# Patient Record
Sex: Male | Born: 1970 | Race: White | Hispanic: No | Marital: Married | Smoking: Former smoker
Health system: Southern US, Community
[De-identification: ages and names within clinical notes are randomized; demographics above are authoritative.]

## PROBLEM LIST (undated history)

## (undated) ENCOUNTER — Emergency Department (HOSPITAL_BASED_OUTPATIENT_CLINIC_OR_DEPARTMENT_OTHER): Admission: EM | Discharge status: Absent without leave | Payer: BC Managed Care – PPO

## (undated) DIAGNOSIS — G43909 Migraine, unspecified, not intractable, without status migrainosus: Secondary | ICD-10-CM

## (undated) DIAGNOSIS — M545 Low back pain, unspecified: Secondary | ICD-10-CM

## (undated) DIAGNOSIS — J45909 Unspecified asthma, uncomplicated: Secondary | ICD-10-CM

## (undated) DIAGNOSIS — C801 Malignant (primary) neoplasm, unspecified: Secondary | ICD-10-CM

## (undated) DIAGNOSIS — Z8619 Personal history of other infectious and parasitic diseases: Secondary | ICD-10-CM

## (undated) DIAGNOSIS — F418 Other specified anxiety disorders: Secondary | ICD-10-CM

## (undated) DIAGNOSIS — F1910 Other psychoactive substance abuse, uncomplicated: Secondary | ICD-10-CM

## (undated) DIAGNOSIS — T7840XA Allergy, unspecified, initial encounter: Secondary | ICD-10-CM

## (undated) DIAGNOSIS — I10 Essential (primary) hypertension: Secondary | ICD-10-CM

## (undated) DIAGNOSIS — J302 Other seasonal allergic rhinitis: Secondary | ICD-10-CM

## (undated) DIAGNOSIS — F32A Depression, unspecified: Secondary | ICD-10-CM

## (undated) DIAGNOSIS — Z9109 Other allergy status, other than to drugs and biological substances: Secondary | ICD-10-CM

## (undated) DIAGNOSIS — Z114 Encounter for screening for human immunodeficiency virus [HIV]: Secondary | ICD-10-CM

## (undated) DIAGNOSIS — F1010 Alcohol abuse, uncomplicated: Secondary | ICD-10-CM

## (undated) DIAGNOSIS — F419 Anxiety disorder, unspecified: Secondary | ICD-10-CM

## (undated) DIAGNOSIS — F329 Major depressive disorder, single episode, unspecified: Secondary | ICD-10-CM

## (undated) DIAGNOSIS — E786 Lipoprotein deficiency: Secondary | ICD-10-CM

## (undated) DIAGNOSIS — E785 Hyperlipidemia, unspecified: Secondary | ICD-10-CM

## (undated) DIAGNOSIS — M150 Primary generalized (osteo)arthritis: Secondary | ICD-10-CM

## (undated) HISTORY — DX: Other seasonal allergic rhinitis: J30.2

## (undated) HISTORY — DX: Essential (primary) hypertension: I10

## (undated) HISTORY — DX: Alcohol abuse, uncomplicated: F10.10

## (undated) HISTORY — DX: Major depressive disorder, single episode, unspecified: F32.9

## (undated) HISTORY — DX: Migraine, unspecified, not intractable, without status migrainosus: G43.909

## (undated) HISTORY — DX: Other psychoactive substance abuse, uncomplicated: F19.10

## (undated) HISTORY — DX: Anxiety disorder, unspecified: F41.9

## (undated) HISTORY — DX: Malignant (primary) neoplasm, unspecified: C80.1

## (undated) HISTORY — DX: Other specified anxiety disorders: F41.8

## (undated) HISTORY — PX: WISDOM TOOTH EXTRACTION: SHX21

## (undated) HISTORY — DX: Encounter for screening for human immunodeficiency virus (HIV): Z11.4

## (undated) HISTORY — DX: Depression, unspecified: F32.A

## (undated) HISTORY — PX: LUMBAR EPIDURAL INJECTION: SHX1980

## (undated) HISTORY — DX: Primary generalized (osteo)arthritis: M15.0

## (undated) HISTORY — DX: Allergy, unspecified, initial encounter: T78.40XA

## (undated) HISTORY — DX: Lipoprotein deficiency: E78.6

## (undated) HISTORY — DX: Other allergy status, other than to drugs and biological substances: Z91.09

## (undated) HISTORY — DX: Unspecified asthma, uncomplicated: J45.909

## (undated) HISTORY — DX: Hyperlipidemia, unspecified: E78.5

## (undated) HISTORY — DX: Personal history of other infectious and parasitic diseases: Z86.19

---

## 2016-11-07 ENCOUNTER — Ambulatory Visit (INDEPENDENT_AMBULATORY_CARE_PROVIDER_SITE_OTHER): Payer: BLUE CROSS/BLUE SHIELD | Admitting: Medical

## 2016-11-07 ENCOUNTER — Encounter: Payer: Self-pay | Admitting: Medical

## 2016-11-07 VITALS — BP 128/80 | HR 83 | Temp 98.0°F | Resp 16 | Ht 69.0 in | Wt 207.0 lb

## 2016-11-07 DIAGNOSIS — I10 Essential (primary) hypertension: Secondary | ICD-10-CM

## 2016-11-07 DIAGNOSIS — H547 Unspecified visual loss: Secondary | ICD-10-CM

## 2016-11-07 DIAGNOSIS — E785 Hyperlipidemia, unspecified: Secondary | ICD-10-CM

## 2016-11-07 DIAGNOSIS — Z111 Encounter for screening for respiratory tuberculosis: Secondary | ICD-10-CM | POA: Diagnosis not present

## 2016-11-07 DIAGNOSIS — F329 Major depressive disorder, single episode, unspecified: Secondary | ICD-10-CM | POA: Diagnosis not present

## 2016-11-07 DIAGNOSIS — F419 Anxiety disorder, unspecified: Secondary | ICD-10-CM | POA: Diagnosis not present

## 2016-11-07 DIAGNOSIS — F32A Depression, unspecified: Secondary | ICD-10-CM

## 2016-11-07 LAB — COMPREHENSIVE METABOLIC PANEL
ALBUMIN: 4.5 g/dL (ref 3.5–5.2)
ALT: 23 U/L (ref 0–53)
AST: 17 U/L (ref 0–37)
Alkaline Phosphatase: 68 U/L (ref 39–117)
BUN: 19 mg/dL (ref 6–23)
CHLORIDE: 102 meq/L (ref 96–112)
CO2: 31 meq/L (ref 19–32)
Calcium: 9.6 mg/dL (ref 8.4–10.5)
Creatinine, Ser: 1.02 mg/dL (ref 0.40–1.50)
GFR: 83.44 mL/min (ref >60.00)
GLUCOSE: 96 mg/dL (ref 70–99)
Potassium: 4.1 mEq/L (ref 3.5–5.1)
SODIUM: 139 meq/L (ref 135–145)
Total Bilirubin: 0.6 mg/dL (ref 0.2–1.2)
Total Protein: 7.3 g/dL (ref 6.0–8.3)

## 2016-11-07 LAB — LIPID PANEL
CHOL/HDL RATIO: 4
Cholesterol: 156 mg/dL (ref 0–200)
HDL: 35 mg/dL — ABNORMAL LOW (ref >39.00)
LDL CALC: 95 mg/dL (ref 0–99)
NonHDL: 120.5
Triglycerides: 128 mg/dL (ref 0.0–149.0)
VLDL: 25.6 mg/dL (ref 0.0–40.0)

## 2016-11-07 NOTE — Progress Notes (Signed)
Subjective:    Patient ID: George Ward, male    DOB: 04-03-1971, 46 y.o.   MRN: [id number]  HPI   Pt in for first time. He moved from [place] in December.   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 Triplets)  Htn- has been on medication for 5 years or so. Pt on diuretic. Has been 2 years since labs done.   Pt has high cholesterol- he is fasting. Has been on atorvastatin for 2 years.  Pt has history of asthma- He states rare use of ventolin. In past states more exercise induced.  Pt history of depression and anxiety. Pt states with medication and therapy has been in controlled. Pt saw psychiatrist in [place].   Pt states no diagnosis of bipolar. He has about one moth left on his prozac, seroquel and ativan.  Hx of back pain but no pain for 3 years used to get epidurals.   Pt has health exam form to be filled out for school he states. tyring to get employed as teacher,b  Review of Systems  Constitutional: Negative for chills, fatigue and fever.  Respiratory: Negative for cough and chest tightness.   Cardiovascular: Negative for chest pain.  Gastrointestinal: Negative for abdominal pain.  Musculoskeletal: Negative for back pain.  Skin: Negative for rash.  Neurological: Negative for dizziness and headaches.  Hematological: Negative for adenopathy. Does not bruise/bleed easily.  Psychiatric/Behavioral: Negative for behavioral problems, confusion, dysphoric mood, sleep disturbance and suicidal ideas. The patient is not nervous/anxious.        Mood stable and controlled.   Past Medical History:  Diagnosis Date  . Alcohol abuse    Remission >20 years ago  . Allergy   . Anxiety   . Asthma   . Depression   . Depression with anxiety   . Drug abuse    Remission >20 years ago  . Encounter for HIV (human immunodeficiency virus) test 2014-2015  . Environmental allergies   .  History of chicken pox   . Hyperlipidemia   . Hypertension   . Migraines   . Primary generalized (osteo)arthritis   . Seasonal allergies      Social History   Social History  . Marital status: Married    Spouse name: N/A  . Number of children: N/A  . Years of education: N/A   Occupational History  . Not on file.   Social History Main Topics  . Smoking status: Former Smoker    Types: Cigarettes    Quit date: 03/07/1988  . Smokeless tobacco: Former NeurosurgeonUser  . Alcohol use No  . Drug use: No  . Sexual activity: Yes   Other Topics Concern  . Not on file   Social History Narrative  . No narrative on file    Past Surgical History:  Procedure Laterality Date  . LUMBAR EPIDURAL INJECTION    . WISDOM TOOTH EXTRACTION  1989-1990    Family History  Problem Relation Age of Onset  . Hypertension Mother   . Hyperlipidemia Father   . Healthy Sister   . Colon cancer Maternal Grandfather   . Prostate cancer Maternal Grandfather   . Congestive Heart Failure Maternal Grandfather   . Dementia Paternal Grandmother   . Heart disease Paternal Grandfather   . Heart attack Paternal Grandfather   . Hypertension Maternal Uncle   . Hyperlipidemia Maternal Uncle   . Hepatitis Paternal Aunt   .  Hepatitis C Paternal Aunt   . Hypertension Paternal Uncle   . Hyperlipidemia Paternal Uncle   . Healthy Son     x1  . Healthy Daughter     x4    Allergies  Allergen Reactions  . Lisinopril Cough    With Wheezing     No current outpatient prescriptions on file prior to visit.   No current facility-administered medications on file prior to visit.     BP 128/80   Pulse 83   Temp 98 F (36.7 C) (Oral)   Resp 16   Ht 5\' 9"  (1.753 m)   Wt 207 lb (93.9 kg)   SpO2 100%   BMI 30.57 kg/m       Objective:   Physical Exam  General Mental Status- Alert. General Appearance- Not in acute distress.   Eyes- with glasses lt eye 20/50. Rt eye  20/20 both eyes 20/20.  Skin General:  Color- Normal Color. Moisture- Normal Moisture.  Neck Carotid Arteries- Normal color. Moisture- Normal Moisture. No carotid bruits. No JVD.  Chest and Lung Exam Auscultation: Breath Sounds:-Normal.  Cardiovascular Auscultation:Rythm- Regular. Murmurs & Other Heart Sounds:Auscultation of the heart reveals- No Murmurs.  Abdomen Inspection:-Inspeection Normal. Palpation/Percussion:Note:No mass. Palpation and Percussion of the abdomen reveal- Non Tender, Non Distended + BS, no rebound or guarding.    Neurologic Cranial Nerve exam:- CN III-XII intact(No nystagmus), symmetric smile. Drift Test:- No drift. Romberg Exam:- Negative.  Heal to Toe Gait exam:-Normal. Finger to Nose:- Normal/Intact Strength:- 5/5 equal and symmetric strength both upper and lower extremities.      Assessment & Plan:  For your htn(well controlled and hyperlipidemia) continue current meds. Will get cmp and lipid panel today.  For your depression and anxiety continue current meds. Referral to psychiatrist placed. List of providers given. Please contact them asap and then let Victorino Dike know which one you contacted.  Referred to optometrist. TB skin test given today.   Follow up date to be determined. Will fill out form on Friday when you get tb skin test read.  Total of 45 minutes spend with pt. In addition to time spent on chronic problems need to fill out majority of form for school PE.  Zahid Carneiro, Ramon Dredge, PA-C

## 2016-11-07 NOTE — Progress Notes (Signed)
Pre visit review using our clinic review tool, if applicable. No additional management support is needed unless otherwise documented below in the visit note/SLS  

## 2016-11-07 NOTE — Patient Instructions (Addendum)
For your htn(well controlled and hyperlipidemia) continue current meds. Will get cmp and lipid panel today.  For your depression and anxiety continue current meds. Referral to psychiatrist placed. List of providers given. Please contact them asap and then let George Ward know which one you contacted.  Referred to optometrist. TB skin test given today.   Follow up date to be determined. Will fill out form on Friday when you get tb skin test read.

## 2016-11-09 ENCOUNTER — Encounter: Payer: Self-pay | Admitting: *Deleted

## 2016-11-09 LAB — TB SKIN TEST: TB Skin Test: NEGATIVE

## 2016-12-05 ENCOUNTER — Ambulatory Visit (INDEPENDENT_AMBULATORY_CARE_PROVIDER_SITE_OTHER): Payer: BLUE CROSS/BLUE SHIELD | Admitting: Psychiatry

## 2016-12-05 VITALS — BP 122/74 | HR 89 | Ht 70.0 in | Wt 210.0 lb

## 2016-12-05 DIAGNOSIS — Z87891 Personal history of nicotine dependence: Secondary | ICD-10-CM | POA: Diagnosis not present

## 2016-12-05 DIAGNOSIS — Z810 Family history of intellectual disabilities: Secondary | ICD-10-CM | POA: Diagnosis not present

## 2016-12-05 DIAGNOSIS — Z79899 Other long term (current) drug therapy: Secondary | ICD-10-CM

## 2016-12-05 DIAGNOSIS — F3342 Major depressive disorder, recurrent, in full remission: Secondary | ICD-10-CM | POA: Diagnosis not present

## 2016-12-05 DIAGNOSIS — Z888 Allergy status to other drugs, medicaments and biological substances status: Secondary | ICD-10-CM

## 2016-12-05 DIAGNOSIS — F639 Impulse disorder, unspecified: Secondary | ICD-10-CM

## 2016-12-05 MED ORDER — FLUOXETINE HCL 40 MG PO CAPS: 80.0000 mg | ORAL_CAPSULE | Freq: Every day | ORAL | 3 refills | Status: DC

## 2016-12-05 MED ORDER — LORAZEPAM 0.5 MG PO TABS: 0.5000 mg | ORAL_TABLET | Freq: Two times a day (BID) | ORAL | 1 refills | Status: DC

## 2016-12-05 MED ORDER — QUETIAPINE FUMARATE 25 MG PO TABS: 25.0000 mg | ORAL_TABLET | Freq: Two times a day (BID) | ORAL | 3 refills | Status: DC

## 2016-12-05 NOTE — Progress Notes (Signed)
Psychiatric Initial Adult Assessment   Patient Identification: George MooreMark Ward MRN:  [id number] Date of Evaluation:  12/05/2016 Referral Source: self Chief Complaint:  depression, med management Visit Diagnosis:    ICD-9-CM ICD-10-CM   1. Recurrent major depressive disorder, in full remission (HCC) 296.36 F33.42   2. 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 He describes childhood history consistent with ADHD. Today for psychiatric intake assessment.  He reports that he and his wife, and their 5 children recently moved here from [place]. They moved in December 2017, and this was due to extremely stressful circumstances related to the patient, his wife, and his job in [place]. The patient was having an extramarital affair for 2 years leading up to summer 2017. 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 When this came to light, there were significant marital consequences, and fractured trust, in addition to significant consequences from the school. He was suspended from his job in October 2017. Eventually the school reached a settlement to allow him to resign in February 2018, but his teacher's license is currently still up for review, and may potentially be revoked in the future.  The patient reports that this is the second time he has had an extramarital affair, and the last time was in 2008. He reports that the marital affairs were in the setting of curiosity, boredom, and a sense that he might be able to get away with having this type of "adventure". He reports that his marriage with his wife has always been fairly good, and they have a good sexual and emotional relationship. They have never had any open agreements to allow each other to explore  other individuals sexually, and he knew what he was doing was going to be heartbreaking. He reports that this most recent affair, he thought that he was in love with that woman. Ultimately, he realizes that he made a series of very poor decisions over the past couple years. He expresses embarrassment and regret for his behavior, but specifically is more regretful of the fact that there were consequences that came about from his behavior. He speaks very little about the heartbreak that this caused his wife, to his children, and how this affected his relationship with his family.    He reports that he has struggled with depressive symptoms and behavioral acting out symptoms since he was a teenager. He reports that he always got very good grades in school, but he was also the class clown, liked to skip class, did drugs with his friends outside of school, got in frequent arguments and fights with his parents. He reports that his parents are very mild mannered, and generally kind people. He reports that they have a close relationship, but his parents remain in [place]. Moving here to [place] has been very difficult with him being away from his parents. Here in [place], the patient is developing more of a relationship with his wife's family, as she is from [place].  He denies any current significant depressive symptoms. He feels that his dose of Prozac 80 mg is effective for his mood symptoms. He uses Ativan 0.5 mg and Seroquel 25 mg at night for sleep. He has a morning dose to use as needed for both of those medications.  He denies any suicidal thoughts, but admits that back in October and November he was in a dark place, and wanted to just disappear from the earth. He never came up with any specific plan of how he would kill himself. He denies any such thoughts currently. He is more hopeful that his marriage may be okay, and he can repair the damage that's been done to his family. He and his wife  and 5 children are currently living with his sister-in-law and brother-in-law. The relationship with them has been strained, but it has been better than what he anticipated, given the circumstances. He and his wife and family are closing on a house in April, so will be moving out, but remaining in the [place] area.  Regarding work, the patient currently works as a Lawyer for [employer]. He is hopeful that he can find a full-time position, but eventually the outcome of his teacher license from [place], if negative, would preclude this. 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 He reports that his energy and ability to engage in his job recently has remained intact with the medicines that he is on. He reports that he sleeps well at night. His appetite is up and down, but part of this is related to moving to a new city and being near new food.  I spent time with the patient learning about his history of impulsivity, risk-taking behaviors. He admits that he has wondered if he has ADHD in the past. His son has ADHD, and is on medication for this. He has never been on any stimulant, or atypical medicine for ADHD such as Wellbutrin. He is open to considering these options in the future. He would like to establish therapy in this clinic. He is open to considering group therapy. He has previously engaged in intensive outpatient therapy in [place] with his previous psychiatrist office. He has never had any psychiatric hospitalizations. He does not engage in any substance use since college.  Associated Signs/Symptoms: Depression Symptoms:  in remission (Hypo) Manic Symptoms:  Impulsivity, Anxiety Symptoms:  none Psychotic Symptoms:  none PTSD Symptoms: Negative  Past Psychiatric History: Psychiatric history of outpatient treatment, intensive  outpatient treatment, and medication management. He had both individual therapy, and family therapy since childhood, and engaged in therapy in 2008, and most recently in 2016 to now  Previous Psychotropic Medications: Yes   Substance Abuse History in the last 12 months:  No.  Consequences of Substance Abuse: Negative  Past Medical History:  Past Medical History:  Diagnosis Date  . Alcohol abuse    Remission >20 years ago  . Allergy   . Anxiety   . Asthma   . Depression    MAJOR DEPRESSIVE DISORDER  . Depression with anxiety   . Drug abuse    Remission >20 years ago  . Encounter for HIV (human immunodeficiency virus) test 2014-2015  . Environmental allergies   . History of chicken pox   . Hyperlipidemia   . Hypertension   . Hypoalphalipoproteinemia, familial   . Migraines   . Primary generalized (osteo)arthritis   . Seasonal allergies     Past Surgical History:  Procedure Laterality Date  . LUMBAR EPIDURAL INJECTION    . WISDOM TOOTH EXTRACTION  1989-1990    Family Psychiatric History: none  Family History:  Family History  Problem Relation Age  of Onset  . Hypertension Mother   . Hyperlipidemia Father   . Healthy Sister   . Colon cancer Maternal Grandfather   . Prostate cancer Maternal Grandfather   . Congestive Heart Failure Maternal Grandfather   . Dementia Paternal Grandmother   . Heart disease Paternal Grandfather   . Heart attack Paternal Grandfather   . Hypertension Maternal Uncle   . Hyperlipidemia Maternal Uncle   . Hepatitis Paternal Aunt   . Hepatitis C Paternal Aunt   . Hypertension Paternal Uncle   . Hyperlipidemia Paternal Uncle   . Healthy Son     x1  . Healthy Daughter     x4    Social History:   Social History   Social History  . Marital status: Married    Spouse name: N/A  . Number of children: N/A  . Years of education: N/A   Social History Main Topics  . Smoking status: Former Smoker    Types: Cigarettes    Quit date:  03/07/1988  . Smokeless tobacco: Former Neurosurgeon  . Alcohol use No  . Drug use: No  . Sexual activity: Yes   Other Topics Concern  . Not on file   Social History Narrative  . No narrative on file    Additional Social History: Currently working as a Lawyer for [employer].  He has twin 36 year olds, and 47 year old triplets  Allergies:   Allergies  Allergen Reactions  . Co-Trimoxazole [Sulfamethoxazole-Trimethoprim] Nausea And Vomiting  . Lisinopril Cough    With Wheezing   . Lisinopril-Hydrochlorothiazide Other (See Comments) and Cough    MEDIUM SEVERITY    Metabolic Disorder Labs: No results found for: HGBA1C, MPG No results found for: PROLACTIN Lab Results  Component Value Date   CHOL 156 11/07/2016   TRIG 128.0 11/07/2016   HDL 35.00 (L) 11/07/2016   CHOLHDL 4 11/07/2016   VLDL 25.6 11/07/2016   LDLCALC 95 11/07/2016     Current Medications: Current Outpatient Prescriptions  Medication Sig Dispense Refill  . albuterol (PROVENTIL HFA;VENTOLIN HFA) 108 (90 Base) MCG/ACT inhaler Inhale 1-2 puffs into the lungs every 6 (six) hours as needed for wheezing or shortness of breath. Ventolin    . atorvastatin (LIPITOR) 20 MG tablet Take 20 mg by mouth daily.    Marland Kitchen FLUoxetine (PROZAC) 40 MG capsule Take 2 capsules (80 mg total) by mouth daily. 60 capsule 3  . LORazepam (ATIVAN) 0.5 MG tablet Take 1 tablet (0.5 mg total) by mouth 2 (two) times daily. 30 tablet 1  . QUEtiapine (SEROQUEL) 25 MG tablet Take 1 tablet (25 mg total) by mouth 2 (two) times daily. 60 tablet 3  . SUMAtriptan (IMITREX) 50 MG tablet Take 50 mg by mouth every 2 (two) hours as needed for migraine. May repeat in 2 hours if headache persists or recurs.    . triamterene-hydrochlorothiazide (MAXZIDE-25) 37.5-25 MG tablet Take 1 tablet by mouth daily.     No current facility-administered medications for this visit.     Neurologic: Headache: Negative Seizure:  Negative Paresthesias:Negative  Musculoskeletal: Strength & Muscle Tone: within normal limits Gait & Station: normal Patient leans: N/A  Psychiatric Specialty Exam: Review of Systems  Constitutional: Negative.   HENT: Negative.   Respiratory: Negative.   Cardiovascular: Negative.   Gastrointestinal: Negative.   Musculoskeletal: Negative.   Neurological: Negative.   Psychiatric/Behavioral: Negative.     Blood pressure 122/74, pulse 89, height 5\' 10"  (1.778 m), weight 210 lb (95.3 kg).Body mass index is 30.13 kg/m.  General Appearance: Casual and Fairly Groomed  Eye Contact:  Good  Speech:  Clear and Coherent  Volume:  Normal  Mood:  Euthymic  Affect:  Appropriate  Thought Process:  Coherent  Orientation:  Full (Time, Place, and Person)  Thought Content:  Logical  Suicidal Thoughts:  No  Homicidal Thoughts:  No  Memory:  Recent;   Good  Judgement:  Fair  Insight:  Shallow  Psychomotor Activity:  Normal  Concentration:  Concentration: Good  Recall:  NA  Fund of Knowledge:Good  Language: Good  Akathisia:  Negative  Handed:  Right  AIMS (if indicated):  na  Assets:  Communication Skills Desire for Improvement Financial Resources/Insurance Housing Physical Health Social Support Transportation Vocational/Educational  ADL's:  Intact  Cognition: WNL  Sleep:  8-9 hours   Treatment Plan Summary: Taksh Hjort is a 46 year old male with a long-standing psychiatric history of impulsivity since childhood, and a psychiatric history of major depressive disorder in adulthood. He presents today for psychiatric intake to establish care. 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 His teaching license currently remains under review and may potentially be revoked in the future. He  currently works as a Lawyer for the [employer] system.  He presents a history of impulsivity, and purposeful violation of the trust of others, but does seem to express remorse for his actions.  During our interview, he is more focused on the negative consequences, rather than the harm and hurt that he caused his family.  I wonder about characterologic or personality disordered features contributing to his presentation.  His mood symptoms have responded well to the current regimen. I wonder if he would benefit in the future from a medication such as an alpha modulator to reduce his impulsivity.  We will continue his current regimen for now, as we further understand his needs, and assist him and setting up therapy care.  1. Recurrent major depressive disorder, in full remission (HCC)   2. 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

## 2016-12-11 ENCOUNTER — Encounter: Payer: Self-pay | Admitting: Medical

## 2016-12-11 MED ORDER — ATORVASTATIN CALCIUM 20 MG PO TABS: 20.0000 mg | ORAL_TABLET | Freq: Every day | ORAL | 2 refills | Status: DC

## 2016-12-11 MED ORDER — TRIAMTERENE-HCTZ 37.5-25 MG PO TABS: 1.0000 | ORAL_TABLET | Freq: Every day | ORAL | 2 refills | Status: DC

## 2016-12-17 ENCOUNTER — Encounter: Payer: Self-pay | Admitting: Medical

## 2016-12-20 ENCOUNTER — Ambulatory Visit (INDEPENDENT_AMBULATORY_CARE_PROVIDER_SITE_OTHER): Payer: BLUE CROSS/BLUE SHIELD | Admitting: Medical

## 2016-12-20 ENCOUNTER — Encounter: Payer: Self-pay | Admitting: Medical

## 2016-12-20 VITALS — BP 127/91 | HR 97 | Temp 98.1°F | Resp 16 | Ht 69.0 in | Wt 204.6 lb

## 2016-12-20 DIAGNOSIS — L089 Local infection of the skin and subcutaneous tissue, unspecified: Secondary | ICD-10-CM

## 2016-12-20 DIAGNOSIS — L989 Disorder of the skin and subcutaneous tissue, unspecified: Secondary | ICD-10-CM

## 2016-12-20 MED ORDER — DOXYCYCLINE HYCLATE 100 MG PO TABS: 100.0000 mg | ORAL_TABLET | Freq: Two times a day (BID) | ORAL | 0 refills | Status: DC

## 2016-12-20 NOTE — Progress Notes (Signed)
Subjective:    Patient ID: George Ward, male    DOB: December 22, 1970, 46 y.o.   MRN: [id number]  HPI  Pt has history of skin lesion in 2014 which was cryofreezed. The area got better. But just recently over past few months lesion started to reappear.. Larger than originally and is tender. 4-5 moths since lesion reappeared and it is tender for 2-3 months.    Review of Systems  Constitutional: Negative for chills, fatigue and fever.  Respiratory: Negative for cough, chest tightness, shortness of breath and wheezing.   Cardiovascular: Negative for chest pain and palpitations.  Musculoskeletal: Negative for back pain.  Skin: Negative for rash.       See hpi.  Hematological: Negative for adenopathy. Does not bruise/bleed easily.  Psychiatric/Behavioral: Negative for behavioral problems and confusion.   Past Medical History:  Diagnosis Date  . Alcohol abuse    Remission >20 years ago  . Allergy   . Anxiety   . Asthma   . Depression    MAJOR DEPRESSIVE DISORDER  . Depression with anxiety   . Drug abuse    Remission >20 years ago  . Encounter for HIV (human immunodeficiency virus) test 2014-2015  . Environmental allergies   . History of chicken pox   . Hyperlipidemia   . Hypertension   . Hypoalphalipoproteinemia, familial   . Migraines   . Primary generalized (osteo)arthritis   . Seasonal allergies      Social History   Social History  . Marital status: Married    Spouse name: N/A  . Number of children: N/A  . Years of education: N/A   Occupational History  . Not on file.   Social History Main Topics  . Smoking status: Former Smoker    Types: Cigarettes    Quit date: 03/07/1988  . Smokeless tobacco: Former Neurosurgeon  . Alcohol use No  . Drug use: No  . Sexual activity: Yes   Other Topics Concern  . Not on file   Social History Narrative  . No narrative on file    Past Surgical History:  Procedure Laterality Date  . LUMBAR EPIDURAL INJECTION    . WISDOM TOOTH  EXTRACTION  1989-1990    Family History  Problem Relation Age of Onset  . Hypertension Mother   . Hyperlipidemia Father   . Healthy Sister   . Colon cancer Maternal Grandfather   . Prostate cancer Maternal Grandfather   . Congestive Heart Failure Maternal Grandfather   . Dementia Paternal Grandmother   . Heart disease Paternal Grandfather   . Heart attack Paternal Grandfather   . Hypertension Maternal Uncle   . Hyperlipidemia Maternal Uncle   . Hepatitis Paternal Aunt   . Hepatitis C Paternal Aunt   . Hypertension Paternal Uncle   . Hyperlipidemia Paternal Uncle   . Healthy Son     x1  . Healthy Daughter     x4    Allergies  Allergen Reactions  . Co-Trimoxazole [Sulfamethoxazole-Trimethoprim] Nausea And Vomiting  . Lisinopril Cough    With Wheezing   . Lisinopril-Hydrochlorothiazide Other (See Comments) and Cough    MEDIUM SEVERITY    Current Outpatient Prescriptions on File Prior to Visit  Medication Sig Dispense Refill  . albuterol (PROVENTIL HFA;VENTOLIN HFA) 108 (90 Base) MCG/ACT inhaler Inhale 1-2 puffs into the lungs every 6 (six) hours as needed for wheezing or shortness of breath. Ventolin    . atorvastatin (LIPITOR) 20 MG tablet Take 1 tablet (20 mg total) by  mouth daily. 30 tablet 2  . FLUoxetine (PROZAC) 40 MG capsule Take 2 capsules (80 mg total) by mouth daily. 60 capsule 3  . LORazepam (ATIVAN) 0.5 MG tablet Take 1 tablet (0.5 mg total) by mouth 2 (two) times daily. 30 tablet 1  . QUEtiapine (SEROQUEL) 25 MG tablet Take 1 tablet (25 mg total) by mouth 2 (two) times daily. 60 tablet 3  . SUMAtriptan (IMITREX) 50 MG tablet Take 50 mg by mouth every 2 (two) hours as needed for migraine. May repeat in 2 hours if headache persists or recurs.    . triamterene-hydrochlorothiazide (MAXZIDE-25) 37.5-25 MG tablet Take 1 tablet by mouth daily. 30 tablet 2   No current facility-administered medications on file prior to visit.     BP (!) 127/91 (BP Location: Left  Arm, Patient Position: Sitting, Cuff Size: Large)   Pulse 97   Temp 98.1 F (36.7 C) (Oral)   Resp 16   Ht  (1.753 m)   Wt 204 lb 9.6 oz (92.8 kg)   SpO2 99%   BMI 30.21 kg/m       Objective:   Physical Exam   General- No acute distress. Pleasant patient. Lungs- Clear, even and unlabored. Heart- regular rate and rhythm. Neurologic- CNII- XII grossly intact.  Skin- Rt side of face and lateral to nose. 12mm wide. Dark pigmented and slight tender. Slight irregular appearance to borderl      Assessment & Plan:  For skin infection will rx doxycycline antibiotic. Rx advisement given.  For hx of skin lesion and reoccurrence will go ahead and refer you to derm. I want you to call Victorino Dike in 7 days if no one has called you with appointment.  Follow up with me in 2-3 weeks if by chance dermatologist referral falls through.(sooner if needed as well)  Cas Tracz, Ramon Dredge, [employer]

## 2016-12-20 NOTE — Progress Notes (Signed)
Pre visit review using our clinic review tool, if applicable. No additional management support is needed unless otherwise documented below in the visit note. 

## 2016-12-20 NOTE — Patient Instructions (Signed)
For skin infection will rx doxycycline antibiotic. Rx advisement given.  For hx of skin lesion and reoccurrence will go ahead and refer you to derm. I want you to call Victorino Dike in 7 days if no one has called you with appointment.  Follow up with me in 2-3 weeks if by chance dermatologist referral falls through.(sooner if needed as well)

## 2016-12-26 ENCOUNTER — Ambulatory Visit (HOSPITAL_COMMUNITY): Payer: Self-pay | Admitting: Clinical

## 2017-01-10 ENCOUNTER — Ambulatory Visit (HOSPITAL_COMMUNITY): Payer: Self-pay | Admitting: Psychiatry

## 2017-01-17 ENCOUNTER — Encounter (HOSPITAL_COMMUNITY): Payer: Self-pay | Admitting: Psychiatry

## 2017-01-17 ENCOUNTER — Other Ambulatory Visit (HOSPITAL_COMMUNITY): Payer: Self-pay

## 2017-01-17 MED ORDER — LORAZEPAM 0.5 MG PO TABS: 0.5000 mg | ORAL_TABLET | Freq: Two times a day (BID) | ORAL | 1 refills | Status: DC

## 2017-01-22 ENCOUNTER — Ambulatory Visit (HOSPITAL_COMMUNITY): Payer: Self-pay | Admitting: Clinical

## 2017-02-14 ENCOUNTER — Ambulatory Visit (HOSPITAL_COMMUNITY): Payer: Self-pay | Admitting: Clinical

## 2017-02-27 ENCOUNTER — Ambulatory Visit (INDEPENDENT_AMBULATORY_CARE_PROVIDER_SITE_OTHER): Payer: BLUE CROSS/BLUE SHIELD | Admitting: Clinical

## 2017-02-27 ENCOUNTER — Encounter (HOSPITAL_COMMUNITY): Payer: Self-pay | Admitting: Clinical

## 2017-02-27 DIAGNOSIS — F3130 Bipolar disorder, current episode depressed, mild or moderate severity, unspecified: Secondary | ICD-10-CM
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
## 2017-03-06 ENCOUNTER — Encounter (HOSPITAL_COMMUNITY): Payer: Self-pay | Admitting: Psychiatry

## 2017-03-06 ENCOUNTER — Ambulatory Visit (INDEPENDENT_AMBULATORY_CARE_PROVIDER_SITE_OTHER): Payer: BLUE CROSS/BLUE SHIELD | Admitting: Psychiatry

## 2017-03-06 VITALS — BP 122/78 | HR 83 | Ht 69.0 in | Wt 209.4 lb

## 2017-03-06 DIAGNOSIS — F329 Major depressive disorder, single episode, unspecified: Secondary | ICD-10-CM | POA: Insufficient documentation

## 2017-03-06 DIAGNOSIS — G4700 Insomnia, unspecified: Secondary | ICD-10-CM

## 2017-03-06 DIAGNOSIS — F410 Panic disorder [episodic paroxysmal anxiety] without agoraphobia: Secondary | ICD-10-CM

## 2017-03-06 DIAGNOSIS — Z87891 Personal history of nicotine dependence: Secondary | ICD-10-CM | POA: Diagnosis not present

## 2017-03-06 DIAGNOSIS — F639 Impulse disorder, unspecified: Secondary | ICD-10-CM | POA: Insufficient documentation

## 2017-03-06 DIAGNOSIS — F3341 Major depressive disorder, recurrent, in partial remission: Secondary | ICD-10-CM | POA: Diagnosis not present

## 2017-03-06 DIAGNOSIS — F3342 Major depressive disorder, recurrent, in full remission: Secondary | ICD-10-CM | POA: Insufficient documentation

## 2017-03-06 DIAGNOSIS — F419 Anxiety disorder, unspecified: Secondary | ICD-10-CM

## 2017-03-06 DIAGNOSIS — Z79899 Other long term (current) drug therapy: Secondary | ICD-10-CM

## 2017-03-06 MED ORDER — QUETIAPINE FUMARATE 50 MG PO TABS
100.0000 mg | ORAL_TABLET | Freq: Every day | ORAL | 2 refills | Status: DC
Start: 2017-03-06 — End: 2017-08-13

## 2017-03-06 MED ORDER — FLUOXETINE HCL 40 MG PO CAPS: 80.0000 mg | ORAL_CAPSULE | Freq: Every day | ORAL | 3 refills | Status: DC

## 2017-03-06 MED ORDER — LORAZEPAM 0.5 MG PO TABS: 0.5000 mg | ORAL_TABLET | Freq: Two times a day (BID) | ORAL | 1 refills | Status: DC
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
## 2017-03-21 ENCOUNTER — Other Ambulatory Visit: Payer: Self-pay | Admitting: Medical

## 2017-03-25 ENCOUNTER — Encounter: Payer: Self-pay | Admitting: Medical

## 2017-03-25 MED ORDER — ATORVASTATIN CALCIUM 20 MG PO TABS: 20.0000 mg | ORAL_TABLET | Freq: Every day | ORAL | 0 refills | Status: DC

## 2017-04-06 ENCOUNTER — Other Ambulatory Visit: Payer: Self-pay | Admitting: Medical

## 2017-04-18 ENCOUNTER — Other Ambulatory Visit: Payer: Self-pay | Admitting: Medical

## 2017-04-19 NOTE — Telephone Encounter (Signed)
Patient scheduled with PCP for 04/24/2017

## 2017-04-19 NOTE — Telephone Encounter (Signed)
Pt due for follow up please call and schedule appointment.  

## 2017-04-24 ENCOUNTER — Ambulatory Visit: Payer: Self-pay | Admitting: Medical

## 2017-04-25 ENCOUNTER — Ambulatory Visit (INDEPENDENT_AMBULATORY_CARE_PROVIDER_SITE_OTHER): Payer: BLUE CROSS/BLUE SHIELD | Admitting: Medical

## 2017-04-25 VITALS — BP 127/91 | HR 86 | Temp 98.3°F | Resp 16 | Ht 69.0 in | Wt 206.0 lb

## 2017-04-25 DIAGNOSIS — E785 Hyperlipidemia, unspecified: Secondary | ICD-10-CM | POA: Diagnosis not present

## 2017-04-25 NOTE — Progress Notes (Signed)
Subjective:    Patient ID: George Ward, male    DOB: Apr 16, 1971, 46 y.o.   MRN: [id number]  HPI  Pt in for follow up.  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 Pt states first day of school May 06, 2017. 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  Pt just jointed gym. His cholesterol has been controlled. His hdl was low in past. On lipitor 20 mg a day. Lipid last checked 5 month ago.  Pt is seeing Dr Rene Kocher and pt states he like psychiatrist. He is trying to see therapist. Pt states mood good/stable.   Review of Systems  HENT: Negative for congestion, ear discharge and ear pain.   Cardiovascular: Negative for chest pain.  Gastrointestinal: Negative for abdominal distention, anal bleeding and blood in stool.  Genitourinary: Negative for decreased urine volume, discharge, dysuria, flank pain, frequency, hematuria, penile pain, penile swelling, testicular pain and urgency.  Musculoskeletal: Negative for back pain, joint swelling, myalgias and neck pain.  Skin: Negative for pallor and rash.  Hematological: Negative for adenopathy.  Psychiatric/Behavioral: Negative for behavioral problems, confusion, dysphoric mood, self-injury and suicidal ideas. The patient is not nervous/anxious.     Past Medical History:  Diagnosis Date  . Alcohol abuse    Remission >20 years ago  . Allergy   . Anxiety   . Asthma   . Depression    MAJOR DEPRESSIVE DISORDER  . Depression with anxiety   . Drug abuse    Remission >20 years ago  . Encounter for HIV (human immunodeficiency virus) test 2014-2015  . Environmental allergies   . History of chicken pox   . Hyperlipidemia   . Hypertension   . Hypoalphalipoproteinemia, familial   . Migraines   . Primary generalized (osteo)arthritis   . Seasonal allergies      Social History   Social History  . Marital status: Married    Spouse name: N/A  . Number of children: N/A  . Years of education: N/A    Occupational History  . Not on file.   Social History Main Topics  . Smoking status: Former Smoker    Types: Cigarettes    Quit date: 03/07/1988  . Smokeless tobacco: Former Neurosurgeon  . Alcohol use No  . Drug use: No  . Sexual activity: Yes   Other Topics Concern  . Not on file   Social History Narrative  . No narrative on file    Past Surgical History:  Procedure Laterality Date  . LUMBAR EPIDURAL INJECTION    . WISDOM TOOTH EXTRACTION  1989-1990    Family History  Problem Relation Age of Onset  . Hypertension Mother   . Hyperlipidemia Father   . Healthy Sister   . Colon cancer Maternal Grandfather   . Prostate cancer Maternal Grandfather   . Congestive Heart Failure Maternal Grandfather   . Dementia Paternal Grandmother   . Heart disease Paternal Grandfather   . Heart attack Paternal Grandfather   . Hypertension Maternal Uncle   . Hyperlipidemia Maternal Uncle   . Hepatitis Paternal Aunt   . Hepatitis C Paternal Aunt   . Hypertension Paternal Uncle   . Hyperlipidemia Paternal Uncle   . Healthy Son        x1  . Healthy Daughter        x4    Allergies  Allergen Reactions  . Co-Trimoxazole [Sulfamethoxazole-Trimethoprim] Nausea And Vomiting  . Lisinopril Cough    With  Wheezing   . Lisinopril-Hydrochlorothiazide Other (See Comments) and Cough    MEDIUM SEVERITY    Current Outpatient Prescriptions on File Prior to Visit  Medication Sig Dispense Refill  . albuterol (PROVENTIL HFA;VENTOLIN HFA) 108 (90 Base) MCG/ACT inhaler Inhale 1-2 puffs into the lungs every 6 (six) hours as needed for wheezing or shortness of breath. Ventolin    . atorvastatin (LIPITOR) 20 MG tablet TAKE 1 TABLET(20 MG) BY MOUTH DAILY 30 tablet 0  . FLUoxetine (PROZAC) 40 MG capsule Take 2 capsules (80 mg total) by mouth daily. 60 capsule 3  . LORazepam (ATIVAN) 0.5 MG tablet Take 1 tablet (0.5 mg total) by mouth 2 (two) times daily. 60 tablet 1  . QUEtiapine (SEROQUEL) 50 MG tablet Take  2 tablets (100 mg total) by mouth at bedtime. 60 tablet 2  . SUMAtriptan (IMITREX) 50 MG tablet Take 50 mg by mouth every 2 (two) hours as needed for migraine. May repeat in 2 hours if headache persists or recurs.    . triamterene-hydrochlorothiazide (MAXZIDE-25) 37.5-25 MG tablet TAKE 1 TABLET BY MOUTH DAILY 30 tablet 0   No current facility-administered medications on file prior to visit.     BP (!) 127/91   Pulse 86   Temp 98.3 F (36.8 C) (Oral)   Resp 16   Ht 5\' 9"  (1.753 m)   Wt 206 lb (93.4 kg)   SpO2 98%   BMI 30.42 kg/m       Objective:   Physical Exam  General Mental Status- Alert. General Appearance- Not in acute distress.   Skin General: Color- Normal Color. Moisture- Normal Moisture.  Neck Carotid Arteries- Normal color. Moisture- Normal Moisture. No carotid bruits. No JVD.  Chest and Lung Exam Auscultation: Breath Sounds:-Normal.  Cardiovascular Auscultation:Rythm- Regular. Murmurs & Other Heart Sounds:Auscultation of the heart reveals- No Murmurs.   Neurologic Cranial Nerve exam:- CN III-XII intact(No nystagmus), symmetric smile. Strength:- 5/5 equal and symmetric strength both upper and lower extremities.      Assessment & Plan:  For high cholesterol hx will put in cmp and lipid panel order today. Can get tomorrow in am fasting.  For depression and mood continue to see psychiatrist.  Follow up in 3-6 months or as needed.  Can schedule nurse flu vaccine visit in October.   Paton Crum, Ramon DredgeEdward, PA-C

## 2017-04-25 NOTE — Patient Instructions (Signed)
For high cholesterol hx will put in cmp and lipid panel order today. Can get tomorrow in am fasting.  For depression and mood continue to see psychiatrist.  Follow up in 3-6 months or as needed.  Can schedule nurse flu vaccine visit in October.

## 2017-04-26 ENCOUNTER — Other Ambulatory Visit (INDEPENDENT_AMBULATORY_CARE_PROVIDER_SITE_OTHER): Payer: BLUE CROSS/BLUE SHIELD

## 2017-04-26 DIAGNOSIS — E785 Hyperlipidemia, unspecified: Secondary | ICD-10-CM

## 2017-04-26 LAB — COMPREHENSIVE METABOLIC PANEL
ALT: 26 U/L (ref 0–53)
AST: 23 U/L (ref 0–37)
Albumin: 4.2 g/dL (ref 3.5–5.2)
Alkaline Phosphatase: 66 U/L (ref 39–117)
BUN: 20 mg/dL (ref 6–23)
CHLORIDE: 102 meq/L (ref 96–112)
CO2: 33 mEq/L — ABNORMAL HIGH (ref 19–32)
CREATININE: 1.15 mg/dL (ref 0.40–1.50)
Calcium: 9.6 mg/dL (ref 8.4–10.5)
GFR: 72.51 mL/min (ref >60.00)
Glucose, Bld: 99 mg/dL (ref 70–99)
Potassium: 3.9 mEq/L (ref 3.5–5.1)
SODIUM: 141 meq/L (ref 135–145)
Total Bilirubin: 0.9 mg/dL (ref 0.2–1.2)
Total Protein: 6.6 g/dL (ref 6.0–8.3)

## 2017-04-26 LAB — LIPID PANEL
CHOL/HDL RATIO: 5
CHOLESTEROL: 170 mg/dL (ref 0–200)
HDL: 35.2 mg/dL — ABNORMAL LOW (ref >39.00)
LDL CALC: 100 mg/dL — AB (ref 0–99)
NONHDL: 134.43
Triglycerides: 171 mg/dL — ABNORMAL HIGH (ref 0.0–149.0)
VLDL: 34.2 mg/dL (ref 0.0–40.0)

## 2017-04-27 ENCOUNTER — Telehealth: Payer: Self-pay | Admitting: Medical

## 2017-04-27 MED ORDER — ATORVASTATIN CALCIUM 20 MG PO TABS: ORAL_TABLET | ORAL | 1 refills | Status: DC

## 2017-04-27 NOTE — Telephone Encounter (Signed)
rx refill of lipitor sent in.

## 2017-05-02 ENCOUNTER — Other Ambulatory Visit: Payer: Self-pay | Admitting: Medical

## 2017-05-07 ENCOUNTER — Ambulatory Visit (HOSPITAL_COMMUNITY): Payer: Self-pay | Admitting: Clinical

## 2017-05-27 ENCOUNTER — Other Ambulatory Visit: Payer: Self-pay | Admitting: Medical

## 2017-06-06 ENCOUNTER — Ambulatory Visit (HOSPITAL_COMMUNITY): Payer: Self-pay | Admitting: Psychiatry

## 2017-06-24 ENCOUNTER — Other Ambulatory Visit: Payer: Self-pay | Admitting: Medical

## 2017-06-28 ENCOUNTER — Emergency Department (HOSPITAL_BASED_OUTPATIENT_CLINIC_OR_DEPARTMENT_OTHER)
Admission: EM | Admit: 2017-06-28 | Discharge: 2017-06-28 | Disposition: A | Discharge status: Home / Self Care | Payer: BLUE CROSS/BLUE SHIELD | Attending: Emergency Medicine | Admitting: Emergency Medicine

## 2017-06-28 ENCOUNTER — Encounter (HOSPITAL_BASED_OUTPATIENT_CLINIC_OR_DEPARTMENT_OTHER): Payer: Self-pay | Admitting: Emergency Medicine

## 2017-06-28 ENCOUNTER — Other Ambulatory Visit: Payer: Self-pay

## 2017-06-28 ENCOUNTER — Emergency Department (HOSPITAL_BASED_OUTPATIENT_CLINIC_OR_DEPARTMENT_OTHER): Payer: BLUE CROSS/BLUE SHIELD

## 2017-06-28 DIAGNOSIS — M79672 Pain in left foot: Secondary | ICD-10-CM

## 2017-06-28 DIAGNOSIS — Z79899 Other long term (current) drug therapy: Secondary | ICD-10-CM | POA: Diagnosis not present

## 2017-06-28 DIAGNOSIS — J45909 Unspecified asthma, uncomplicated: Secondary | ICD-10-CM | POA: Insufficient documentation

## 2017-06-28 DIAGNOSIS — Z87891 Personal history of nicotine dependence: Secondary | ICD-10-CM | POA: Insufficient documentation

## 2017-06-28 DIAGNOSIS — I10 Essential (primary) hypertension: Secondary | ICD-10-CM | POA: Diagnosis not present

## 2017-06-28 MED ORDER — NAPROXEN 500 MG PO TABS: 500.0000 mg | ORAL_TABLET | Freq: Two times a day (BID) | ORAL | 0 refills | Status: DC

## 2017-06-28 MED ORDER — NAPROXEN 250 MG PO TABS
ORAL_TABLET | ORAL | Status: AC
  Administered 2017-06-28: 500 mg
  Filled 2017-06-28: qty 2

## 2017-06-28 NOTE — ED Triage Notes (Signed)
Patient states that he had dress shoes on and had a bookcase fall onto his left foot. The patient reports that it is swollen today

## 2017-06-28 NOTE — Discharge Instructions (Signed)
Please read and follow all provided instructions.  You have been seen today for left foot pain  Tests performed today include: An x-ray of the affected area - does NOT show any broken bones or dislocations.  Vital signs. See below for your results today.   Home care instructions: -- *PRICE in the first 24-48 hours after injury: Protect (with brace, splint, sling), if given by your provider Rest Ice- Do not apply ice pack directly to your skin, place towel or similar between your skin and ice/ice pack. Apply ice for 20 min, then remove for 40 min while awake Compression- Wear brace, elastic bandage, splint as directed by your provider Elevate affected extremity above the level of your heart when not walking around for the first 24-48 hours   Use Naproxen with food as directed for pain.   Follow-up instructions: Please follow-up with your primary care provider or the provided orthopedic physician (bone specialist) if you continue to have significant pain in 1 week. In this case you may have a more severe injury that requires further care.   Return instructions:  Please return if your toes or feet are numb or tingling, appear gray or blue, or you have severe pain (also elevate the leg and loosen splint or wrap if you were given one) Please return to the Emergency Department if you experience worsening symptoms.  Please return if you have any other emergent concerns. Additional Information:  Your vital signs today were: BP (!) 142/90 (BP Location: Left Arm)    Pulse 84    Temp 98.7 F (37.1 C) (Oral)    Resp 18    Ht 5\' 10"  (1.778 m)    Wt 88.5 kg (195 lb)    SpO2 98%    BMI 27.98 kg/m  If your blood pressure (BP) was elevated above 135/85 this visit, please have this repeated by your doctor within one month. ---------------

## 2017-06-28 NOTE — ED Provider Notes (Signed)
[facility] EMERGENCY DEPARTMENT Provider Note   CSN: [id number] Arrival date & time: 06/28/17  1804     History   Chief Complaint Chief Complaint  Patient presents with  . Foot Pain    HPI George Ward is a 46 y.o. male presents to the emergency department today for left foot pain 1 day. Patient states that he was making a bookcase and it fell on his left foot. He is now having pain proximal to the first digit on his left foot. Pain is constant and worse with ambulation. Has taken ibuprofen for this with mild relief. Patient denies open wound, numbness, tingling or weakness.   HPI  Past Medical History:  Diagnosis Date  . Alcohol abuse    Remission >20 years ago  . Allergy   . Anxiety   . Asthma   . Depression    MAJOR DEPRESSIVE DISORDER  . Depression with anxiety   . Drug abuse (HCC)    Remission >20 years ago  . Encounter for HIV (human immunodeficiency virus) test 2014-2015  . Environmental allergies   . History of chicken pox   . Hyperlipidemia   . Hypertension   . Hypoalphalipoproteinemia, familial   . Migraines   . Primary generalized (osteo)arthritis   . Seasonal allergies     Patient Active Problem List   Diagnosis Date Noted  . 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 MDD (major depressive disorder) 03/06/2017    Past Surgical History:  Procedure Laterality Date  . LUMBAR EPIDURAL INJECTION    . WISDOM TOOTH EXTRACTION  1989-1990       Home Medications    Prior to Admission medications   Medication Sig Start Date End Date Taking? Authorizing Provider  albuterol (PROVENTIL HFA;VENTOLIN HFA) 108 (90 Base) MCG/ACT inhaler Inhale 1-2 puffs into the lungs every 6 (six) hours as needed for wheezing or shortness of breath. Ventolin    [provider]  atorvastatin (LIPITOR) 20 MG tablet TAKE 1 TABLET(20 MG) BY MOUTH DAILY 04/27/17   Saguier, Ramon Dredge, PA-C  FLUoxetine (PROZAC) 40 MG capsule Take 2 capsules (80 mg total) by  mouth daily. 03/06/17 03/06/18  Burnard Leigh, MD  LORazepam (ATIVAN) 0.5 MG tablet Take 1 tablet (0.5 mg total) by mouth 2 (two) times daily. 03/06/17   Burnard Leigh, MD  QUEtiapine (SEROQUEL) 50 MG tablet Take 2 tablets (100 mg total) by mouth at bedtime. 03/06/17   Eksir, Bo Mcclintock, MD  SUMAtriptan (IMITREX) 50 MG tablet Take 50 mg by mouth every 2 (two) hours as needed for migraine. May repeat in 2 hours if headache persists or recurs.    [provider]  triamterene-hydrochlorothiazide (MAXZIDE-25) 37.5-25 MG tablet TAKE 1 TABLET BY MOUTH DAILY 06/24/17   Saguier, Ramon Dredge, PA-C    Family History Family History  Problem Relation Age of Onset  . Hypertension Mother   . Hyperlipidemia Father   . Healthy Sister   . Colon cancer Maternal Grandfather   . Prostate cancer Maternal Grandfather   . Congestive Heart Failure Maternal Grandfather   . Dementia Paternal Grandmother   . Heart disease Paternal Grandfather   . Heart attack Paternal Grandfather   . Hypertension Maternal Uncle   . Hyperlipidemia Maternal Uncle   . Hepatitis Paternal Aunt   . Hepatitis C Paternal Aunt   . Hypertension Paternal Uncle   . Hyperlipidemia Paternal Uncle   . Healthy Son        x1  . Healthy Daughter  x4    Social History Social History  Substance Use Topics  . Smoking status: Former Smoker    Types: Cigarettes    Quit date: 03/07/1988  . Smokeless tobacco: Former Neurosurgeon  . Alcohol use No     Allergies   Co-trimoxazole [sulfamethoxazole-trimethoprim]; Lisinopril; and Lisinopril-hydrochlorothiazide   Review of Systems Review of Systems  Musculoskeletal: Positive for arthralgias (left foot).  Skin: Negative for wound.  Neurological: Negative for weakness and numbness.     Physical Exam Updated Vital Signs BP (!) 142/90 (BP Location: Left Arm)   Pulse 84   Temp 98.7 F (37.1 C) (Oral)   Resp 18   Ht 5\' 10"  (1.778 m)   Wt 88.5 kg (195 lb)   SpO2 98%    BMI 27.98 kg/m   Physical Exam  Constitutional: He appears well-developed and well-nourished.  HENT:  Head: Normocephalic and atraumatic.  Right Ear: External ear normal.  Left Ear: External ear normal.  Eyes: Conjunctivae are normal. Right eye exhibits no discharge. Left eye exhibits no discharge. No scleral icterus.  Cardiovascular:  Pulses:      Dorsalis pedis pulses are 2+ on the left side.       Posterior tibial pulses are 2+ on the left side.  Pulmonary/Chest: Effort normal. No respiratory distress.  Musculoskeletal: Normal range of motion.       Left ankle: He exhibits normal range of motion, no swelling, no ecchymosis, no deformity, no laceration and normal pulse. Achilles tendon exhibits no pain, no defect and normal Thompson's test results.       Left foot: There is tenderness. There is normal range of motion, no swelling and normal capillary refill.       Feet:  No bony tenderness at the posterior edge of the distal 6cm or the tip of the lateral malleolus or medial malleolus. No TTP of navicular bone or base of the 5th metatarsal.    Neurological: He is alert.  Skin: Skin is warm, dry and intact. Capillary refill takes less than 2 seconds. No abrasion, no bruising, no ecchymosis and no laceration noted. No pallor.  Psychiatric: He has a normal mood and affect.  Nursing note and vitals reviewed.    ED Treatments / Results  Labs (all labs ordered are listed, but only abnormal results are displayed) Labs Reviewed - No data to display  EKG  EKG Interpretation None       Radiology Dg Foot Complete Left  Result Date: 06/28/2017 CLINICAL DATA:  Dropped bookcase on foot EXAM: LEFT FOOT - COMPLETE 3+ VIEW COMPARISON:  None. FINDINGS: There is no evidence of fracture or dislocation. There is no evidence of arthropathy or other focal bone abnormality. Soft tissues are unremarkable. IMPRESSION: Negative. Electronically Signed   By: Deatra Robinson M.D.   On: 06/28/2017  19:07    Procedures Procedures (including critical care time)  Medications Ordered in ED Medications - No data to display   Initial Impression / Assessment and Plan / ED Course  I have reviewed the triage vital signs and the nursing notes.  Pertinent labs & imaging results that were available during my care of the patient were reviewed by me and considered in my medical decision making (see chart for details).     Patient X-Ray negative for obvious fracture or dislocation. Pain managed in ED. Pt advised to follow up with orthopedics if symptoms persist for possibility of missed fracture diagnosis. Patient given cam walker while in ED, conservative therapy recommended and  discussed. Patient will be dc home & is agreeable with above plan.  Final Clinical Impressions(s) / ED Diagnoses   Final diagnoses:  Left foot pain    New Prescriptions New Prescriptions   No medications on file     Princella PellegriniMaczis, Chane Cowden M, PA-C 06/28/17 2158    Lavera GuiseLiu, Dana Duo, MD 06/29/17 Jacinta [id number]

## 2017-07-09 ENCOUNTER — Ambulatory Visit (HOSPITAL_COMMUNITY): Payer: Self-pay | Admitting: Licensed Clinical Social Worker

## 2017-07-19 ENCOUNTER — Other Ambulatory Visit: Payer: Self-pay | Admitting: Medical

## 2017-07-29 ENCOUNTER — Ambulatory Visit (HOSPITAL_COMMUNITY): Payer: Self-pay | Admitting: Licensed Clinical Social Worker

## 2017-08-06 ENCOUNTER — Other Ambulatory Visit (HOSPITAL_COMMUNITY): Payer: Self-pay

## 2017-08-06 NOTE — Telephone Encounter (Signed)
Yes that's fine we can send 90 days

## 2017-08-06 NOTE — Telephone Encounter (Signed)
Medication refill request - Fax received from pt's [facility] for a refill of Fluoxetine, last ordered 03/06/17 + 3 refills and last filled at pharmacy 06/24/17.  Patient's next appt. 08/13/17.

## 2017-08-07 MED ORDER — FLUOXETINE HCL 40 MG PO CAPS: 80.0000 mg | ORAL_CAPSULE | Freq: Every day | ORAL | 0 refills | Status: DC

## 2017-08-13 ENCOUNTER — Ambulatory Visit (HOSPITAL_COMMUNITY): Payer: BLUE CROSS/BLUE SHIELD | Admitting: Psychiatry

## 2017-08-13 ENCOUNTER — Encounter (HOSPITAL_COMMUNITY): Payer: Self-pay | Admitting: Psychiatry

## 2017-08-13 ENCOUNTER — Telehealth (HOSPITAL_COMMUNITY): Payer: Self-pay | Admitting: Licensed Clinical Social Worker

## 2017-08-13 VITALS — BP 122/74 | HR 72 | Ht 70.0 in | Wt 200.2 lb

## 2017-08-13 DIAGNOSIS — F3341 Major depressive disorder, recurrent, in partial remission: Secondary | ICD-10-CM

## 2017-08-13 DIAGNOSIS — Z79899 Other long term (current) drug therapy: Secondary | ICD-10-CM | POA: Diagnosis not present

## 2017-08-13 DIAGNOSIS — Z87891 Personal history of nicotine dependence: Secondary | ICD-10-CM

## 2017-08-13 MED ORDER — LORAZEPAM 0.5 MG PO TABS: 0.5000 mg | ORAL_TABLET | Freq: Two times a day (BID) | ORAL | 1 refills | Status: DC

## 2017-08-13 MED ORDER — QUETIAPINE FUMARATE 50 MG PO TABS: 100.0000 mg | ORAL_TABLET | Freq: Every day | ORAL | 1 refills | Status: DC

## 2017-08-13 MED ORDER — FLUOXETINE HCL 40 MG PO CAPS: 80.0000 mg | ORAL_CAPSULE | Freq: Every day | ORAL | 1 refills | Status: DC

## 2017-08-13 NOTE — Progress Notes (Signed)
BH MD/PA/NP OP Progress Note  08/13/2017 9:22 AM George Ward  MRN:  [id number]  Chief Complaint: med management  HPI: George Ward reports that his mood has been fine overall.  He reports that he has been a bit exhausted with the new full-time job, and places much of his focus on trying to take care of his family and being there for his wife, improving himself at his new job.  His self-care has diminished a bit, with less emphasis on exercising and reading books to recharge his energy.  Discussed ways that we can support him in self-care, and discussed the utility of group therapy.  Given that many of his issues continue to be interpersonal.  No acute safety issues or substance use.  He continues on Prozac, Seroquel, and Ativan generally once a day as needed.   Notably, he and his wife continue to work on their relationship, and he continues to share significant grief and shame.  He reports that he has not stepped out of his marriage, and is committed to continue to work on the relationship.  Visit Diagnosis:    ICD-10-CM   1. Encounter for long-term (current) use of medications Z79.899 TSH    Vitamin D 1,25 dihydroxy    Testosterone,Free and Total    T4, free    T4, free    Testosterone,Free and Total    Vitamin D 1,25 dihydroxy    TSH  2. Recurrent major depressive disorder, in partial remission (HCC) F33.41 QUEtiapine (SEROQUEL) 50 MG tablet    FLUoxetine (PROZAC) 40 MG capsule    TSH    Vitamin D 1,25 dihydroxy    Testosterone,Free and Total    T4, free    T4, free    Testosterone,Free and Total    Vitamin D 1,25 dihydroxy    TSH    Past Psychiatric History: See intake H&P for full details. Reviewed, with no updates at this time.   Past Medical History:  Past Medical History:  Diagnosis Date  . Alcohol abuse    Remission >20 years ago  . Allergy   . Anxiety   . Asthma   . Depression    MAJOR DEPRESSIVE DISORDER  . Depression with anxiety   . Drug abuse (HCC)     Remission >20 years ago  . Encounter for HIV (human immunodeficiency virus) test 2014-2015  . Environmental allergies   . History of chicken pox   . Hyperlipidemia   . Hypertension   . Hypoalphalipoproteinemia, familial   . Migraines   . Primary generalized (osteo)arthritis   . Seasonal allergies     Past Surgical History:  Procedure Laterality Date  . LUMBAR EPIDURAL INJECTION    . WISDOM TOOTH EXTRACTION  1989-1990    Family Psychiatric History: See intake H&P for full details. Reviewed, with no updates at this time.   Family History:  Family History  Problem Relation Age of Onset  . Hypertension Mother   . Hyperlipidemia Father   . Healthy Sister   . Colon cancer Maternal Grandfather   . Prostate cancer Maternal Grandfather   . Congestive Heart Failure Maternal Grandfather   . Dementia Paternal Grandmother   . Heart disease Paternal Grandfather   . Heart attack Paternal Grandfather   . Hypertension Maternal Uncle   . Hyperlipidemia Maternal Uncle   . Hepatitis Paternal Aunt   . Hepatitis C Paternal Aunt   . Hypertension Paternal Uncle   . Hyperlipidemia Paternal Uncle   . Healthy Son  x1  . Healthy Daughter        x4    Social History:  Social History   Socioeconomic History  . Marital status: Married    Spouse name: None  . Number of children: None  . Years of education: None  . Highest education level: None  Social Needs  . Financial resource strain: None  . Food insecurity - worry: None  . Food insecurity - inability: None  . Transportation needs - medical: None  . Transportation needs - non-medical: None  Occupational History  . None  Tobacco Use  . Smoking status: Former Smoker    Types: Cigarettes    Last attempt to quit: 03/07/1988    Years since quitting: 29.4  . Smokeless tobacco: Former Engineer, water and Sexual Activity  . Alcohol use: No  . Drug use: No  . Sexual activity: Yes  Other Topics Concern  . None  Social History  Narrative  . None    Allergies:  Allergies  Allergen Reactions  . Co-Trimoxazole [Sulfamethoxazole-Trimethoprim] Nausea And Vomiting  . Lisinopril Cough    With Wheezing   . Lisinopril-Hydrochlorothiazide Other (See Comments) and Cough    MEDIUM SEVERITY    Metabolic Disorder Labs: No results found for: HGBA1C, MPG No results found for: PROLACTIN Lab Results  Component Value Date   CHOL 170 04/26/2017   TRIG 171.0 (H) 04/26/2017   HDL 35.20 (L) 04/26/2017   CHOLHDL 5 04/26/2017   VLDL 34.2 04/26/2017   LDLCALC 100 (H) 04/26/2017   LDLCALC 95 11/07/2016   No results found for: TSH  Therapeutic Level Labs: No results found for: LITHIUM No results found for: VALPROATE No components found for:  CBMZ  Current Medications: Current Outpatient Medications  Medication Sig Dispense Refill  . albuterol (PROVENTIL HFA;VENTOLIN HFA) 108 (90 Base) MCG/ACT inhaler Inhale 1-2 puffs into the lungs every 6 (six) hours as needed for wheezing or shortness of breath. Ventolin    . atorvastatin (LIPITOR) 20 MG tablet TAKE 1 TABLET(20 MG) BY MOUTH DAILY 90 tablet 1  . FLUoxetine (PROZAC) 40 MG capsule Take 2 capsules (80 mg total) by mouth daily. 180 capsule 1  . LORazepam (ATIVAN) 0.5 MG tablet Take 1 tablet (0.5 mg total) by mouth 2 (two) times daily. 60 tablet 1  . naproxen (NAPROSYN) 500 MG tablet Take 1 tablet (500 mg total) by mouth 2 (two) times daily. 30 tablet 0  . QUEtiapine (SEROQUEL) 50 MG tablet Take 2 tablets (100 mg total) by mouth at bedtime. 180 tablet 1  . SUMAtriptan (IMITREX) 50 MG tablet Take 50 mg by mouth every 2 (two) hours as needed for migraine. May repeat in 2 hours if headache persists or recurs.    . triamterene-hydrochlorothiazide (MAXZIDE-25) 37.5-25 MG tablet TAKE 1 TABLET BY MOUTH DAILY 30 tablet 2   No current facility-administered medications for this visit.      Musculoskeletal: Strength & Muscle Tone: within normal limits Gait & Station:  normal Patient leans: N/A  Psychiatric Specialty Exam: ROS  Blood pressure 122/74, pulse 72, height 5\' 10"  (1.778 m), weight 200 lb 3.2 oz (90.8 kg).Body mass index is 28.73 kg/m.  General Appearance: Casual and Fairly Groomed  Eye Contact:  Good  Speech:  Clear and Coherent  Volume:  Normal  Mood:  Euthymic  Affect:  Congruent  Thought Process:  Coherent, Goal Directed and Descriptions of Associations: Intact  Orientation:  Full (Time, Place, and Person)  Thought Content: Logical   Suicidal  Thoughts:  No  Homicidal Thoughts:  No  Memory:  Immediate;   Fair  Judgement:  Intact  Insight:  Fair  Psychomotor Activity:  Normal  Concentration:  Concentration: Good  Recall:  Good  Fund of Knowledge: Good  Language: Good  Akathisia:  Negative  Handed:  Right  AIMS (if indicated): not done  Assets:  Communication Skills Desire for Improvement Financial Resources/Insurance Housing  ADL's:  Intact  Cognition: WNL  Sleep:  Fair   Screenings:   Assessment and Plan:  George Ward presents with general mood stability, no acute safety issues.  He would benefit from participation in group therapy, and has been placing much of his focus lately on taking care of others, is diminishing focus on self-care and recharging.  He continues to struggle with grief about his actions.  We processed the ongoing difficulties between him and his wife.  Most of his stressors appear to be external, and the medications appear to be effective in preventing significant relapses in depression.  1. Encounter for long-term (current) use of medications   2. Recurrent major depressive disorder, in partial remission (HCC)     Status of current problems: stable  Labs Ordered: Orders Placed This Encounter  Procedures  . TSH    Standing Status:   Future    Number of Occurrences:   1    Standing Expiration Date:   08/13/2018  . Vitamin D 1,25 dihydroxy    Standing Status:   Future    Number of  Occurrences:   1    Standing Expiration Date:   08/13/2018  . Testosterone,Free and Total    Standing Status:   Future    Number of Occurrences:   1    Standing Expiration Date:   08/13/2018  . T4, free    Standing Status:   Future    Number of Occurrences:   1    Standing Expiration Date:   08/13/2018    Labs Reviewed: n/a  Collateral Obtained/Records Reviewed: n/a  Plan:  Continue fluoxetine 80 mg, Ativan 0.5 mg twice a day as needed, and Seroquel 100 mg nightly Laboratory studies as above to investigate for contributing factors to low energy and low sex drive  I spent 25 minutes with the patient in direct face-to-face clinical care.  Greater than 50% of this time was spent in counseling and coordination of care with the patient.    Burnard LeighAlexander Arya Reagen Goates, MD 08/13/2017, 9:22 AM

## 2017-08-13 NOTE — Telephone Encounter (Signed)
Left VM regarding his start of the Outpatient Skills group. Left return telephone number and asked pt to call this Clinical research associatewriter w/ any questions.

## 2017-08-14 ENCOUNTER — Ambulatory Visit (INDEPENDENT_AMBULATORY_CARE_PROVIDER_SITE_OTHER): Payer: BLUE CROSS/BLUE SHIELD | Admitting: Licensed Clinical Social Worker

## 2017-08-14 DIAGNOSIS — F3341 Major depressive disorder, recurrent, in partial remission: Secondary | ICD-10-CM | POA: Diagnosis not present

## 2017-08-15 ENCOUNTER — Telehealth: Payer: Self-pay | Admitting: Medical

## 2017-08-15 NOTE — Telephone Encounter (Signed)
Labs drawn by behavioral health.  Showed some low testosterone.  Patient should be following up with me within the next couple of months.  We will discuss labs with him and see if he wants to consider supplementation.  If so I would send the referral to endocrinologist to see if with his marginal low values would they treat?

## 2017-08-15 NOTE — Progress Notes (Signed)
Hey there, I wanted to put this on your radar.  I called the patient to discuss marginally low-T.  Would appreciate if he can follow-up with your clinic to consider supplementation if needed vs re-check.  We got the lab around 8:30 AM so it was approximately peak testosterone.   Best, Alex

## 2017-08-16 ENCOUNTER — Encounter (HOSPITAL_COMMUNITY): Payer: Self-pay | Admitting: Licensed Clinical Social Worker

## 2017-08-16 NOTE — Progress Notes (Signed)
  Weekly Group Progress Note  Program: OUTPATIENT SKILLS GROUP  Group Time: 5:30-6:30pm  Participation Level: Active  Behavioral Response: Appropriate and Sharing  Type of Therapy:  Psycho-education Group  Skills discussed: Conflict Resolution Skills   Summary of Progress: Pt was new to group and was mildly open and sharing. He states he wants to "get more strategies and coping skills" for continued management of "himself" but was vague as to his specific mental health issues. Pt discussed a conflict b/w him and a student who he cannot "get through to".   Summary of Group: Pts were active and engaged in session. One new member was present and group expectations were reviewed. Pts discussed their mental health symptoms and were taught steps to conflict resolution.   Margo CommonWesley E Swan, LPCA, LCASA

## 2017-08-19 ENCOUNTER — Encounter: Payer: Self-pay | Admitting: Medical

## 2017-08-19 LAB — VITAMIN D 1,25 DIHYDROXY
Vitamin D 1, 25 (OH)2 Total: 35 pg/mL
Vitamin D2 1, 25 (OH)2: <10 pg/mL
Vitamin D3 1, 25 (OH)2: 34 pg/mL

## 2017-08-19 LAB — TESTOSTERONE,FREE AND TOTAL
TESTOSTERONE FREE: 6.6 pg/mL — AB (ref 6.8–21.5)
TESTOSTERONE: 373 ng/dL (ref 264–916)

## 2017-08-19 LAB — T4, FREE: Free T4: 1.1 ng/dL (ref 0.82–1.77)

## 2017-08-19 LAB — TSH: TSH: 2.33 u[IU]/mL (ref 0.450–4.500)

## 2017-08-22 ENCOUNTER — Encounter: Payer: Self-pay | Admitting: Medical

## 2017-08-22 ENCOUNTER — Ambulatory Visit: Payer: BLUE CROSS/BLUE SHIELD | Admitting: Medical

## 2017-08-22 VITALS — BP 128/80 | HR 74 | Temp 97.5°F | Resp 16 | Wt 196.8 lb

## 2017-08-22 DIAGNOSIS — R0683 Snoring: Secondary | ICD-10-CM

## 2017-08-22 DIAGNOSIS — R5383 Other fatigue: Secondary | ICD-10-CM

## 2017-08-22 DIAGNOSIS — N529 Male erectile dysfunction, unspecified: Secondary | ICD-10-CM

## 2017-08-22 DIAGNOSIS — E785 Hyperlipidemia, unspecified: Secondary | ICD-10-CM

## 2017-08-22 LAB — CBC WITH DIFFERENTIAL/PLATELET
BASOS PCT: 0.6 % (ref 0.0–3.0)
Basophils Absolute: 0 10*3/uL (ref 0.0–0.1)
EOS ABS: 0.3 10*3/uL (ref 0.0–0.7)
Eosinophils Relative: 5.2 % — ABNORMAL HIGH (ref 0.0–5.0)
HEMATOCRIT: 44.5 % (ref 39.0–52.0)
Hemoglobin: 14.7 g/dL (ref 13.0–17.0)
LYMPHS PCT: 28 % (ref 12.0–46.0)
Lymphs Abs: 1.8 10*3/uL (ref 0.7–4.0)
MCHC: 33.1 g/dL (ref 30.0–36.0)
MCV: 87.7 fl (ref 78.0–100.0)
Monocytes Absolute: 0.4 10*3/uL (ref 0.1–1.0)
Monocytes Relative: 6 % (ref 3.0–12.0)
NEUTROS ABS: 3.9 10*3/uL (ref 1.4–7.7)
Neutrophils Relative %: 60.2 % (ref 43.0–77.0)
PLATELETS: 250 10*3/uL (ref 150.0–400.0)
RBC: 5.08 Mil/uL (ref 4.22–5.81)
RDW: 13.2 % (ref 11.5–15.5)
WBC: 6.5 10*3/uL (ref 4.0–10.5)

## 2017-08-22 LAB — LIPID PANEL
CHOL/HDL RATIO: 4
Cholesterol: 150 mg/dL (ref 0–200)
HDL: 39.9 mg/dL (ref >39.00)
LDL Cholesterol: 77 mg/dL (ref 0–99)
NONHDL: 109.67
TRIGLYCERIDES: 163 mg/dL — AB (ref 0.0–149.0)
VLDL: 32.6 mg/dL (ref 0.0–40.0)

## 2017-08-22 LAB — VITAMIN B12: Vitamin B-12: 1037 pg/mL — ABNORMAL HIGH (ref 211–911)

## 2017-08-22 MED ORDER — SILDENAFIL CITRATE 50 MG PO TABS: 50.0000 mg | ORAL_TABLET | Freq: Every day | ORAL | 0 refills | Status: DC | PRN

## 2017-08-22 NOTE — Progress Notes (Signed)
Subjective:    Patient ID: George Ward, male    DOB: May 23, 1971, 46 y.o.   MRN: [id number]  HPI  Pt in for evaluation(follow up from pschyhiatrist labs he drew. 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  Patient does report some low libido. Pt does report some erection difficulty. Rare erection. (pt has grandfather on mom side with prostate cancer diagnosis at age 47 year old).  Patient does not report any urinary obstructive type or UTI symptoms.  Pt has been feeling very fatigued. Poor sleep. Does snore occasionally. But pt does not regular snorer. Work up done partially by psych showed normal vitamin d and normal thyroid study.  Pt has some moderate work stress. 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 Appeals process. This is frustrating. He is seeing psychiatrist for his mood.  Pt has 5 biologic children. 2 adopted. No longer planning to have kids.  History of hyperlipidemia. Pt is fasting   Review of Systems  Constitutional: Positive for fatigue. Negative for chills and fever.  Respiratory: Negative for cough, chest tightness, shortness of breath and wheezing.        Snores at time.  Gastrointestinal: Negative for abdominal distention and anal bleeding.  Musculoskeletal: Negative for back pain.  Skin: Negative for rash.  Neurological: Negative for dizziness, syncope, numbness and headaches.  Hematological: Negative for adenopathy. Does not bruise/bleed easily.  Psychiatric/Behavioral: Positive for dysphoric mood. Negative for agitation, behavioral problems, confusion, sleep disturbance and suicidal ideas. The patient is not nervous/anxious.        Some related to work and pay stress.    Past Medical History:  Diagnosis Date  . Alcohol abuse    Remission >20 years ago  . Allergy   . Anxiety   . Asthma   . Depression    MAJOR DEPRESSIVE DISORDER  . Depression with anxiety   . Drug abuse (HCC)    Remission >20 years ago  . Encounter for HIV (human  immunodeficiency virus) test 2014-2015  . Environmental allergies   . History of chicken pox   . Hyperlipidemia   . Hypertension   . Hypoalphalipoproteinemia, familial   . Migraines   . Primary generalized (osteo)arthritis   . Seasonal allergies      Social History   Socioeconomic History  . Marital status: Married    Spouse name: Not on file  . Number of children: Not on file  . Years of education: Not on file  . Highest education level: Not on file  Social Needs  . Financial resource strain: Not on file  . Food insecurity - worry: Not on file  . Food insecurity - inability: Not on file  . Transportation needs - medical: Not on file  . Transportation needs - non-medical: Not on file  Occupational History  . Not on file  Tobacco Use  . Smoking status: Former Smoker    Types: Cigarettes    Last attempt to quit: 03/07/1988    Years since quitting: 29.4  . Smokeless tobacco: Former Engineer, waterUser  Substance and Sexual Activity  . Alcohol use: No  . Drug use: No  . Sexual activity: Yes  Other Topics Concern  . Not on file  Social History Narrative  . Not on file    Past Surgical History:  Procedure Laterality Date  . LUMBAR EPIDURAL INJECTION    . WISDOM TOOTH EXTRACTION  1989-1990    Family History  Problem Relation Age of Onset  . Hypertension Mother   .  Hyperlipidemia Father   . Healthy Sister   . Colon cancer Maternal Grandfather   . Prostate cancer Maternal Grandfather   . Congestive Heart Failure Maternal Grandfather   . Dementia Paternal Grandmother   . Heart disease Paternal Grandfather   . Heart attack Paternal Grandfather   . Hypertension Maternal Uncle   . Hyperlipidemia Maternal Uncle   . Hepatitis Paternal Aunt   . Hepatitis C Paternal Aunt   . Hypertension Paternal Uncle   . Hyperlipidemia Paternal Uncle   . Healthy Son        x1  . Healthy Daughter        x4    Allergies  Allergen Reactions  . Co-Trimoxazole [Sulfamethoxazole-Trimethoprim]  Nausea And Vomiting  . Lisinopril Cough    With Wheezing   . Lisinopril-Hydrochlorothiazide Other (See Comments) and Cough    MEDIUM SEVERITY    Current Outpatient Medications on File Prior to Visit  Medication Sig Dispense Refill  . albuterol (PROVENTIL HFA;VENTOLIN HFA) 108 (90 Base) MCG/ACT inhaler Inhale 1-2 puffs into the lungs every 6 (six) hours as needed for wheezing or shortness of breath. Ventolin    . atorvastatin (LIPITOR) 20 MG tablet TAKE 1 TABLET(20 MG) BY MOUTH DAILY 90 tablet 1  . FLUoxetine (PROZAC) 40 MG capsule Take 2 capsules (80 mg total) by mouth daily. 180 capsule 1  . LORazepam (ATIVAN) 0.5 MG tablet Take 1 tablet (0.5 mg total) by mouth 2 (two) times daily. 60 tablet 1  . naproxen (NAPROSYN) 500 MG tablet Take 1 tablet (500 mg total) by mouth 2 (two) times daily. 30 tablet 0  . QUEtiapine (SEROQUEL) 50 MG tablet Take 2 tablets (100 mg total) by mouth at bedtime. 180 tablet 1  . SUMAtriptan (IMITREX) 50 MG tablet Take 50 mg by mouth every 2 (two) hours as needed for migraine. May repeat in 2 hours if headache persists or recurs.    . triamterene-hydrochlorothiazide (MAXZIDE-25) 37.5-25 MG tablet TAKE 1 TABLET BY MOUTH DAILY 30 tablet 2   No current facility-administered medications on file prior to visit.     BP (!) 134/94   Pulse 74   Temp (!) 97.5 F (36.4 C) (Oral)   Resp 16   Wt 196 lb 12.8 oz (89.3 kg)   SpO2 99%   BMI 28.24 kg/m       Objective:   Physical Exam   General Mental Status- Alert. General Appearance- Not in acute distress.   Skin General: Color- Normal Color. Moisture- Normal Moisture.  Neck Carotid Arteries- Normal color. Moisture- Normal Moisture. No carotid bruits. No JVD.  Chest and Lung Exam Auscultation: Breath Sounds:-Normal.  Cardiovascular Auscultation:Rythm- Regular. Murmurs & Other Heart Sounds:Auscultation of the heart reveals- No Murmurs.  Abdomen Inspection:-Inspeection  Normal. Palpation/Percussion:Note:No mass. Palpation and Percussion of the abdomen reveal- Non Tender, Non Distended + BS, no rebound or guarding.    Neurologic Cranial Nerve exam:- CN III-XII intact(No nystagmus), symmetric smile. Strength:- 5/5 equal and symmetric strength both upper and lower extremities.     Assessment & Plan:  For your fatigue recently, I did order/expand workup with B12, B1 and CBC lab ordered today.  For high cholesterol we will repeat lipid panel.  For your low testosterone, low libido and erectile dysfunction, I am referring you to endocrinologist.  For the rectal dysfunction wrote prescription of Viagra.  For snoring I want you to ask your wife to monitor more closely to see if snoring is occurring more frequently.  Also noted if  you are waking up frequently.  If so then sleep study may be beneficial.  Follow-up date to be determined after lab review.  Please call here in a couple weeks to update of your referral if no one has called you by then.  Rolonda Pontarelli, Ramon DredgeEdward, PA-C

## 2017-08-22 NOTE — Patient Instructions (Addendum)
For your fatigue recently, I did order/expand workup with B12, B1 and CBC lab ordered today.  For high cholesterol we will repeat lipid panel.  For your low testosterone, low libido and erectile dysfunction, I am referring you to endocrinologist.  For the rectal dysfunction wrote prescription of Viagra.  For snoring I want you to ask your wife to monitor more closely to see if snoring is occurring more frequently.  Also noted if you are waking up frequently.  If so then sleep study may be beneficial.  Follow-up date to be determined after lab review.  Please call here in a couple weeks to update of your referral if no one has called you by then.

## 2017-08-23 ENCOUNTER — Telehealth: Payer: Self-pay | Admitting: Medical

## 2017-08-23 MED ORDER — DOXYCYCLINE HYCLATE 100 MG PO TABS: 100.0000 mg | ORAL_TABLET | Freq: Two times a day (BID) | ORAL | 0 refills | Status: DC

## 2017-08-23 NOTE — Telephone Encounter (Signed)
Called pt pharmacy and canceled prescription of doxycycline. Wrong pt.

## 2017-08-23 NOTE — Telephone Encounter (Signed)
Sent in doxycycline to patient's pharmacy.  No when I was in process of sending azithromycin I noticed per epic warning of possible QT prolongation with Seroquel.  So prescribe doxycycline instead.

## 2017-08-27 LAB — VITAMIN B1: Vitamin B1 (Thiamine): 24 nmol/L (ref 8–30)

## 2017-09-29 ENCOUNTER — Encounter: Payer: Self-pay | Admitting: Medical

## 2017-09-30 ENCOUNTER — Other Ambulatory Visit: Payer: Self-pay | Admitting: Medical

## 2017-09-30 MED ORDER — SUMATRIPTAN SUCCINATE 50 MG PO TABS: 50.0000 mg | ORAL_TABLET | ORAL | 1 refills | Status: DC | PRN

## 2017-10-08 ENCOUNTER — Other Ambulatory Visit: Payer: Self-pay | Admitting: Medical

## 2017-10-08 ENCOUNTER — Encounter: Payer: Self-pay | Admitting: Medical

## 2017-10-11 ENCOUNTER — Other Ambulatory Visit: Payer: Self-pay | Admitting: Medical

## 2017-10-14 ENCOUNTER — Encounter: Payer: Self-pay | Admitting: Medical

## 2017-10-14 MED ORDER — TRIAMTERENE-HCTZ 37.5-25 MG PO TABS: 1.0000 | ORAL_TABLET | Freq: Every day | ORAL | 1 refills | Status: DC

## 2017-10-15 ENCOUNTER — Encounter: Payer: Self-pay | Admitting: Medical

## 2017-11-04 ENCOUNTER — Ambulatory Visit (INDEPENDENT_AMBULATORY_CARE_PROVIDER_SITE_OTHER): Payer: BLUE CROSS/BLUE SHIELD | Admitting: Psychiatry

## 2017-11-04 ENCOUNTER — Encounter (HOSPITAL_COMMUNITY): Payer: Self-pay | Admitting: Psychiatry

## 2017-11-04 DIAGNOSIS — Z87891 Personal history of nicotine dependence: Secondary | ICD-10-CM

## 2017-11-04 DIAGNOSIS — F3341 Major depressive disorder, recurrent, in partial remission: Secondary | ICD-10-CM | POA: Diagnosis not present

## 2017-11-04 DIAGNOSIS — G4730 Sleep apnea, unspecified: Secondary | ICD-10-CM | POA: Diagnosis not present

## 2017-11-04 MED ORDER — QUETIAPINE FUMARATE 50 MG PO TABS: 100.0000 mg | ORAL_TABLET | Freq: Every day | ORAL | 1 refills | Status: DC

## 2017-11-04 MED ORDER — LORAZEPAM 0.5 MG PO TABS: 0.5000 mg | ORAL_TABLET | Freq: Two times a day (BID) | ORAL | 1 refills | Status: DC | PRN

## 2017-11-04 MED ORDER — FLUOXETINE HCL 40 MG PO CAPS: 80.0000 mg | ORAL_CAPSULE | Freq: Every day | ORAL | 1 refills | Status: DC

## 2017-11-04 NOTE — Progress Notes (Signed)
[facility] MD/PA/NP OP Progress Note  11/04/2017 8:30 AM George Ward  MRN:  [id number]  Chief Complaint: Med management HPI: George Ward is doing well overall.  Discussed some of his situational stressors at work.  No medication issues or intolerance.  Continues to struggle with periodic daytime headaches, snoring heavily in the evening and feeling sleepy during the day.  We discussed polysomnography.  We will follow-up in 3 months, continue current medications as below.  Visit Diagnosis:    ICD-10-CM   1. Sleep-disordered breathing G47.30 Home sleep test  2. Recurrent major depressive disorder, in partial remission (HCC) F33.41 QUEtiapine (SEROQUEL) 50 MG tablet    LORazepam (ATIVAN) 0.5 MG tablet    FLUoxetine (PROZAC) 40 MG capsule    Past Psychiatric History: See intake H&P for full details. Reviewed, with no updates at this time.   Past Medical History:  Past Medical History:  Diagnosis Date  . Alcohol abuse    Remission >20 years ago  . Allergy   . Anxiety   . Asthma   . Depression    MAJOR DEPRESSIVE DISORDER  . Depression with anxiety   . Drug abuse (HCC)    Remission >20 years ago  . Encounter for HIV (human immunodeficiency virus) test 2014-2015  . Environmental allergies   . History of chicken pox   . Hyperlipidemia   . Hypertension   . Hypoalphalipoproteinemia, familial   . Migraines   . Primary generalized (osteo)arthritis   . Seasonal allergies     Past Surgical History:  Procedure Laterality Date  . LUMBAR EPIDURAL INJECTION    . WISDOM TOOTH EXTRACTION  1989-1990    Family Psychiatric History: See intake H&P for full details. Reviewed, with no updates at this time.   Family History:  Family History  Problem Relation Age of Onset  . Hypertension Mother   . Hyperlipidemia Father   . Healthy Sister   . Colon cancer Maternal Grandfather   . Prostate cancer Maternal Grandfather   . Congestive Heart Failure Maternal Grandfather   . Dementia Paternal  Grandmother   . Heart disease Paternal Grandfather   . Heart attack Paternal Grandfather   . Hypertension Maternal Uncle   . Hyperlipidemia Maternal Uncle   . Hepatitis Paternal Aunt   . Hepatitis C Paternal Aunt   . Hypertension Paternal Uncle   . Hyperlipidemia Paternal Uncle   . Healthy Son        x1  . Healthy Daughter        x4    Social History:  Social History   Socioeconomic History  . Marital status: Married    Spouse name: Not on file  . Number of children: Not on file  . Years of education: Not on file  . Highest education level: Not on file  Social Needs  . Financial resource strain: Not on file  . Food insecurity - worry: Not on file  . Food insecurity - inability: Not on file  . Transportation needs - medical: Not on file  . Transportation needs - non-medical: Not on file  Occupational History  . Not on file  Tobacco Use  . Smoking status: Former Smoker    Types: Cigarettes    Last attempt to quit: 03/07/1988    Years since quitting: 29.6  . Smokeless tobacco: Former Engineer, water and Sexual Activity  . Alcohol use: No  . Drug use: No  . Sexual activity: Yes  Other Topics Concern  . Not on file  Social History Narrative  . Not on file    Allergies:  Allergies  Allergen Reactions  . Co-Trimoxazole [Sulfamethoxazole-Trimethoprim] Nausea And Vomiting  . Lisinopril Cough    With Wheezing   . Lisinopril-Hydrochlorothiazide Other (See Comments) and Cough    MEDIUM SEVERITY    Metabolic Disorder Labs: No results found for: HGBA1C, MPG No results found for: PROLACTIN Lab Results  Component Value Date   CHOL 150 08/22/2017   TRIG 163.0 (H) 08/22/2017   HDL 39.90 08/22/2017   CHOLHDL 4 08/22/2017   VLDL 32.6 08/22/2017   LDLCALC 77 08/22/2017   LDLCALC 100 (H) 04/26/2017   Lab Results  Component Value Date   TSH 2.330 08/13/2017    Therapeutic Level Labs: No results found for: LITHIUM No results found for: VALPROATE No components  found for:  CBMZ  Current Medications: Current Outpatient Medications  Medication Sig Dispense Refill  . albuterol (PROVENTIL HFA;VENTOLIN HFA) 108 (90 Base) MCG/ACT inhaler Inhale 1-2 puffs into the lungs every 6 (six) hours as needed for wheezing or shortness of breath. Ventolin    . atorvastatin (LIPITOR) 20 MG tablet TAKE 1 TABLET(20 MG) BY MOUTH DAILY 90 tablet 1  . FLUoxetine (PROZAC) 40 MG capsule Take 2 capsules (80 mg total) by mouth daily. 180 capsule 1  . LORazepam (ATIVAN) 0.5 MG tablet Take 1 tablet (0.5 mg total) by mouth 2 (two) times daily as needed for anxiety. 60 tablet 1  . naproxen (NAPROSYN) 500 MG tablet Take 1 tablet (500 mg total) by mouth 2 (two) times daily. 30 tablet 0  . QUEtiapine (SEROQUEL) 50 MG tablet Take 2 tablets (100 mg total) by mouth at bedtime. 180 tablet 1  . sildenafil (VIAGRA) 50 MG tablet Take 1 tablet (50 mg total) by mouth daily as needed for erectile dysfunction. 10 tablet 0  . SUMAtriptan (IMITREX) 50 MG tablet Take 1 tablet (50 mg total) by mouth every 2 (two) hours as needed for migraine. May repeat in 2 hours if headache persists or recurs. 10 tablet 1  . triamterene-hydrochlorothiazide (MAXZIDE-25) 37.5-25 MG tablet Take 1 tablet by mouth daily. 30 tablet 1   No current facility-administered medications for this visit.      Musculoskeletal: Strength & Muscle Tone: within normal limits Gait & Station: normal Patient leans: N/A  Psychiatric Specialty Exam: ROS  There were no vitals taken for this visit.There is no height or weight on file to calculate BMI.  General Appearance: Casual and Well Groomed  Eye Contact:  Good  Speech:  Clear and Coherent and Normal Rate  Volume:  Normal  Mood:  Euthymic  Affect:  Appropriate and Congruent  Thought Process:  Goal Directed and Descriptions of Associations: Intact  Orientation:  Full (Time, Place, and Person)  Thought Content: Logical   Suicidal Thoughts:  No  Homicidal Thoughts:  No   Memory:  Immediate;   Fair  Judgement:  Good  Insight:  Good  Psychomotor Activity:  Normal  Concentration:  Concentration: Good  Recall:  Good  Fund of Knowledge: Good  Language: Good  Akathisia:  Negative  Handed:  Right  AIMS (if indicated): not done  Assets:  Communication Skills Desire for Improvement Financial Resources/Insurance Housing Intimacy Leisure Time Physical Health Resilience Social Support Talents/Skills Transportation Vocational/Educational  ADL's:  Intact  Cognition: WNL  Sleep:  snoring, poor quality   Screenings:   Assessment and Plan:  Nyxon Caul presents with overall stability of depressive symptoms.  Primary concern today is related to  ongoing difficulties with restful sleep, complicated by snoring.  It Seroquel does help with sleep onset and maintenance, but he wakes up feeling fatigued.  We will proceed as below and follow-up in 3 months.  1. Sleep-disordered breathing   2. Recurrent major depressive disorder, in partial remission (HCC)     Status of current problems: stable  Labs Ordered: Orders Placed This Encounter  Procedures  . Home sleep test    Standing Status:   Future    Standing Expiration Date:   11/04/2018    Order Specific Question:   Where should this test be performed:    Answer[facility]    Labs Reviewed: N/A  Collateral Obtained/Records Reviewed: N/A  Plan:  Home sleep study ordered Continue Prozac 80 mg Ativan 0.5 mg twice a day as needed Continue Seroquel 50-100 mg nightly  I spent 20 minutes with the patient in direct face-to-face clinical care.  Greater than 50% of this time was spent in counseling and coordination of care with the patient.    Burnard LeighAlexander Arya Eksir, MD 11/04/2017, 8:30 AM

## 2017-11-13 ENCOUNTER — Encounter: Payer: Self-pay | Admitting: Medical

## 2017-11-13 ENCOUNTER — Encounter (HOSPITAL_COMMUNITY): Payer: Self-pay | Admitting: Psychiatry

## 2017-11-13 MED ORDER — TRIAMTERENE-HCTZ 37.5-25 MG PO TABS: 1.0000 | ORAL_TABLET | Freq: Every day | ORAL | 3 refills | Status: DC

## 2017-11-13 NOTE — Telephone Encounter (Signed)
Fyi, not sure if he needs refills, etc or if he is just updating us.

## 2017-11-22 ENCOUNTER — Encounter: Payer: Self-pay | Admitting: Medical

## 2017-11-22 ENCOUNTER — Encounter (HOSPITAL_BASED_OUTPATIENT_CLINIC_OR_DEPARTMENT_OTHER): Payer: Self-pay

## 2017-11-22 ENCOUNTER — Encounter (HOSPITAL_COMMUNITY): Payer: Self-pay | Admitting: Psychiatry

## 2017-11-27 ENCOUNTER — Encounter: Payer: Self-pay | Admitting: Medical

## 2017-12-19 ENCOUNTER — Ambulatory Visit (HOSPITAL_BASED_OUTPATIENT_CLINIC_OR_DEPARTMENT_OTHER)
Admission: RE | Admit: 2017-12-19 | Discharge: 2017-12-19 | Disposition: A | Discharge status: Home / Self Care | Payer: BLUE CROSS/BLUE SHIELD | Source: Ambulatory Visit | Attending: Medical | Admitting: Medical

## 2017-12-19 ENCOUNTER — Ambulatory Visit: Payer: BLUE CROSS/BLUE SHIELD | Admitting: Medical

## 2017-12-19 ENCOUNTER — Encounter: Payer: Self-pay | Admitting: Medical

## 2017-12-19 VITALS — BP 115/89 | HR 74 | Temp 98.1°F | Resp 16 | Ht 69.0 in | Wt 201.0 lb

## 2017-12-19 DIAGNOSIS — K429 Umbilical hernia without obstruction or gangrene: Secondary | ICD-10-CM

## 2017-12-19 DIAGNOSIS — R109 Unspecified abdominal pain: Secondary | ICD-10-CM | POA: Diagnosis not present

## 2017-12-19 DIAGNOSIS — M25561 Pain in right knee: Secondary | ICD-10-CM

## 2017-12-19 NOTE — Progress Notes (Signed)
Subjective:    Patient ID: George Ward, male    DOB: [birth year], 47 y.o.   MRN: [id number]  HPI  Pt in with some abdomen pain around his umbilical area. On and off pain over past  3 months. Worse over past month worse pain. Then this week some moderate pain. No nausea or vomiting. No fever, no chills or sweats.  Pt states recently the umbilicus area more prominent than usual.  Also at the end of the interview patient reports pain and stiffness to rt  knee. No fall or trauma. Pain for 2 months. Sometimes if puts pressure on rt knee pain increases.  Review of Systems  HENT: Negative for congestion, ear discharge, facial swelling and mouth sores.   Respiratory: Negative for cough, choking, shortness of breath and wheezing.   Cardiovascular: Negative for chest pain and palpitations.  Gastrointestinal: Positive for abdominal pain. Negative for abdominal distention, blood in stool, constipation and diarrhea.       Mild umblical pain. Mild slight decrease appetite up and down.  Genitourinary: Negative for dysuria, flank pain, frequency and penile swelling.  Musculoskeletal: Negative for back pain.       Rt knee pain for couple of month.  Skin: Negative for color change and rash.  Neurological: Negative for dizziness, syncope, weakness and light-headedness.  Hematological: Negative for adenopathy. Does not bruise/bleed easily.  Psychiatric/Behavioral: Negative for behavioral problems, confusion, hallucinations and sleep disturbance. The patient is not nervous/anxious.     Past Medical History:  Diagnosis Date  . Alcohol abuse    Remission >20 years ago  . Allergy   . Anxiety   . Asthma   . Depression    MAJOR DEPRESSIVE DISORDER  . Depression with anxiety   . Drug abuse (HCC)    Remission >20 years ago  . Encounter for HIV (human immunodeficiency virus) test 2014-2015  . Environmental allergies   . History of chicken pox   . Hyperlipidemia   . Hypertension   .  Hypoalphalipoproteinemia, familial   . Migraines   . Primary generalized (osteo)arthritis   . Seasonal allergies      Social History   Socioeconomic History  . Marital status: Married    Spouse name: Not on file  . Number of children: Not on file  . Years of education: Not on file  . Highest education level: Not on file  Occupational History  . Not on file  Social Needs  . Financial resource strain: Not on file  . Food insecurity:    Worry: Not on file    Inability: Not on file  . Transportation needs:    Medical: Not on file    Non-medical: Not on file  Tobacco Use  . Smoking status: Former Smoker    Types: Cigarettes    Last attempt to quit: 03/07/1988    Years since quitting: 29.8  . Smokeless tobacco: Former Engineer, water and Sexual Activity  . Alcohol use: No  . Drug use: No  . Sexual activity: Yes  Lifestyle  . Physical activity:    Days per week: Not on file    Minutes per session: Not on file  . Stress: Not on file  Relationships  . Social connections:    Talks on phone: Not on file    Gets together: Not on file    Attends religious service: Not on file    Active member of club or organization: Not on file    Attends meetings of clubs  or organizations: Not on file    Relationship status: Not on file  . Intimate partner violence:    Fear of current or ex partner: Not on file    Emotionally abused: Not on file    Physically abused: Not on file    Forced sexual activity: Not on file  Other Topics Concern  . Not on file  Social History Narrative  . Not on file    Past Surgical History:  Procedure Laterality Date  . LUMBAR EPIDURAL INJECTION    . WISDOM TOOTH EXTRACTION  1989-1990    Family History  Problem Relation Age of Onset  . Hypertension Mother   . Hyperlipidemia Father   . Healthy Sister   . Colon cancer Maternal Grandfather   . Prostate cancer Maternal Grandfather   . Congestive Heart Failure Maternal Grandfather   . Dementia  Paternal Grandmother   . Heart disease Paternal Grandfather   . Heart attack Paternal Grandfather   . Hypertension Maternal Uncle   . Hyperlipidemia Maternal Uncle   . Hepatitis Paternal Aunt   . Hepatitis C Paternal Aunt   . Hypertension Paternal Uncle   . Hyperlipidemia Paternal Uncle   . Healthy Son        x1  . Healthy Daughter        x4    Allergies  Allergen Reactions  . Co-Trimoxazole [Sulfamethoxazole-Trimethoprim] Nausea And Vomiting  . Lisinopril Cough    With Wheezing   . Lisinopril-Hydrochlorothiazide Other (See Comments) and Cough    MEDIUM SEVERITY    Current Outpatient Medications on File Prior to Visit  Medication Sig Dispense Refill  . albuterol (PROVENTIL HFA;VENTOLIN HFA) 108 (90 Base) MCG/ACT inhaler Inhale 1-2 puffs into the lungs every 6 (six) hours as needed for wheezing or shortness of breath. Ventolin    . atorvastatin (LIPITOR) 20 MG tablet TAKE 1 TABLET(20 MG) BY MOUTH DAILY 90 tablet 1  . FLUoxetine (PROZAC) 40 MG capsule Take 2 capsules (80 mg total) by mouth daily. 180 capsule 1  . LORazepam (ATIVAN) 0.5 MG tablet Take 1 tablet (0.5 mg total) by mouth 2 (two) times daily as needed for anxiety. 60 tablet 1  . naproxen (NAPROSYN) 500 MG tablet Take 1 tablet (500 mg total) by mouth 2 (two) times daily. 30 tablet 0  . QUEtiapine (SEROQUEL) 50 MG tablet Take 2 tablets (100 mg total) by mouth at bedtime. 180 tablet 1  . sildenafil (VIAGRA) 50 MG tablet Take 1 tablet (50 mg total) by mouth daily as needed for erectile dysfunction. 10 tablet 0  . SUMAtriptan (IMITREX) 50 MG tablet Take 1 tablet (50 mg total) by mouth every 2 (two) hours as needed for migraine. May repeat in 2 hours if headache persists or recurs. 10 tablet 1  . triamterene-hydrochlorothiazide (MAXZIDE-25) 37.5-25 MG tablet Take 1 tablet by mouth daily. 30 tablet 3   No current facility-administered medications on file prior to visit.     BP 115/89   Pulse 74   Temp 98.1 F (36.7 C)  (Oral)   Resp 16   Ht  (1.753 m)   Wt 201 lb (91.2 kg)   SpO2 98%   BMI 29.68 kg/m      Objective:   Physical Exam  General Appearance- Not in acute distress.  HEENT Eyes- Scleraeral/Conjuntiva-bilat- Not Yellow. Mouth & Throat- Normal.  Chest and Lung Exam Auscultation: Breath sounds:-Normal. Adventitious sounds:- No Adventitious sounds.  Cardiovascular Auscultation:Rythm - Regular. Heart Sounds -Normal heart sounds.  Abdomen Inspection:-Inspection Normal.  Palpation/Perucssion: Palpation and Percussion of the abdomen reveal- has small- moderate sized umbilical hernia that reduces on palpation/pressure but then on standing reoccurs easily, No Rebound tenderness, No rigidity(Guarding) and No Palpable abdominal masses.  Liver:-Normal.  Spleen:- Normal.   Back- no cva tenderness.  Rt knee- on palpation of rt knee/border of patella mild inflammed area. Beneath this area feel like free floating very small mass. Feel amorphous and about 3mm x 2mm.     Assessment & Plan:  You do appear to have moderate sized umbilical hernia.  You could try some low-dose ibuprofen 200-400 mg for mild to moderate pain.  However since the area has increased in size do think it would be best to get you in to see general surgeon for opinion regarding surgical treatment.  Please get CBC and metabolic panel today.  We will see if you have any elevated infection fighting cells or any other abnormalities.   Sometimes hernias can have rare but serious complications.  If you get any severe increasing pain with other symptoms as discussed then be seen in the emergency department.  I did go ahead and place referral to general surgeon.  If you have not got a call regarding appointment by next Wednesday please call our office for an update.  Regarding your right knee pain recently, I do want to get x-ray of the knee to assess the palpable abnormality in the superior portion of your patella.  If no cause  found and mild pain persist might consider referral to sports medicine.  A low dose ibuprofen might help with this area of pain as well.  Follow-up date 2 weeks tentatively or as needed.

## 2017-12-19 NOTE — Patient Instructions (Addendum)
You do appear to have moderate sized umbilical hernia.  You could try some low-dose ibuprofen 200-400 mg for mild to moderate pain.  However since the area has increased in size do think it would be best to get you in to see general surgeon for opinion regarding surgical treatment.  Please get CBC and metabolic panel today.  We will see if you have any elevated infection fighting cells or any other abnormalities.   Sometimes hernias can have rare but serious complications.  If you get any severe increasing pain with other symptoms as discussed then be seen in the emergency department.  I did go ahead and place referral to general surgeon.  If you have not got a call regarding appointment by next Wednesday please call our office for an update.  Regarding your right knee pain recently, I do want to get x-ray of the knee to assess the palpable abnormality in the superior portion of your patella.  If no cause found and mild pain persist might consider referral to sports medicine.  A low dose ibuprofen might help with this area of pain as well.  Follow-up date 2 weeks tentatively or as needed.  Appointment here would be dependent on if referral to surgeon was delayed.

## 2017-12-20 LAB — COMPREHENSIVE METABOLIC PANEL
ALK PHOS: 63 U/L (ref 39–117)
ALT: 31 U/L (ref 0–53)
AST: 21 U/L (ref 0–37)
Albumin: 4.4 g/dL (ref 3.5–5.2)
BUN: 21 mg/dL (ref 6–23)
CO2: 32 mEq/L (ref 19–32)
Calcium: 9.5 mg/dL (ref 8.4–10.5)
Chloride: 101 mEq/L (ref 96–112)
Creatinine, Ser: 1.06 mg/dL (ref 0.40–1.50)
GFR: 79.43 mL/min (ref >60.00)
GLUCOSE: 84 mg/dL (ref 70–99)
POTASSIUM: 4.1 meq/L (ref 3.5–5.1)
Sodium: 141 mEq/L (ref 135–145)
TOTAL PROTEIN: 7.1 g/dL (ref 6.0–8.3)
Total Bilirubin: 0.8 mg/dL (ref 0.2–1.2)

## 2017-12-20 LAB — CBC WITH DIFFERENTIAL/PLATELET
BASOS PCT: 0.6 % (ref 0.0–3.0)
Basophils Absolute: 0 10*3/uL (ref 0.0–0.1)
EOS PCT: 5 % (ref 0.0–5.0)
Eosinophils Absolute: 0.4 10*3/uL (ref 0.0–0.7)
HCT: 42.2 % (ref 39.0–52.0)
Hemoglobin: 14.4 g/dL (ref 13.0–17.0)
LYMPHS ABS: 2.4 10*3/uL (ref 0.7–4.0)
Lymphocytes Relative: 28.9 % (ref 12.0–46.0)
MCHC: 34 g/dL (ref 30.0–36.0)
MCV: 85.4 fl (ref 78.0–100.0)
MONOS PCT: 6.7 % (ref 3.0–12.0)
Monocytes Absolute: 0.6 10*3/uL (ref 0.1–1.0)
NEUTROS ABS: 4.9 10*3/uL (ref 1.4–7.7)
NEUTROS PCT: 58.8 % (ref 43.0–77.0)
PLATELETS: 255 10*3/uL (ref 150.0–400.0)
RBC: 4.94 Mil/uL (ref 4.22–5.81)
RDW: 12.9 % (ref 11.5–15.5)
WBC: 8.3 10*3/uL (ref 4.0–10.5)

## 2017-12-30 ENCOUNTER — Ambulatory Visit: Payer: Self-pay | Admitting: Surgery

## 2017-12-30 DIAGNOSIS — K429 Umbilical hernia without obstruction or gangrene: Secondary | ICD-10-CM | POA: Diagnosis not present

## 2017-12-30 NOTE — H&P (Signed)
CC: Umbilical bulge, discomfort  HPI: George Ward is a pleasant 47 year old gentleman here today from his primary care physician for evaluation of umbilical bulge/discomfort. He has had this for approximately 10 months. Prior to this he did not note an umbilical bulge. He describes his symptoms as being intermittent sharp/crampy discomfort. The pain is increased by doing crunches, sitting up, and straining/urinating. The pain does not radiate. The pain is alleviated by avoiding activities previously mentioned and make it worse. He denies any episodes of the bulge becoming stuck. He denies any issues of nausea/vomiting or change in bowel habits.  PMH: Hypertension (well controlled on oral antihypertensive), hyperlipidemia (well controlled with statin), anxiety and depression (managed well with antidepressives and anxiolytic), migraines (well controlled with Imitrex)  PSH: Denies  FHx: Denies FHx of malignancy. Maternal grandfather had prostate cancer. Paternal grandfather had CAD.  Social: Denies use of tobacco/drugs; social EtOH use. He is a middle school teacher-currently teaching [employer]  ROS: A comprehensive 10 system review of systems was completed with the patient and pertinent findings as noted above.  The patient is a 47 year old male.   Past Surgical History Sander Nephew, CMA; 12/30/2017 10:20 AM) No pertinent past surgical history   Diagnostic Studies History Sander Nephew, CMA; 12/30/2017 10:20 AM) Colonoscopy  never  Allergies Duwayne Heck Gerrigner, CMA; 12/30/2017 10:21 AM) Lisinopril *ANTIHYPERTENSIVES*  Allergies Reconciled   Medication History Sander Nephew, CMA; 12/30/2017 10:21 AM) LORazepam (0.5MG  Tablet, Oral) Active. Atorvastatin Calcium (20MG  Tablet, Oral) Active. FLUoxetine HCl (40MG  Capsule, Oral) Active. Triamterene-HCTZ (37.5-25MG  Tablet, Oral) Active. QUEtiapine Fumarate (50MG  Tablet, Oral) Active. SUMAtriptan  Succinate (50MG  Tablet, Oral) Active. Medications Reconciled  Social History Duwayne Heck [employer], CMA; 12/30/2017 10:20 AM) Alcohol use  Occasional alcohol use. Caffeine use  Coffee. Illicit drug use  Remotely quit drug use. Tobacco use  Former smoker.  Family History Sander Nephew, CMA; 12/30/2017 10:20 AM) Heart disease in male family member before age 57  Hypertension  Mother. Migraine Headache  Daughter.  Other Problems Sander Nephew, CMA; 12/30/2017 10:20 AM) Anxiety Disorder  Asthma  Back Pain  Depression  High blood pressure  Hypercholesterolemia  Migraine Headache  Umbilical Hernia Repair     Review of Systems (Danielle Gerrigner CMA; 12/30/2017 10:20 AM) General Not Present- Appetite Loss, Chills, Fatigue, Fever, Night Sweats, Weight Gain and Weight Loss. Skin Not Present- Change in Wart/Mole, Dryness, Hives, Jaundice, New Lesions, Non-Healing Wounds, Rash and Ulcer. HEENT Present- Seasonal Allergies and Wears glasses/contact lenses. Not Present- Earache, Hearing Loss, Hoarseness, Nose Bleed, Oral Ulcers, Ringing in the Ears, Sinus Pain, Sore Throat, Visual Disturbances and Yellow Eyes. Respiratory Not Present- Bloody sputum, Chronic Cough, Difficulty Breathing, Snoring and Wheezing. Breast Not Present- Breast Mass, Breast Pain, Nipple Discharge and Skin Changes. Cardiovascular Not Present- Chest Pain, Difficulty Breathing Lying Down, Leg Cramps, Palpitations, Rapid Heart Rate, Shortness of Breath and Swelling of Extremities. Gastrointestinal Present- Abdominal Pain. Not Present- Bloating, Bloody Stool, Change in Bowel Habits, Chronic diarrhea, Constipation, Difficulty Swallowing, Excessive gas, Gets full quickly at meals, Hemorrhoids, Indigestion, Nausea, Rectal Pain and Vomiting. Male Genitourinary Not Present- Blood in Urine, Change in Urinary Stream, Frequency, Impotence, Nocturia, Painful Urination, Urgency and Urine Leakage. Musculoskeletal  Not Present- Back Pain, Joint Pain, Joint Stiffness, Muscle Pain, Muscle Weakness and Swelling of Extremities. Neurological Not Present- Decreased Memory, Fainting, Headaches, Numbness, Seizures, Tingling, Tremor, Trouble walking and Weakness. Psychiatric Present- Anxiety and Depression. Not Present- Bipolar, Change in Sleep Pattern, Fearful and Frequent crying. Endocrine Not Present- Cold Intolerance, Excessive  Hunger, Hair Changes, Heat Intolerance, Hot flashes and New Diabetes. Hematology Not Present- Blood Thinners, Easy Bruising, Excessive bleeding, Gland problems, HIV and Persistent Infections.  Vitals Duwayne Heck(Danielle Gerrigner CMA; 12/30/2017 10:22 AM) 12/30/2017 10:21 AM Weight: 205 lb Height: 69in Body Surface Area: 2.09 m Body Mass Index: 30.27 kg/m  Temp.: 98.37F(Oral)  Pulse: 103 (Regular)  BP: 124/88 (Sitting, Left Arm, Standard)       Physical Exam Cristal Deer(Abdirahim Flavell M. Filbert Craze MD; 12/30/2017 10:49 AM) The physical exam findings are as follows: Note:Constitutional: No acute distress; conversant; no deformities Eyes: Moist conjunctiva; no lid lag; anicteric sclerae; pupils equal round and reactive to light Neck: Trachea midline; no palpable thyromegaly Lungs: Normal respiratory effort; no tactile fremitus CV: Regular rate and rhythm; no palpable thrill; no pitting edema GI: Abdomen soft, nontender, nondistended; no palpable hepatosplenomegaly; reducible umbilical bulge-approximately the width of the index finger. No surrounding skin changes. MSK: Normal gait; no clubbing/cyanosis Psychiatric: Appropriate affect; alert and oriented 3 Lymphatic: No palpable cervical or axillary lymphadenopathy    Assessment & Plan Cristal Deer(Charley Miske M. Kiele Heavrin MD; 12/30/2017 10:53 AM) UMBILICAL HERNIA (K42.9) Impression: George Ward is a very pleasant 46yoM with hx of HTN, HLD, anxiety and depression, migraines here today for evaluation of a moderately symptomatic, reducible primary umbilical  hernia. -The anatomy and physiology of the abdominal wall and GI tract were discussed at length with associated pictures. The pathophysiology of umbilical hernias was discussed with associated pictures. -The anatomy and physiology of the GI tract and abdominal wall was discussed at length with the patient with associated illustrations. The pathophysiology of hernias was discussed at length with associated pictures. Given his hernia is moderately symptomatic causing a fair amount of discomfort, I believe he would benefit from surgical repair. We discussed his options including operative and nonoperative. We discussed the risks of both operative and nonoperative management. The risks of nonoperative management including worsening pain, incarceration, strangulation, need for emergent surgery. -The planned procedure, material risks (including, but not limited to, pain, bleeding, infection, scarring, need for blood transfusion, damage to surrounding structures-blood vessels/nerves/viscus/organs, need for additional procedures, recurrence, chronic pain, mesh complications including erosion into other structures/vessels/organs/viscus, pneumonia, heart attack, stroke, death) benefits and alternatives to surgery were discussed at length. I noted a good probability that the procedure help improve his symptoms. The patient's questions were answered to his satisfaction, he voiced understanding and he elected to proceed with surgery. Additionally, we discussed typical postoperative expectations and the recovery process.  Signed electronically by Andria Meusehristopher M Markos Theil, MD (12/30/2017 10:54 AM)

## 2018-01-03 ENCOUNTER — Encounter (HOSPITAL_COMMUNITY): Payer: Self-pay | Admitting: Psychiatry

## 2018-01-03 ENCOUNTER — Encounter: Payer: Self-pay | Admitting: Medical

## 2018-01-03 NOTE — Telephone Encounter (Signed)
fyi

## 2018-01-18 ENCOUNTER — Encounter: Payer: Self-pay | Admitting: Medical

## 2018-01-20 MED ORDER — ATORVASTATIN CALCIUM 20 MG PO TABS
20.0000 mg | ORAL_TABLET | Freq: Every day | ORAL | 0 refills | Status: DC
Start: 2018-01-20 — End: 2018-04-14

## 2018-01-24 ENCOUNTER — Encounter (HOSPITAL_COMMUNITY): Payer: Self-pay | Admitting: Psychiatry

## 2018-01-27 ENCOUNTER — Ambulatory Visit (HOSPITAL_COMMUNITY): Payer: Self-pay | Admitting: Psychiatry

## 2018-01-27 ENCOUNTER — Other Ambulatory Visit: Payer: Self-pay | Admitting: Medical

## 2018-01-28 ENCOUNTER — Other Ambulatory Visit (HOSPITAL_COMMUNITY): Payer: Self-pay | Admitting: Psychiatry

## 2018-01-28 DIAGNOSIS — F3341 Major depressive disorder, recurrent, in partial remission: Secondary | ICD-10-CM

## 2018-01-28 MED ORDER — LORAZEPAM 0.5 MG PO TABS: 0.5000 mg | ORAL_TABLET | Freq: Two times a day (BID) | ORAL | 1 refills | Status: DC | PRN

## 2018-03-17 HISTORY — PX: EXCISION MASS HEAD: SHX6702

## 2018-03-26 ENCOUNTER — Encounter: Payer: Self-pay | Admitting: Medical

## 2018-03-27 DIAGNOSIS — L905 Scar conditions and fibrosis of skin: Secondary | ICD-10-CM | POA: Diagnosis not present

## 2018-03-27 DIAGNOSIS — R238 Other skin changes: Secondary | ICD-10-CM | POA: Diagnosis not present

## 2018-03-27 DIAGNOSIS — D485 Neoplasm of uncertain behavior of skin: Secondary | ICD-10-CM | POA: Diagnosis not present

## 2018-03-27 NOTE — Patient Instructions (Addendum)
George Ward  03/27/2018   Your procedure is scheduled on: 04-02-18   Report to [facility] Main  Entrance    Report to admitting at 6:30AM    Call this number if you have problems the morning of surgery [phone]     Remember: Do not eat food or drink liquids :After Midnight.     Take these medicines the morning of surgery with A SIP OF WATER: tylenol if needed, albuterol inhaler if needed (please bring), fluoxetine, loratadine if  needed, lorazepam if needed, sumatriptan if needed                                You may not have any metal on your body including hair pins and              piercings  Do not wear jewelry, make-up, lotions, powders or perfumes, deodorant                    Men may shave face and neck.   Do not bring valuables to the hospital. [facility] IS NOT             RESPONSIBLE   FOR VALUABLES.  Contacts, dentures or bridgework may not be worn into surgery.       Patients discharged the day of surgery will not be allowed to drive home.  Name and phone number of your driver:  Special Instructions: N/A              Please read over the following fact sheets you were given: _____________________________________________________________________             [facility] - Preparing for Surgery Before surgery, you can play an important role.  Because skin is not sterile, your skin needs to be as free of germs as possible.  You can reduce the number of germs on your skin by washing with CHG (chlorahexidine gluconate) soap before surgery.  CHG is an antiseptic cleaner which kills germs and bonds with the skin to continue killing germs even after washing. Please DO NOT use if you have an allergy to CHG or antibacterial soaps.  If your skin becomes reddened/irritated stop using the CHG and inform your nurse when you arrive at Short Stay. Do not shave (including legs and underarms) for at least 48 hours prior to the first CHG shower.   You may shave your face/neck. Please follow these instructions carefully:    1.  Shower with CHG Soap the night before surgery and the  morning of Surgery.  2.  If you choose to wash your hair, wash your hair first as usual with your  normal  shampoo.  3.  After you shampoo, rinse your hair and body thoroughly to remove the  shampoo.                           4.  Use CHG as you would any other liquid soap.  You can apply chg directly  to the skin and wash                       Gently with a scrungie or clean washcloth.  5.  Apply the CHG Soap to your body ONLY FROM THE NECK DOWN.  Do not use on face/ open                           Wound or open sores. Avoid contact with eyes, ears mouth and genitals (private parts).                       Wash face,  Genitals (private parts) with your normal soap.             6.  Wash thoroughly, paying special attention to the area where your surgery  will be performed.  7.  Thoroughly rinse your body with warm water from the neck down.  8.  DO NOT shower/wash with your normal soap after using and rinsing off  the CHG Soap.                9.  Pat yourself dry with a clean towel.            10.  Wear clean pajamas.            11.  Place clean sheets on your bed the night of your first shower and do not  sleep with pets. Day of Surgery : Do not apply any lotions/deodorants the morning of surgery.  Please wear clean clothes to the hospital/surgery center.  FAILURE TO FOLLOW THESE INSTRUCTIONS MAY RESULT IN THE CANCELLATION OF YOUR SURGERY PATIENT SIGNATURE_________________________________  NURSE SIGNATURE__________________________________  ________________________________________________________________________

## 2018-03-27 NOTE — Progress Notes (Signed)
ekg 07-01-17 epic

## 2018-03-28 ENCOUNTER — Encounter (HOSPITAL_COMMUNITY): Payer: Self-pay

## 2018-03-28 ENCOUNTER — Encounter (HOSPITAL_COMMUNITY)
Admission: RE | Admit: 2018-03-28 | Discharge: 2018-03-28 | Disposition: A | Discharge status: Home / Self Care | Payer: BLUE CROSS/BLUE SHIELD | Source: Ambulatory Visit | Attending: Surgery | Admitting: Surgery

## 2018-03-28 ENCOUNTER — Other Ambulatory Visit: Payer: Self-pay

## 2018-03-28 DIAGNOSIS — K429 Umbilical hernia without obstruction or gangrene: Secondary | ICD-10-CM | POA: Diagnosis not present

## 2018-03-28 DIAGNOSIS — Z01812 Encounter for preprocedural laboratory examination: Secondary | ICD-10-CM | POA: Insufficient documentation

## 2018-03-28 HISTORY — DX: Low back pain: M54.5

## 2018-03-28 HISTORY — DX: Low back pain, unspecified: M54.50

## 2018-03-28 LAB — COMPREHENSIVE METABOLIC PANEL
ALK PHOS: 70 U/L (ref 38–126)
ALT: 37 U/L (ref 0–44)
ANION GAP: 9 (ref 5–15)
AST: 25 U/L (ref 15–41)
Albumin: 4.2 g/dL (ref 3.5–5.0)
BILIRUBIN TOTAL: 0.9 mg/dL (ref 0.3–1.2)
BUN: 18 mg/dL (ref 6–20)
CO2: 29 mmol/L (ref 22–32)
CREATININE: 1.07 mg/dL (ref 0.61–1.24)
Calcium: 9.5 mg/dL (ref 8.9–10.3)
Chloride: 100 mmol/L (ref 98–111)
Glucose, Bld: 96 mg/dL (ref 70–99)
Potassium: 3.8 mmol/L (ref 3.5–5.1)
SODIUM: 138 mmol/L (ref 135–145)
TOTAL PROTEIN: 7.3 g/dL (ref 6.5–8.1)

## 2018-03-28 LAB — CBC WITH DIFFERENTIAL/PLATELET
Basophils Absolute: 0 10*3/uL (ref 0.0–0.1)
Basophils Relative: 1 %
Eosinophils Absolute: 0.3 10*3/uL (ref 0.0–0.7)
Eosinophils Relative: 5 %
HCT: 45.9 % (ref 39.0–52.0)
HEMOGLOBIN: 15.9 g/dL (ref 13.0–17.0)
LYMPHS ABS: 2 10*3/uL (ref 0.7–4.0)
LYMPHS PCT: 31 %
MCH: 29.7 pg (ref 26.0–34.0)
MCHC: 34.6 g/dL (ref 30.0–36.0)
MCV: 85.6 fL (ref 78.0–100.0)
MONOS PCT: 7 %
Monocytes Absolute: 0.4 10*3/uL (ref 0.1–1.0)
NEUTROS PCT: 56 %
Neutro Abs: 3.8 10*3/uL (ref 1.7–7.7)
Platelets: 251 10*3/uL (ref 150–400)
RBC: 5.36 MIL/uL (ref 4.22–5.81)
RDW: 12.6 % (ref 11.5–15.5)
WBC: 6.6 10*3/uL (ref 4.0–10.5)

## 2018-03-28 NOTE — Progress Notes (Signed)
Message from nurse at [facility] that patient was confused about instructions received from pre-op appt today. RN called and spoke to patient. Patient explained that his wife wanted to know if there were any antibiotics that he needed to be on before surgery and where he could get info about the pain medicine he would get after surgery. RN explained to patient that pre-op dept does not prescribe or give out prescription for any medications and that he may contact his surgeons office or pharmacy to see if they have a prescription for him. RN also explained that any narcotic pain medication prescription would likely be provided to him as a paper prescription upon discharge after surgery. Patient verbalized understanding and RN confirmed with patient that he had the instruction packet given to him by this RN at pre-op appt. Patient confirmed this.

## 2018-04-02 ENCOUNTER — Encounter (HOSPITAL_COMMUNITY)
Admission: RE | Disposition: A | Discharge status: Home / Self Care | Payer: Self-pay | Source: Ambulatory Visit | Attending: Surgery

## 2018-04-02 ENCOUNTER — Ambulatory Visit (HOSPITAL_COMMUNITY): Payer: BLUE CROSS/BLUE SHIELD | Admitting: Registered Nurse

## 2018-04-02 ENCOUNTER — Encounter (HOSPITAL_COMMUNITY): Payer: Self-pay | Admitting: Emergency Medicine

## 2018-04-02 ENCOUNTER — Ambulatory Visit (HOSPITAL_COMMUNITY)
Admission: RE | Admit: 2018-04-02 | Discharge: 2018-04-02 | Disposition: A | Discharge status: Home / Self Care | Payer: BLUE CROSS/BLUE SHIELD | Source: Ambulatory Visit | Attending: Surgery | Admitting: Surgery

## 2018-04-02 DIAGNOSIS — E669 Obesity, unspecified: Secondary | ICD-10-CM | POA: Diagnosis not present

## 2018-04-02 DIAGNOSIS — F418 Other specified anxiety disorders: Secondary | ICD-10-CM | POA: Diagnosis not present

## 2018-04-02 DIAGNOSIS — K429 Umbilical hernia without obstruction or gangrene: Secondary | ICD-10-CM | POA: Insufficient documentation

## 2018-04-02 DIAGNOSIS — I10 Essential (primary) hypertension: Secondary | ICD-10-CM | POA: Diagnosis not present

## 2018-04-02 DIAGNOSIS — E785 Hyperlipidemia, unspecified: Secondary | ICD-10-CM | POA: Insufficient documentation

## 2018-04-02 DIAGNOSIS — Z791 Long term (current) use of non-steroidal anti-inflammatories (NSAID): Secondary | ICD-10-CM | POA: Diagnosis not present

## 2018-04-02 DIAGNOSIS — Z87891 Personal history of nicotine dependence: Secondary | ICD-10-CM | POA: Diagnosis not present

## 2018-04-02 DIAGNOSIS — J45909 Unspecified asthma, uncomplicated: Secondary | ICD-10-CM | POA: Diagnosis not present

## 2018-04-02 DIAGNOSIS — G43909 Migraine, unspecified, not intractable, without status migrainosus: Secondary | ICD-10-CM | POA: Diagnosis not present

## 2018-04-02 DIAGNOSIS — Z79899 Other long term (current) drug therapy: Secondary | ICD-10-CM | POA: Diagnosis not present

## 2018-04-02 DIAGNOSIS — Z6830 Body mass index (BMI) 30.0-30.9, adult: Secondary | ICD-10-CM | POA: Insufficient documentation

## 2018-04-02 HISTORY — PX: UMBILICAL HERNIA REPAIR: SHX196

## 2018-04-02 SURGERY — REPAIR, HERNIA, UMBILICAL, ADULT
Anesthesia: General

## 2018-04-02 MED ORDER — KETAMINE HCL 10 MG/ML IJ SOLN
INTRAMUSCULAR | Status: DC | PRN
  Administered 2018-04-02 (×2): 25 mg via INTRAVENOUS

## 2018-04-02 MED ORDER — BUPIVACAINE LIPOSOME 1.3 % IJ SUSP
20.0000 mL | Freq: Once | INTRAMUSCULAR | Status: DC
  Filled 2018-04-02: qty 20

## 2018-04-02 MED ORDER — LIDOCAINE 2% (20 MG/ML) 5 ML SYRINGE
INTRAMUSCULAR | Status: DC | PRN
  Administered 2018-04-02: 1 mg/kg/h via INTRAVENOUS

## 2018-04-02 MED ORDER — MIDAZOLAM HCL 2 MG/2ML IJ SOLN
INTRAMUSCULAR | Status: AC
  Filled 2018-04-02: qty 2

## 2018-04-02 MED ORDER — ACETAMINOPHEN 500 MG PO TABS
1000.0000 mg | ORAL_TABLET | ORAL | Status: AC
  Administered 2018-04-02: 1000 mg via ORAL
  Filled 2018-04-02: qty 2

## 2018-04-02 MED ORDER — CHLORHEXIDINE GLUCONATE CLOTH 2 % EX PADS: 6.0000 | MEDICATED_PAD | Freq: Once | CUTANEOUS | Status: DC

## 2018-04-02 MED ORDER — FENTANYL CITRATE (PF) 100 MCG/2ML IJ SOLN
INTRAMUSCULAR | Status: DC | PRN
  Administered 2018-04-02 (×2): 50 ug via INTRAVENOUS

## 2018-04-02 MED ORDER — FENTANYL CITRATE (PF) 100 MCG/2ML IJ SOLN: 25.0000 ug | INTRAMUSCULAR | Status: DC | PRN

## 2018-04-02 MED ORDER — GABAPENTIN 300 MG PO CAPS
300.0000 mg | ORAL_CAPSULE | ORAL | Status: AC
  Administered 2018-04-02: 300 mg via ORAL
  Filled 2018-04-02: qty 1

## 2018-04-02 MED ORDER — TRAMADOL HCL 50 MG PO TABS
50.0000 mg | ORAL_TABLET | Freq: Four times a day (QID) | ORAL | Status: DC | PRN
  Administered 2018-04-02: 50 mg via ORAL

## 2018-04-02 MED ORDER — EPHEDRINE SULFATE-NACL 50-0.9 MG/10ML-% IV SOSY
PREFILLED_SYRINGE | INTRAVENOUS | Status: DC | PRN
  Administered 2018-04-02: 5 mg via INTRAVENOUS

## 2018-04-02 MED ORDER — 0.9 % SODIUM CHLORIDE (POUR BTL) OPTIME
TOPICAL | Status: DC | PRN
  Administered 2018-04-02: 1000 mL

## 2018-04-02 MED ORDER — SUGAMMADEX SODIUM 200 MG/2ML IV SOLN
INTRAVENOUS | Status: DC | PRN
  Administered 2018-04-02: 185 mg via INTRAVENOUS

## 2018-04-02 MED ORDER — TRAMADOL HCL 50 MG PO TABS
ORAL_TABLET | ORAL | Status: AC
  Filled 2018-04-02: qty 1

## 2018-04-02 MED ORDER — BUPIVACAINE-EPINEPHRINE 0.25% -1:200000 IJ SOLN
INTRAMUSCULAR | Status: DC | PRN
  Administered 2018-04-02: 30 mL

## 2018-04-02 MED ORDER — TRAMADOL HCL 50 MG PO TABS: 50.0000 mg | ORAL_TABLET | Freq: Four times a day (QID) | ORAL | 0 refills | Status: AC | PRN

## 2018-04-02 MED ORDER — DEXAMETHASONE SODIUM PHOSPHATE 10 MG/ML IJ SOLN
INTRAMUSCULAR | Status: DC | PRN
  Administered 2018-04-02: 10 mg via INTRAVENOUS

## 2018-04-02 MED ORDER — DEXAMETHASONE SODIUM PHOSPHATE 10 MG/ML IJ SOLN
INTRAMUSCULAR | Status: AC
  Filled 2018-04-02: qty 1

## 2018-04-02 MED ORDER — LACTATED RINGERS IV SOLN
INTRAVENOUS | Status: DC
Start: 2018-04-02 — End: 2018-04-02
  Administered 2018-04-02 (×2): via INTRAVENOUS

## 2018-04-02 MED ORDER — TAMSULOSIN HCL 0.4 MG PO CAPS
0.4000 mg | ORAL_CAPSULE | Freq: Every day | ORAL | Status: AC
  Administered 2018-04-02: 0.4 mg via ORAL
  Filled 2018-04-02: qty 1

## 2018-04-02 MED ORDER — LIDOCAINE 2% (20 MG/ML) 5 ML SYRINGE
INTRAMUSCULAR | Status: AC
  Filled 2018-04-02: qty 5

## 2018-04-02 MED ORDER — CEFAZOLIN SODIUM-DEXTROSE 2-4 GM/100ML-% IV SOLN
2.0000 g | INTRAVENOUS | Status: AC
  Administered 2018-04-02: 2 g via INTRAVENOUS
  Filled 2018-04-02: qty 100

## 2018-04-02 MED ORDER — ONDANSETRON HCL 4 MG/2ML IJ SOLN
INTRAMUSCULAR | Status: DC | PRN
  Administered 2018-04-02: 4 mg via INTRAVENOUS

## 2018-04-02 MED ORDER — LIDOCAINE 2% (20 MG/ML) 5 ML SYRINGE
INTRAMUSCULAR | Status: DC | PRN
  Administered 2018-04-02: 80 mg via INTRAVENOUS

## 2018-04-02 MED ORDER — LIDOCAINE 2% (20 MG/ML) 5 ML SYRINGE
INTRAMUSCULAR | Status: AC
  Filled 2018-04-02: qty 10

## 2018-04-02 MED ORDER — MIDAZOLAM HCL 5 MG/5ML IJ SOLN
INTRAMUSCULAR | Status: DC | PRN
  Administered 2018-04-02: 2 mg via INTRAVENOUS

## 2018-04-02 MED ORDER — HEPARIN SODIUM (PORCINE) 5000 UNIT/ML IJ SOLN
5000.0000 [IU] | Freq: Once | INTRAMUSCULAR | Status: AC
  Administered 2018-04-02: 5000 [IU] via SUBCUTANEOUS
  Filled 2018-04-02: qty 1

## 2018-04-02 MED ORDER — PROPOFOL 10 MG/ML IV BOLUS
INTRAVENOUS | Status: AC
  Filled 2018-04-02: qty 20

## 2018-04-02 MED ORDER — DIPHENHYDRAMINE HCL 50 MG/ML IJ SOLN
INTRAMUSCULAR | Status: DC | PRN
  Administered 2018-04-02: 12.5 mg via INTRAVENOUS

## 2018-04-02 MED ORDER — PROPOFOL 10 MG/ML IV BOLUS
INTRAVENOUS | Status: DC | PRN
  Administered 2018-04-02: 180 mg via INTRAVENOUS

## 2018-04-02 MED ORDER — FENTANYL CITRATE (PF) 100 MCG/2ML IJ SOLN
INTRAMUSCULAR | Status: AC
  Filled 2018-04-02: qty 2

## 2018-04-02 MED ORDER — ROCURONIUM BROMIDE 10 MG/ML (PF) SYRINGE
PREFILLED_SYRINGE | INTRAVENOUS | Status: DC | PRN
  Administered 2018-04-02: 50 mg via INTRAVENOUS

## 2018-04-02 MED ORDER — KETAMINE HCL 10 MG/ML IJ SOLN
INTRAMUSCULAR | Status: AC
  Filled 2018-04-02: qty 1

## 2018-04-02 MED ORDER — PROMETHAZINE HCL 25 MG/ML IJ SOLN: 6.2500 mg | INTRAMUSCULAR | Status: DC | PRN

## 2018-04-02 MED ORDER — EPHEDRINE 5 MG/ML INJ
INTRAVENOUS | Status: AC
  Filled 2018-04-02: qty 10

## 2018-04-02 MED ORDER — BUPIVACAINE-EPINEPHRINE (PF) 0.25% -1:200000 IJ SOLN
INTRAMUSCULAR | Status: AC
  Filled 2018-04-02: qty 30

## 2018-04-02 MED ORDER — DIPHENHYDRAMINE HCL 50 MG/ML IJ SOLN
INTRAMUSCULAR | Status: AC
  Filled 2018-04-02: qty 1

## 2018-04-02 MED ORDER — ONDANSETRON HCL 4 MG/2ML IJ SOLN
INTRAMUSCULAR | Status: AC
  Filled 2018-04-02: qty 2

## 2018-04-02 SURGICAL SUPPLY — 26 items
CHLORAPREP W/TINT 26ML (MISCELLANEOUS) ×3 IMPLANT
COVER SURGICAL LIGHT HANDLE (MISCELLANEOUS) ×3 IMPLANT
DECANTER SPIKE VIAL GLASS SM (MISCELLANEOUS) ×3 IMPLANT
DERMABOND ADVANCED (GAUZE/BANDAGES/DRESSINGS) ×2
DERMABOND ADVANCED .7 DNX12 (GAUZE/BANDAGES/DRESSINGS) ×1 IMPLANT
DRAPE LAPAROSCOPIC ABDOMINAL (DRAPES) ×3 IMPLANT
DRSG TEGADERM 4X4.75 (GAUZE/BANDAGES/DRESSINGS) ×3 IMPLANT
GAUZE SPONGE 4X4 12PLY STRL (GAUZE/BANDAGES/DRESSINGS) ×3 IMPLANT
GLOVE BIO SURGEON STRL SZ7 (GLOVE) ×3 IMPLANT
GLOVE BIO SURGEON STRL SZ7.5 (GLOVE) ×3 IMPLANT
GLOVE BIOGEL PI IND STRL 7.0 (GLOVE) ×1 IMPLANT
GLOVE BIOGEL PI INDICATOR 7.0 (GLOVE) ×2
GLOVE INDICATOR 8.0 STRL GRN (GLOVE) ×3 IMPLANT
GOWN STRL REUS W/TWL XL LVL3 (GOWN DISPOSABLE) ×6 IMPLANT
KIT BASIN OR (CUSTOM PROCEDURE TRAY) ×3 IMPLANT
MESH VENTRALEX ST 1-7/10 CRC S (Mesh General) ×3 IMPLANT
NEEDLE HYPO 22GX1.5 SAFETY (NEEDLE) ×3 IMPLANT
PACK GENERAL/GYN (CUSTOM PROCEDURE TRAY) ×3 IMPLANT
SPONGE LAP 18X18 RF (DISPOSABLE) IMPLANT
SUT ETHIBOND 0 MO6 C/R (SUTURE) ×6 IMPLANT
SUT MNCRL AB 4-0 PS2 18 (SUTURE) ×3 IMPLANT
SUT VIC AB 3-0 SH 27 (SUTURE) ×2
SUT VIC AB 3-0 SH 27X BRD (SUTURE) ×1 IMPLANT
SYR 20CC LL (SYRINGE) ×3 IMPLANT
TOWEL OR 17X26 10 PK STRL BLUE (TOWEL DISPOSABLE) ×3 IMPLANT
TOWEL OR NON WOVEN STRL DISP B (DISPOSABLE) IMPLANT

## 2018-04-02 NOTE — H&P (Addendum)
CC: Umbilical bulge - here today for surgery  HPI: [name] is a pleasant 47 year old gentleman referred to me from his primary care physician for evaluation of umbilical bulge/discomfort. He has had this for approximately 12 months. Prior to this he did not note an umbilical bulge. He describes his symptoms as being intermittent sharp/crampy discomfort. The pain is increased by doing crunches, sitting up, and straining/urinating. The pain does not radiate. The pain is alleviated by avoiding activities previously mentioned and make it worse. He denies any episodes of the bulge becoming stuck. He denies any issues of nausea/vomiting or change in bowel habits.  Past Medical History:  Diagnosis Date  . Alcohol abuse    Remission >20 years ago  . Allergy   . Anxiety   . Asthma   . Depression    MAJOR DEPRESSIVE DISORDER  . Depression with anxiety   . Drug abuse (HCC)    Remission >20 years ago  . Encounter for HIV (human immunodeficiency virus) test 2014-2015  . Environmental allergies   . History of chicken pox   . Hyperlipidemia   . Hypertension   . Hypoalphalipoproteinemia, familial   . Lumbar pain   . Migraines   . Primary generalized (osteo)arthritis   . Seasonal allergies     Past Surgical History:  Procedure Laterality Date  . EXCISION MASS HEAD  03/17/2018   excision cells on head  ; still in testing for cancer per patient report   . LUMBAR EPIDURAL INJECTION    . WISDOM TOOTH EXTRACTION  1989-1990    Family History  Problem Relation Age of Onset  . Hypertension Mother   . Hyperlipidemia Father   . Healthy Sister   . Colon cancer Maternal Grandfather   . Prostate cancer Maternal Grandfather   . Congestive Heart Failure Maternal Grandfather   . Dementia Paternal Grandmother   . Heart disease Paternal Grandfather   . Heart attack Paternal Grandfather   . Hypertension Maternal Uncle   . Hyperlipidemia Maternal Uncle   . Hepatitis Paternal Aunt   .  Hepatitis C Paternal Aunt   . Hypertension Paternal Uncle   . Hyperlipidemia Paternal Uncle   . Healthy Son        x1  . Healthy Daughter        x4    Social:  reports that he quit smoking about 30 years ago. His smoking use included cigarettes. He has quit using smokeless tobacco. He reports that he drank alcohol. He reports that he has current or past drug history.  Allergies:  Allergies  Allergen Reactions  . Co-Trimoxazole [Sulfamethoxazole-Trimethoprim] Nausea And Vomiting  . Lisinopril Cough    With Wheezing   . Sulfa Antibiotics Nausea And Vomiting    Medications: I have reviewed the patient's current medications.  No results found for this or any previous visit (from the past 48 hour(s)).  No results found.  ROS - all of the below systems have been reviewed with the patient and positives are indicated with bold text General: chills, fever or night sweats Eyes: blurry vision or double vision ENT: epistaxis or sore throat Allergy/Immunology: itchy/watery eyes or nasal congestion Hematologic/Lymphatic: bleeding problems, blood clots or swollen lymph nodes Endocrine: temperature intolerance or unexpected weight changes Breast: new or changing breast lumps or nipple discharge Resp: cough, shortness of breath, or wheezing CV: chest pain or dyspnea on exertion GI: as per HPI GU: dysuria, trouble voiding, or hematuria MSK: joint pain or joint stiffness Neuro: TIA or  stroke symptoms Derm: pruritus and skin lesion changes Psych: anxiety and depression  PE Blood pressure (!) 130/98, pulse 98, temperature 97.8 F (36.6 C), temperature source Oral, resp. rate 18, height 5\' 9"  (1.753 m), weight 92.5 kg (204 lb), SpO2 96 %. Constitutional: NAD; conversant; no deformities Eyes: Moist conjunctiva; no lid lag; anicteric; PERRL Neck: Trachea midline; no thyromegaly Lungs: Normal respiratory effort; no tactile fremitus CV: RRR; no palpable thrills; no pitting edema GI: Abd  soft, NT/ND; no palpable hepatosplenomegaly; umbilical bulge - hernia - defect externally approximately the size of a finger tip MSK: Normal gait; no clubbing/cyanosis Psychiatric: Appropriate affect; alert and oriented x3 Lymphatic: No palpable cervical or axillary lymphadenopathy   A/P: George Ward is a very pleasant 46yoM with hx of HTN, HLD, anxiety and depression, migraines here today for evaluation of a moderately symptomatic, reducible primary umbilical hernia.  -The anatomy and physiology of the abdominal wall and GI tract were discussed at length with associated pictures. The pathophysiology of umbilical hernias was discussed with associated pictures. -The anatomy and physiology of the GI tract and abdominal wall was discussed at length with the patient again today. The pathophysiology of hernias was discussed at length with associated pictures. Given his hernia is moderately symptomatic causing a fair amount of discomfort, I believe he would benefit from surgical repair. We discussed his options including operative and nonoperative. We discussed the risks of both operative and nonoperative management. The risks of nonoperative management including worsening pain, incarceration, strangulation, need for emergent surgery. -The planned procedure, material risks (including, but not limited to, pain, bleeding, infection, scarring, need for blood transfusion, damage to surrounding structures-blood vessels/nerves/viscus/organs, need for additional procedures, recurrence, hematoma, seroma, chronic pain, mesh complications including erosion into other structures/vessels/organs/viscus, pneumonia, heart attack, stroke, death) benefits and alternatives to surgery were discussed at length. I noted a good probability that the procedure help improve his symptoms. The patient's questions were answered to his satisfaction, he voiced understanding and he elected to proceed with surgery. Additionally, we discussed  typical postoperative expectations and the recovery process.  Stephanie Coup. Cliffton Asters, M.D. [facility]

## 2018-04-02 NOTE — Transfer of Care (Signed)
Immediate Anesthesia Transfer of Care Note  Patient: George Ward  Procedure(s) Performed: OPEN UMBILICAL HERNIA REPAIR WITH INSERTION OF MESH (N/A )  Patient Location: PACU  Anesthesia Type:General  Level of Consciousness: awake, alert , oriented and patient cooperative  Airway & Oxygen Therapy: Patient Spontanous Breathing and Patient connected to face mask oxygen  Post-op Assessment: Report given to RN, Post -op Vital signs reviewed and stable and Patient moving all extremities  Post vital signs: Reviewed and stable  Last Vitals:  Vitals Value Taken Time  BP 143/94 04/02/2018 10:01 AM  Temp    Pulse 106 04/02/2018 10:03 AM  Resp 21 04/02/2018 10:03 AM  SpO2 96 % 04/02/2018 10:03 AM  Vitals shown include unvalidated device data.  Last Pain:  Vitals:   04/02/18 0659  TempSrc:   PainSc: 0-No pain      Patients Stated Pain Goal: 4 (04/02/18 0659)  Complications: No apparent anesthesia complications

## 2018-04-02 NOTE — Anesthesia Procedure Notes (Signed)
Procedure Name: Intubation Date/Time: 04/02/2018 9:42 AM Performed by: Victoriano Lain, CRNA Pre-anesthesia Checklist: Patient identified, Emergency Drugs available, Suction available, Patient being monitored and Timeout performed Patient Re-evaluated:Patient Re-evaluated prior to induction Oxygen Delivery Method: Circle system utilized Preoxygenation: Pre-oxygenation with 100% oxygen Induction Type: IV induction Ventilation: Mask ventilation without difficulty and Oral airway inserted - appropriate to patient size Laryngoscope Size: Mac and 4 Grade View: Grade I Tube type: Oral Tube size: 7.5 mm Number of attempts: 1 Airway Equipment and Method: Stylet Placement Confirmation: ETT inserted through vocal cords under direct vision,  positive ETCO2 and breath sounds checked- equal and bilateral Secured at: 22 cm Tube secured with: Tape Dental Injury: Teeth and Oropharynx as per pre-operative assessment

## 2018-04-02 NOTE — Op Note (Signed)
04/02/2018  9:55 AM  PATIENT:  George Ward  47 y.o. male  Patient Care Team: Saguier, Kateri McEdward, PA-C as PCP - General (Internal Medicine)  PRE-OPERATIVE DIAGNOSIS:  UMBILICAL HERNIA  POST-OPERATIVE DIAGNOSIS:  UMBILICAL HERNIA  PROCEDURE:  Open umbilical hernia repair with placement of mesh  SURGEON:  Stephanie Couphristopher M. Joeanna Howdyshell, MD  ASSISTANT: Scrub nurse  ANESTHESIA:   general  COUNTS:  Sponge, needle and instrument counts were reported correct x2 at the conclusion of the operation.  EBL: 5cc  DRAINS: None  SPECIMEN: None  COMPLICATIONS: None  FINDINGS: True primary umbilical hernia with herniation through umbilical stalk of prepreitoneal fat. Defect was wide enough to loosely accomodate a finger tip (~2cm in diameter). A 4.3cm piece of Ventralex ST circlular mesh with strap was used and placed in the preperitoneal space. The peritoneal cavity was not entered. No larger size of mesh could be used due to size of preperitoneal space. Fascial defect then closed on top of the mesh.  DISPOSITION: PACU in satisfactory condition  INDICATION: [name] is a pleasant 47 year old gentleman referred to me from his primary care physician for evaluation of umbilical bulge/discomfort. He has had this for approximately 12 months. Prior to this he did not note an umbilical bulge. He describes his symptoms as being intermittent sharp/crampy discomfort. The pain is increased by doing crunches, sitting up, and straining/urinating. On exam, he had a reducible umbilical hernia. Options were discussed. Given that this is symptomatic, I offered repair. Please refer to H&P for details regarding this discussion.  DESCRIPTION: The patient was identified in preop holding and taken to the OR where they were placed on the operating room table and SCDs were placed. General endotracheal anesthesia was induced without difficulty. Hair on the abdomen was clipped by the nursing team. Pressure points were  padded and verified. The patient was then prepped and draped in the usual sterile fashion. A surgical timeout was performed indicating the correct patient, procedure, positioning and need for preoperative antibiotics.   Local anesthetic was infiltrated in the infraumbilical region. A periumbilical infraumbilical skin incision was made with a 15 blade - oriented horizontally so as not to impair blood flow to the umbilical skin. This was carried down through the subcutaneous tissue. The umbilical hernia sac was identified and circumferentially dissected down to the level of fascia. It was freed from its attachments to the fascia. The contents were reduced which were just fat. The sac was entered at its apex as it was inside the umbilical stalk. This was done sharply. The umbilical skin was then freed from its attachments to the sac/stalk. The sac/stalk externally was excised above the fascia. The fascia was cleared from overlying subcutaneous tissue. The defect was palpated and the peritoneum was intact beneath. The defect was 2cm in diameter. Given its size, the decision was made to repair with mesh. A 4.3cm piece of Ventralex ST circlular mesh with strap was selected. 0 Ethibond sutures were then used to anchor the mesh at 12, 3, 6, and 9 o'clock positions. The mesh was then parachuted into the preperitoneal space and deployed. The edges of the mesh were flat and not rolled up. The sutures were then tied. The fascial defect was then closed using interrupted 0 Ethibond sutures in a horizontal fashion as this provided a tension free closure of the fascia. The tail of the mesh was incorporated in the closure intentionally. The tail was the trimmed such that it was just beneath the fascial closure. The wound  was then irrigated and hemostasis verified. The closure was palpated and noted to be complete. The umbilical skin was pink and well perfused. This was then tacked down to a fascial closure suture using a 3-0  vicryl suture to re-create the umbilicus. The skin was closed with a running 4-0 monocryl subcuticular suture. Dermabond was applied to the skin. 4x4 and tegaderm were then placed to cover the wound. The patient was then awakened from general anesthesia, extubated and transferred to recovering in satisfactory condition.

## 2018-04-02 NOTE — Anesthesia Preprocedure Evaluation (Addendum)
Anesthesia Evaluation  Patient identified by MRN, date of birth, ID band Patient awake    Reviewed: Allergy & Precautions, NPO status , Patient's Chart, lab work & pertinent test results  Airway Mallampati: II  TM Distance: >3 FB Neck ROM: Full    Dental  (+) Teeth Intact, Dental Advisory Given   Pulmonary asthma , former smoker,    Pulmonary exam normal breath sounds clear to auscultation       Cardiovascular hypertension, Pt. on medications (-) angina(-) CAD and (-) Past MI Normal cardiovascular exam Rhythm:Regular Rate:Normal     Neuro/Psych  Headaches, PSYCHIATRIC DISORDERS Anxiety Depression    GI/Hepatic (+)     substance abuse (Remote )  , UMBILICAL HERNIA   Endo/Other  Obesity   Renal/GU negative Renal ROS     Musculoskeletal  (+) Arthritis ,   Abdominal   Peds  Hematology negative hematology ROS (+)   Anesthesia Other Findings Day of surgery medications reviewed with the patient.  Reproductive/Obstetrics                         Anesthesia Physical Anesthesia Plan  ASA: II  Anesthesia Plan: General   Post-op Pain Management:    Induction: Intravenous  PONV Risk Score and Plan: 3 and Midazolam, Dexamethasone, Ondansetron and Diphenhydramine  Airway Management Planned: Oral ETT  Additional Equipment:   Intra-op Plan:   Post-operative Plan: Extubation in OR  Informed Consent: I have reviewed the patients History and Physical, chart, labs and discussed the procedure including the risks, benefits and alternatives for the proposed anesthesia with the patient or authorized representative who has indicated his/her understanding and acceptance.   Dental advisory given  Plan Discussed with: CRNA  Anesthesia Plan Comments:         Anesthesia Quick Evaluation

## 2018-04-02 NOTE — Anesthesia Postprocedure Evaluation (Signed)
Anesthesia Post Note  Patient: George BlonderMark D Ward  Procedure(s) Performed: OPEN UMBILICAL HERNIA REPAIR WITH INSERTION OF MESH (N/A )     Patient location during evaluation: PACU Anesthesia Type: General Level of consciousness: awake and alert Pain management: pain level controlled Vital Signs Assessment: post-procedure vital signs reviewed and stable Respiratory status: spontaneous breathing, nonlabored ventilation and respiratory function stable Cardiovascular status: blood pressure returned to baseline and stable Postop Assessment: no apparent nausea or vomiting Anesthetic complications: no    Last Vitals:  Vitals:   04/02/18 1047 04/02/18 1445  BP: (!) 141/92 132/89  Pulse: (!) 108 (!) 105  Resp: 16 20  Temp: 36.9 C 36.9 C  SpO2: 93% 95%    Last Pain:  Vitals:   04/02/18 1445  TempSrc:   PainSc: 2                  Cecile HearingStephen Edward Lukus Binion

## 2018-04-02 NOTE — Discharge Instructions (Signed)
POST OP INSTRUCTIONS  1. DIET: As tolerated. Follow a light bland diet the first 24 hours after arrival home, such as soup, liquids, crackers, etc.  Be sure to include lots of fluids daily.  Avoid fast food or heavy meals as your are more likely to get nauseated.  Eat a low fat the next few days after surgery.  2. Take your usually prescribed home medications unless otherwise directed.  3. PAIN CONTROL: a. Pain is best controlled by a usual combination of three different methods TOGETHER: i. Ice/Heat ii. Over the counter pain medication iii. Prescription pain medication b. Most patients will experience some swelling and bruising around the surgical site - this is particularly to be expected after an umbilical hernia repair.  Ice packs or heating pads (30-60 minutes up to 6 times a day) will help. Some people prefer to use ice alone, heat alone, alternating between ice & heat.  Experiment to what works for you.  Swelling and bruising can take several weeks to resolve.   c. It is helpful to take an over-the-counter pain medication regularly for the first few weeks: i. Ibuprofen (Motrin/Advil) - 200mg  tabs - take 3 tabs (600mg ) every 6 hours as needed for pain ii. Acetaminophen (Tylenol) - you may take 650mg  every 6 hours as needed. You can take this with motrin as they act differently on the body. If you are taking a narcotic pain medication that has acetaminophen in it, do not take over the counter tylenol at the same time.  Iii. NOTE: You may take both of these medications together - most patients  find it most helpful when alternating between the two (i.e. Ibuprofen at 6am,  tylenol at 9am, ibuprofen at 12pm ...) d. A  prescription for pain medication should be given to you upon discharge.  Take your pain medication as prescribed if your pain is not adequatly controlled with the over-the-counter pain reliefs mentioned above.  4. Avoid getting constipated.  Between the surgery and the pain  medications, it is common to experience some constipation.  Increasing fluid intake and taking a fiber supplement (such as Metamucil, Citrucel, FiberCon, MiraLax, etc) 1-2 times a day regularly will usually help prevent this problem from occurring.  A mild laxative (prune juice, Milk of Magnesia, MiraLax, etc) should be taken according to package directions if there are no bowel movements after 48 hours.    5. Dressing: Your incision is covered with a dressing which you may remove in 2 days. Underneath this, your incision is covered in Dermabond which is like sterile superglue for the skin. This will come off on it's own in a couple weeks. It is waterproof and you may bathe normally starting the day after your surgery in a shower. Avoid baths/pools/lakes/oceans until your wounds have fully healed.  6. ACTIVITIES as tolerated:   a. Avoid heavy lifting (>10lbs or 1 gallon of milk) for the next 6 weeks. b. You may resume regular (light) daily activities beginning the next day--such as daily self-care, walking, climbing stairs--gradually increasing activities as tolerated.  If you can walk 30 minutes without difficulty, it is safe to try more intense activity such as jogging, treadmill, bicycling, low-impact aerobics.  c. DO NOT PUSH THROUGH PAIN.  Let pain be your guide: If it hurts to do something, don't do it. d. Bonita Quin may drive when you are no longer taking prescription pain medication, you can comfortably wear a seatbelt, and you can safely maneuver your car and apply brakes. e. Bonita Quin  may have sexual intercourse when it is comfortable.   7. FOLLOW UP in our office a. Please call [facility] at [phone] to set up an appointment to see your surgeon in the office for a follow-up appointment approximately 2 weeks after your surgery. b. Make sure that you call for this appointment the day you arrive home to insure a convenient appointment time.  9. If you have disability or family leave forms that need to be  completed, you may have them completed by your primary care physician's office; for return to work instructions, please ask our office staff and they will be happy to assist you in obtaining this documentation   When to call us [phone]: 1. Poor pain control 2. Reactions / problems with new medications (rash/itching, etc)  3. Fever over 101.5 F (38.5 C) 4. Inability to urinate 5. Nausea/vomiting 6. Worsening swelling or bruising 7. Continued bleeding from incision. 8. Increased pain, redness, or drainage from the incision  The clinic staff is available to answer your questions during regular business hours (8:30am-5pm).  Please dont hesitate to call and ask to speak to one of our nurses for clinical concerns.   A surgeon from [facility] is always on call at the hospitals   If you have a medical emergency, go to the nearest emergency room or call 911.  [facility] [address], [address], [place], [place]  [address] MAIN: [phone] FAX: [phone] [email or web address]

## 2018-04-03 ENCOUNTER — Encounter (HOSPITAL_COMMUNITY): Payer: Self-pay | Admitting: Surgery

## 2018-04-04 ENCOUNTER — Other Ambulatory Visit: Payer: Self-pay | Admitting: Medical

## 2018-04-14 ENCOUNTER — Other Ambulatory Visit: Payer: Self-pay | Admitting: Medical

## 2018-04-22 DIAGNOSIS — C4449 Other specified malignant neoplasm of skin of scalp and neck: Secondary | ICD-10-CM | POA: Diagnosis not present

## 2018-04-23 ENCOUNTER — Encounter (HOSPITAL_COMMUNITY): Payer: Self-pay

## 2018-04-23 ENCOUNTER — Telehealth: Payer: Self-pay | Admitting: Medical

## 2018-04-23 ENCOUNTER — Encounter: Payer: Self-pay | Admitting: Medical

## 2018-04-23 DIAGNOSIS — F3341 Major depressive disorder, recurrent, in partial remission: Secondary | ICD-10-CM

## 2018-04-23 DIAGNOSIS — E785 Hyperlipidemia, unspecified: Secondary | ICD-10-CM

## 2018-04-23 MED ORDER — SUMATRIPTAN SUCCINATE 50 MG PO TABS: 50.0000 mg | ORAL_TABLET | ORAL | 1 refills | Status: DC | PRN

## 2018-04-23 MED ORDER — QUETIAPINE FUMARATE 50 MG PO TABS: 100.0000 mg | ORAL_TABLET | Freq: Every day | ORAL | 1 refills | Status: AC

## 2018-04-23 MED ORDER — FLUOXETINE HCL 40 MG PO CAPS: 80.0000 mg | ORAL_CAPSULE | Freq: Every day | ORAL | 1 refills | Status: AC

## 2018-04-23 MED ORDER — LORAZEPAM 0.5 MG PO TABS: 0.5000 mg | ORAL_TABLET | Freq: Two times a day (BID) | ORAL | 1 refills | Status: AC | PRN

## 2018-04-23 NOTE — Telephone Encounter (Signed)
Future lipid panel and cmp placed. Refilled pt imtrex as well.

## 2018-04-25 ENCOUNTER — Encounter (HOSPITAL_COMMUNITY): Payer: Self-pay

## 2018-04-25 ENCOUNTER — Other Ambulatory Visit (HOSPITAL_COMMUNITY): Payer: Self-pay | Admitting: Psychiatry

## 2018-04-25 DIAGNOSIS — F3341 Major depressive disorder, recurrent, in partial remission: Secondary | ICD-10-CM

## 2018-04-25 DIAGNOSIS — G4730 Sleep apnea, unspecified: Secondary | ICD-10-CM

## 2018-04-28 ENCOUNTER — Other Ambulatory Visit (INDEPENDENT_AMBULATORY_CARE_PROVIDER_SITE_OTHER): Payer: BLUE CROSS/BLUE SHIELD

## 2018-04-28 DIAGNOSIS — E785 Hyperlipidemia, unspecified: Secondary | ICD-10-CM

## 2018-04-28 LAB — COMPREHENSIVE METABOLIC PANEL
ALK PHOS: 67 U/L (ref 39–117)
ALT: 28 U/L (ref 0–53)
AST: 17 U/L (ref 0–37)
Albumin: 4.4 g/dL (ref 3.5–5.2)
BUN: 21 mg/dL (ref 6–23)
CHLORIDE: 102 meq/L (ref 96–112)
CO2: 29 mEq/L (ref 19–32)
CREATININE: 1.69 mg/dL — AB (ref 0.40–1.50)
Calcium: 9.8 mg/dL (ref 8.4–10.5)
GFR: 46.3 mL/min — ABNORMAL LOW (ref >60.00)
GLUCOSE: 95 mg/dL (ref 70–99)
POTASSIUM: 4 meq/L (ref 3.5–5.1)
SODIUM: 140 meq/L (ref 135–145)
TOTAL PROTEIN: 6.7 g/dL (ref 6.0–8.3)
Total Bilirubin: 0.5 mg/dL (ref 0.2–1.2)

## 2018-04-28 LAB — LIPID PANEL
CHOL/HDL RATIO: 5
Cholesterol: 191 mg/dL (ref 0–200)
HDL: 36 mg/dL — ABNORMAL LOW (ref >39.00)
Triglycerides: 518 mg/dL — ABNORMAL HIGH (ref 0.0–149.0)

## 2018-04-28 LAB — LDL CHOLESTEROL, DIRECT: LDL DIRECT: 99 mg/dL

## 2018-04-29 ENCOUNTER — Telehealth: Payer: Self-pay | Admitting: Medical

## 2018-04-29 MED ORDER — FENOFIBRATE 48 MG PO TABS: 48.0000 mg | ORAL_TABLET | Freq: Every day | ORAL | 3 refills | Status: DC

## 2018-04-29 NOTE — Telephone Encounter (Signed)
Rx fenofibrate sent to pharmacy.

## 2018-04-30 DIAGNOSIS — L814 Other melanin hyperpigmentation: Secondary | ICD-10-CM | POA: Diagnosis not present

## 2018-04-30 DIAGNOSIS — L918 Other hypertrophic disorders of the skin: Secondary | ICD-10-CM | POA: Diagnosis not present

## 2018-04-30 DIAGNOSIS — L905 Scar conditions and fibrosis of skin: Secondary | ICD-10-CM | POA: Diagnosis not present

## 2018-04-30 DIAGNOSIS — D225 Melanocytic nevi of trunk: Secondary | ICD-10-CM | POA: Diagnosis not present

## 2018-05-01 ENCOUNTER — Encounter (HOSPITAL_COMMUNITY): Payer: Self-pay | Admitting: Psychiatry

## 2018-05-01 ENCOUNTER — Encounter: Payer: Self-pay | Admitting: Medical

## 2018-05-01 ENCOUNTER — Ambulatory Visit (INDEPENDENT_AMBULATORY_CARE_PROVIDER_SITE_OTHER): Payer: BLUE CROSS/BLUE SHIELD | Admitting: Psychiatry

## 2018-05-01 VITALS — BP 128/74 | HR 66 | Ht 69.0 in | Wt 212.0 lb

## 2018-05-01 DIAGNOSIS — F3341 Major depressive disorder, recurrent, in partial remission: Secondary | ICD-10-CM

## 2018-05-01 MED ORDER — ZOLPIDEM TARTRATE 10 MG PO TABS: 10.0000 mg | ORAL_TABLET | Freq: Every evening | ORAL | 1 refills | Status: AC | PRN

## 2018-05-01 NOTE — Progress Notes (Signed)
[facility] MD/PA/NP OP Progress Note  05/01/2018 4:35 PM George Ward  MRN:  [id number]  Chief Complaint: Med management HPI: George Ward reports his mood has been okay considering the multiple medical stressors he has had recently.  He has had multiple dental issues, then a skin cancer scare and had to have a Mohs surgery.  He reports that he has been able to maintain his mood, but his sleep is suffered a little bit.  We agreed to use Ambien in the short-term to help with getting his sleep back on to her routine schedule.  He denies any acute safety issues or suicidality and reports things in his marriage are going much better.  Disclosed to patient that this Clinical research associate is leaving this practice at the end of August 2019, and patients always has the right to choose their provider. Reassured patient that office will work to provide smooth transition of care whether they wish to remain at this office, or to continue with this provider, or seek alternative care options in community.  They expressed understanding.   Visit Diagnosis:    ICD-10-CM   1. Recurrent major depressive disorder, in partial remission (HCC) F33.41 zolpidem (AMBIEN) 10 MG tablet    Past Psychiatric History: See intake H&P for full details. Reviewed, with no updates at this time.   Past Medical History:  Past Medical History:  Diagnosis Date  . Alcohol abuse    Remission >20 years ago  . Allergy   . Anxiety   . Asthma   . Depression    MAJOR DEPRESSIVE DISORDER  . Depression with anxiety   . Drug abuse (HCC)    Remission >20 years ago  . Encounter for HIV (human immunodeficiency virus) test 2014-2015  . Environmental allergies   . History of chicken pox   . Hyperlipidemia   . Hypertension   . Hypoalphalipoproteinemia, familial   . Lumbar pain   . Migraines   . Primary generalized (osteo)arthritis   . Seasonal allergies     Past Surgical History:  Procedure Laterality Date  . EXCISION MASS HEAD  03/17/2018   excision cells on head  ; still in testing for cancer per patient report   . LUMBAR EPIDURAL INJECTION    . UMBILICAL HERNIA REPAIR N/A 04/02/2018   Procedure: OPEN UMBILICAL HERNIA REPAIR WITH INSERTION OF MESH;  Surgeon: Andria Meuse, MD;  Location: [facility];  Service: General;  Laterality: N/A;  . WISDOM TOOTH EXTRACTION  1989-1990    Family Psychiatric History: See intake H&P for full details. Reviewed, with no updates at this time.   Family History:  Family History  Problem Relation Age of Onset  . Hypertension Mother   . Hyperlipidemia Father   . Healthy Sister   . Colon cancer Maternal Grandfather   . Prostate cancer Maternal Grandfather   . Congestive Heart Failure Maternal Grandfather   . Dementia Paternal Grandmother   . Heart disease Paternal Grandfather   . Heart attack Paternal Grandfather   . Hypertension Maternal Uncle   . Hyperlipidemia Maternal Uncle   . Hepatitis Paternal Aunt   . Hepatitis C Paternal Aunt   . Hypertension Paternal Uncle   . Hyperlipidemia Paternal Uncle   . Healthy Son        x1  . Healthy Daughter        x4    Social History:  Social History   Socioeconomic History  . Marital status: Married    Spouse name: Not on  file  . Number of children: Not on file  . Years of education: Not on file  . Highest education level: Not on file  Occupational History  . Not on file  Social Needs  . Financial resource strain: Not on file  . Food insecurity:    Worry: Not on file    Inability: Not on file  . Transportation needs:    Medical: Not on file    Non-medical: Not on file  Tobacco Use  . Smoking status: Former Smoker    Types: Cigarettes    Last attempt to quit: 03/07/1988    Years since quitting: 30.1  . Smokeless tobacco: Former Engineer, water and Sexual Activity  . Alcohol use: Not Currently  . Drug use: Not Currently  . Sexual activity: Yes  Lifestyle  . Physical activity:    Days per week: Not on file    Minutes per  session: Not on file  . Stress: Not on file  Relationships  . Social connections:    Talks on phone: Not on file    Gets together: Not on file    Attends religious service: Not on file    Active member of club or organization: Not on file    Attends meetings of clubs or organizations: Not on file    Relationship status: Not on file  Other Topics Concern  . Not on file  Social History Narrative  . Not on file    Allergies:  Allergies  Allergen Reactions  . Co-Trimoxazole [Sulfamethoxazole-Trimethoprim] Nausea And Vomiting  . Lisinopril Cough    With Wheezing   . Sulfa Antibiotics Nausea And Vomiting    Metabolic Disorder Labs: No results found for: HGBA1C, MPG No results found for: PROLACTIN Lab Results  Component Value Date   CHOL 191 04/28/2018   TRIG (H) 04/28/2018    518.0 Triglyceride is over 400; calculations on Lipids are invalid.   HDL 36.00 (L) 04/28/2018   CHOLHDL 5 04/28/2018   VLDL 32.6 08/22/2017   LDLCALC 77 08/22/2017   LDLCALC 100 (H) 04/26/2017   Lab Results  Component Value Date   TSH 2.330 08/13/2017    Therapeutic Level Labs: No results found for: LITHIUM No results found for: VALPROATE No components found for:  CBMZ  Current Medications: Current Outpatient Medications  Medication Sig Dispense Refill  . acetaminophen (TYLENOL) 500 MG tablet Take 1,000 mg by mouth daily as needed for moderate pain or headache.    . albuterol (PROVENTIL HFA;VENTOLIN HFA) 108 (90 Base) MCG/ACT inhaler Inhale 1-2 puffs into the lungs every 6 (six) hours as needed for wheezing or shortness of breath. Ventolin    . atorvastatin (LIPITOR) 20 MG tablet TAKE 1 TABLET BY MOUTH EVERY DAY 90 tablet 0  . calcium carbonate (TUMS - DOSED IN MG ELEMENTAL CALCIUM) 500 MG chewable tablet Chew 2 tablets by mouth daily as needed for indigestion or heartburn.    . fenofibrate (TRICOR) 48 MG tablet Take 1 tablet (48 mg total) by mouth daily. 30 tablet 3  . FLUoxetine (PROZAC) 40  MG capsule Take 2 capsules (80 mg total) by mouth daily. 180 capsule 1  . ibuprofen (ADVIL,MOTRIN) 200 MG tablet Take 400 mg by mouth daily as needed for headache or moderate pain.    Marland Kitchen loratadine (CLARITIN) 10 MG tablet Take 10 mg by mouth daily as needed for allergies.    Marland Kitchen LORazepam (ATIVAN) 0.5 MG tablet Take 1 tablet (0.5 mg total) by mouth 2 (two) times  daily as needed for anxiety. 60 tablet 1  . Multiple Vitamin (MULTIVITAMIN WITH MINERALS) TABS tablet Take 1 tablet by mouth daily.    . QUEtiapine (SEROQUEL) 50 MG tablet Take 2 tablets (100 mg total) by mouth at bedtime. 180 tablet 1  . SUMAtriptan (IMITREX) 50 MG tablet Take 1 tablet (50 mg total) by mouth every 2 (two) hours as needed for migraine. May repeat in 2 hours if headache persists or recurs. 10 tablet 1  . triamterene-hydrochlorothiazide (MAXZIDE-25) 37.5-25 MG tablet TAKE 1 TABLET BY MOUTH EVERY DAY 90 tablet 1  . zolpidem (AMBIEN) 10 MG tablet Take 1 tablet (10 mg total) by mouth at bedtime as needed for sleep. 30 tablet 1   No current facility-administered medications for this visit.     Musculoskeletal: Strength & Muscle Tone: within normal limits Gait & Station: normal Patient leans: N/A  Psychiatric Specialty Exam: ROS  Blood pressure 128/74, pulse 66, height 5\' 9"  (1.753 m), weight 212 lb (96.2 kg).Body mass index is 31.31 kg/m.  General Appearance: Casual and Well Groomed  Eye Contact:  Good  Speech:  Clear and Coherent and Normal Rate  Volume:  Normal  Mood:  Euthymic  Affect:  Appropriate and Congruent  Thought Process:  Goal Directed and Descriptions of Associations: Intact  Orientation:  Full (Time, Place, and Person)  Thought Content: Logical   Suicidal Thoughts:  No  Homicidal Thoughts:  No  Memory:  Immediate;   Fair  Judgement:  Good  Insight:  Good  Psychomotor Activity:  Normal  Concentration:  Concentration: Good  Recall:  Good  Fund of Knowledge: Good  Language: Good  Akathisia:   Negative  Handed:  Right  AIMS (if indicated): not done  Assets:  Communication Skills Desire for Improvement Financial Resources/Insurance Housing Intimacy Leisure Time Physical Health Resilience Social Support Talents/Skills Transportation Vocational/Educational  ADL's:  Intact  Cognition: WNL  Sleep:  snoring, poor quality   Screenings:   Assessment and Plan:  George Ward presents with overall stability of depressive symptoms even in the context of medical stressors.  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  No acute safety issues and patient is aware that writer is transitioning out of clinic next week.  1. Recurrent major depressive disorder, in partial remission (HCC)     Status of current problems: stable  Labs Ordered: No orders of the defined types were placed in this encounter.   Labs Reviewed: N/A  Collateral Obtained/Records Reviewed: N/A  Plan:  Home sleep study ordered Continue Prozac 80 mg ambien 10 mg qhs prn Ativan 0.5 mg twice a day as needed Continue Seroquel 50-100 mg nightly   Burnard LeighAlexander Arya Robena Ewy, MD 05/01/2018, 4:35 PM

## 2018-05-27 ENCOUNTER — Other Ambulatory Visit (INDEPENDENT_AMBULATORY_CARE_PROVIDER_SITE_OTHER): Payer: BLUE CROSS/BLUE SHIELD

## 2018-05-27 ENCOUNTER — Encounter: Payer: Self-pay | Admitting: Medical

## 2018-05-27 ENCOUNTER — Encounter (HOSPITAL_BASED_OUTPATIENT_CLINIC_OR_DEPARTMENT_OTHER): Payer: Self-pay

## 2018-05-27 DIAGNOSIS — N289 Disorder of kidney and ureter, unspecified: Secondary | ICD-10-CM | POA: Diagnosis not present

## 2018-05-27 LAB — COMPREHENSIVE METABOLIC PANEL
ALBUMIN: 4.4 g/dL (ref 3.5–5.2)
ALT: 26 U/L (ref 0–53)
AST: 20 U/L (ref 0–37)
Alkaline Phosphatase: 76 U/L (ref 39–117)
BILIRUBIN TOTAL: 0.7 mg/dL (ref 0.2–1.2)
BUN: 16 mg/dL (ref 6–23)
CHLORIDE: 101 meq/L (ref 96–112)
CO2: 29 mEq/L (ref 19–32)
CREATININE: 1.06 mg/dL (ref 0.40–1.50)
Calcium: 9.6 mg/dL (ref 8.4–10.5)
GFR: 79.29 mL/min (ref >60.00)
Glucose, Bld: 132 mg/dL — ABNORMAL HIGH (ref 70–99)
Potassium: 3.5 mEq/L (ref 3.5–5.1)
Sodium: 141 mEq/L (ref 135–145)
Total Protein: 6.8 g/dL (ref 6.0–8.3)

## 2018-05-28 ENCOUNTER — Telehealth: Payer: Self-pay | Admitting: Medical

## 2018-05-28 DIAGNOSIS — E785 Hyperlipidemia, unspecified: Secondary | ICD-10-CM

## 2018-05-28 NOTE — Telephone Encounter (Signed)
Future lipid panel placed. 

## 2018-07-07 ENCOUNTER — Other Ambulatory Visit: Payer: Self-pay | Admitting: Medical

## 2018-07-07 DIAGNOSIS — F332 Major depressive disorder, recurrent severe without psychotic features: Secondary | ICD-10-CM | POA: Diagnosis not present

## 2018-07-25 ENCOUNTER — Other Ambulatory Visit: Payer: Self-pay | Admitting: Medical

## 2018-07-29 ENCOUNTER — Encounter: Payer: Self-pay | Admitting: Medical

## 2018-08-01 ENCOUNTER — Encounter: Payer: Self-pay | Admitting: Medical

## 2018-08-01 ENCOUNTER — Ambulatory Visit: Payer: BLUE CROSS/BLUE SHIELD | Admitting: Medical

## 2018-08-01 VITALS — BP 128/98 | HR 88 | Temp 98.5°F | Resp 16 | Ht 69.0 in | Wt 202.6 lb

## 2018-08-01 DIAGNOSIS — J029 Acute pharyngitis, unspecified: Secondary | ICD-10-CM

## 2018-08-01 DIAGNOSIS — J3489 Other specified disorders of nose and nasal sinuses: Secondary | ICD-10-CM

## 2018-08-01 DIAGNOSIS — R059 Cough, unspecified: Secondary | ICD-10-CM

## 2018-08-01 DIAGNOSIS — R05 Cough: Secondary | ICD-10-CM

## 2018-08-01 MED ORDER — AMOXICILLIN-POT CLAVULANATE 875-125 MG PO TABS: 1.0000 | ORAL_TABLET | Freq: Two times a day (BID) | ORAL | 0 refills | Status: DC

## 2018-08-01 MED ORDER — FLUTICASONE PROPIONATE 50 MCG/ACT NA SUSP: 2.0000 | Freq: Every day | NASAL | 1 refills | Status: DC

## 2018-08-01 MED ORDER — BENZONATATE 100 MG PO CAPS: 100.0000 mg | ORAL_CAPSULE | Freq: Three times a day (TID) | ORAL | 0 refills | Status: DC | PRN

## 2018-08-01 NOTE — Patient Instructions (Addendum)
Your strep test was negative. However, your physical exam and clinical presentation is suspicious for strep and it is important to note that rapid strep test can be falsely negative. So I am going to give you Augmentin antibiotic today based on your exam and clinical presentation.  For cough, I prescribed benzonatate and for nasal congestion I prescribed Flonase.  On exam and recent history some concern for possible sinus infection and also bronchitis.  You did mention some intermittent mild abdomen pain when swallowing.  At this pain becomes more constant/prevalent then recommend getting Tagamet over-the-counter.  2 days of mild loose stools on Wednesday and Thursday.  No loose stools today.  We discussed side effects of antibiotics.  Recommend probiotics over the next week.  If diarrhea becomes worse or watery please let me know.  Rest hydrate, tylenol for fever, and warm salt water gargles.   Follow up in 7 days or as needed.

## 2018-08-01 NOTE — Progress Notes (Signed)
Subjective:    Patient ID: George Ward, male    DOB: 1971-06-26, 47 y.o.   MRN: [id number]  HPI  Pt in sick since last Friday. He states at first had pnd, tickle to throat then got nasal congestion, sinus pressure, and soar throat.   Pt states has had fever, chills and sweats early on. Cough mostly dry will occasionally bring up mucus.  Pt states early on had some bodyaches and leg achy on weekend.    Review of Systems  Constitutional: Positive for chills, fatigue and fever.  HENT: Positive for congestion, sinus pressure and sinus pain. Negative for mouth sores.   Respiratory: Positive for cough. Negative for chest tightness, shortness of breath and wheezing.   Cardiovascular: Negative for palpitations and leg swelling.  Gastrointestinal: Positive for abdominal pain.       Some burning pain when swallows at times.   Last 2 days some loose stools but none today.  Genitourinary: Negative for dysuria, flank pain and frequency.  Neurological: Negative for dizziness, seizures and headaches.  Hematological: Positive for adenopathy.  Psychiatric/Behavioral: Negative for behavioral problems, confusion, dysphoric mood and hallucinations. The patient is not nervous/anxious.     Past Medical History:  Diagnosis Date  . Alcohol abuse    Remission >20 years ago  . Allergy   . Anxiety   . Asthma   . Depression    MAJOR DEPRESSIVE DISORDER  . Depression with anxiety   . Drug abuse (HCC)    Remission >20 years ago  . Encounter for HIV (human immunodeficiency virus) test 2014-2015  . Environmental allergies   . History of chicken pox   . Hyperlipidemia   . Hypertension   . Hypoalphalipoproteinemia, familial   . Lumbar pain   . Migraines   . Primary generalized (osteo)arthritis   . Seasonal allergies      Social History   Socioeconomic History  . Marital status: Married    Spouse name: Not on file  . Number of children: Not on file  . Years of education: Not on file    . Highest education level: Not on file  Occupational History  . Not on file  Social Needs  . Financial resource strain: Not on file  . Food insecurity:    Worry: Not on file    Inability: Not on file  . Transportation needs:    Medical: Not on file    Non-medical: Not on file  Tobacco Use  . Smoking status: Former Smoker    Types: Cigarettes    Last attempt to quit: 03/07/1988    Years since quitting: 30.4  . Smokeless tobacco: Former Engineer, water and Sexual Activity  . Alcohol use: Not Currently  . Drug use: Not Currently  . Sexual activity: Yes  Lifestyle  . Physical activity:    Days per week: Not on file    Minutes per session: Not on file  . Stress: Not on file  Relationships  . Social connections:    Talks on phone: Not on file    Gets together: Not on file    Attends religious service: Not on file    Active member of club or organization: Not on file    Attends meetings of clubs or organizations: Not on file    Relationship status: Not on file  . Intimate partner violence:    Fear of current or ex partner: Not on file    Emotionally abused: Not on file  Physically abused: Not on file    Forced sexual activity: Not on file  Other Topics Concern  . Not on file  Social History Narrative  . Not on file    Past Surgical History:  Procedure Laterality Date  . EXCISION MASS HEAD  03/17/2018   excision cells on head  ; still in testing for cancer per patient report   . LUMBAR EPIDURAL INJECTION    . UMBILICAL HERNIA REPAIR N/A 04/02/2018   Procedure: OPEN UMBILICAL HERNIA REPAIR WITH INSERTION OF MESH;  Surgeon: Andria Meuse, MD;  Location: [facility];  Service: General;  Laterality: N/A;  . WISDOM TOOTH EXTRACTION  1989-1990    Family History  Problem Relation Age of Onset  . Hypertension Mother   . Hyperlipidemia Father   . Healthy Sister   . Colon cancer Maternal Grandfather   . Prostate cancer Maternal Grandfather   . Congestive Heart Failure  Maternal Grandfather   . Dementia Paternal Grandmother   . Heart disease Paternal Grandfather   . Heart attack Paternal Grandfather   . Hypertension Maternal Uncle   . Hyperlipidemia Maternal Uncle   . Hepatitis Paternal Aunt   . Hepatitis C Paternal Aunt   . Hypertension Paternal Uncle   . Hyperlipidemia Paternal Uncle   . Healthy Son        x1  . Healthy Daughter        x4    Allergies  Allergen Reactions  . Co-Trimoxazole [Sulfamethoxazole-Trimethoprim] Nausea And Vomiting  . Lisinopril Cough    With Wheezing   . Sulfa Antibiotics Nausea And Vomiting    Current Outpatient Medications on File Prior to Visit  Medication Sig Dispense Refill  . acetaminophen (TYLENOL) 500 MG tablet Take 1,000 mg by mouth daily as needed for moderate pain or headache.    . albuterol (PROVENTIL HFA;VENTOLIN HFA) 108 (90 Base) MCG/ACT inhaler Inhale 1-2 puffs into the lungs every 6 (six) hours as needed for wheezing or shortness of breath. Ventolin    . atorvastatin (LIPITOR) 20 MG tablet TAKE 1 TABLET BY MOUTH EVERY DAY 90 tablet 0  . calcium carbonate (TUMS - DOSED IN MG ELEMENTAL CALCIUM) 500 MG chewable tablet Chew 2 tablets by mouth daily as needed for indigestion or heartburn.    . fenofibrate (TRICOR) 48 MG tablet TAKE 1 TABLET BY MOUTH EVERY DAY 90 tablet 1  . FLUoxetine (PROZAC) 40 MG capsule Take 2 capsules (80 mg total) by mouth daily. 180 capsule 1  . ibuprofen (ADVIL,MOTRIN) 200 MG tablet Take 400 mg by mouth daily as needed for headache or moderate pain.    Marland Kitchen loratadine (CLARITIN) 10 MG tablet Take 10 mg by mouth daily as needed for allergies.    Marland Kitchen LORazepam (ATIVAN) 0.5 MG tablet Take 1 tablet (0.5 mg total) by mouth 2 (two) times daily as needed for anxiety. 60 tablet 1  . Multiple Vitamin (MULTIVITAMIN WITH MINERALS) TABS tablet Take 1 tablet by mouth daily.    . QUEtiapine (SEROQUEL) 50 MG tablet Take 2 tablets (100 mg total) by mouth at bedtime. 180 tablet 1  . SUMAtriptan  (IMITREX) 50 MG tablet Take 1 tablet (50 mg total) by mouth every 2 (two) hours as needed for migraine. May repeat in 2 hours if headache persists or recurs. 10 tablet 1  . triamterene-hydrochlorothiazide (MAXZIDE-25) 37.5-25 MG tablet TAKE 1 TABLET BY MOUTH EVERY DAY 90 tablet 1  . zolpidem (AMBIEN) 10 MG tablet Take 1 tablet (10 mg total) by mouth  at bedtime as needed for sleep. 30 tablet 1   No current facility-administered medications on file prior to visit.     BP (!) 128/98   Pulse 88   Temp 98.5 F (36.9 C) (Oral)   Resp 16   Ht 5\' 9"  (1.753 m)   Wt 202 lb 9.6 oz (91.9 kg)   SpO2 98%   BMI 29.92 kg/m       Objective:   Physical Exam   General  Mental Status - Alert. General Appearance - Well groomed. Not in acute distress.  Skin Rashes- No Rashes.  HEENT Head- Normal. Ear Auditory Canal - Left- Normal. Right - Normal.Tympanic Membrane- Left- Normal. Right- Normal. Eye Sclera/Conjunctiva- Left- Normal. Right- Normal. Nose & Sinuses Nasal Mucosa- Left-  Boggy and Congested. Right-  Boggy and  Congested.Bilateral  Mild maxillary and mild  frontal sinus pressure. Mouth & Throat Lips: Upper Lip- Normal: no dryness, cracking, pallor, cyanosis, or vesicular eruption. Lower Lip-Normal: no dryness, cracking, pallor, cyanosis or vesicular eruption. Buccal Mucosa- Bilateral- No Aphthous ulcers. Oropharynx- No Discharge or Erythema. Tonsils: Characteristics- Bilateral- moderate Erythema. Size/Enlargement- Bilateral- 1-2+ enlargement. Discharge- bilateral-None.  Neck Neck- Supple. No Masses. See lymphatic exam.   Chest and Lung Exam Auscultation: Breath Sounds:-Clear even and unlabored.  Cardiovascular Auscultation:Rythm- Regular, rate and rhythm. Murmurs & Other Heart Sounds:Ausculatation of the heart reveal- No Murmurs.  Lymphatic Head & Neck General Head & Neck Lymphatics: Bilateral: Description- moderate enlarged submandibular nodes.  Abdomen- soft,  nontender, nondistended,+bs. No rebound or guarding.  Back- no cva tenderness.      Assessment & Plan:  Your strep test was negative. However, your physical exam and clinical presentation is suspicious for strep and it is important to note that rapid strep test can be falsely negative. So I am going to give you Augmentin antibiotic today based on your exam and clinical presentation.  For cough, I prescribed benzonatate and for nasal congestion I prescribed Flonase.  On exam and recent history some concern for possible sinus infection and also bronchitis.  You did mention some intermittent mild abdomen pain when swallowing.  At this pain becomes more constant/prevalent then recommend getting Tagamet over-the-counter.  2 days of mild loose stools on Wednesday and Thursday.  No loose stools today.  We discussed side effects of antibiotics.  Recommend probiotics over the next week.  If diarrhea becomes worse or watery please let me know.  Rest hydrate, tylenol for fever, and warm salt water gargles.   Follow up in 7 days or as needed.   Esperanza RichtersEdward Medardo Hassing, PA-C

## 2018-08-07 ENCOUNTER — Encounter: Payer: Self-pay | Admitting: Medical

## 2018-08-25 DIAGNOSIS — F332 Major depressive disorder, recurrent severe without psychotic features: Secondary | ICD-10-CM | POA: Diagnosis not present

## 2018-08-28 ENCOUNTER — Ambulatory Visit (HOSPITAL_BASED_OUTPATIENT_CLINIC_OR_DEPARTMENT_OTHER)
Admission: RE | Admit: 2018-08-28 | Discharge: 2018-08-28 | Disposition: A | Discharge status: Home / Self Care | Payer: BLUE CROSS/BLUE SHIELD | Source: Ambulatory Visit | Attending: Medical | Admitting: Medical

## 2018-08-28 ENCOUNTER — Other Ambulatory Visit: Payer: Self-pay | Admitting: Medical

## 2018-08-28 ENCOUNTER — Ambulatory Visit: Payer: BLUE CROSS/BLUE SHIELD | Admitting: Medical

## 2018-08-28 ENCOUNTER — Encounter: Payer: Self-pay | Admitting: Medical

## 2018-08-28 VITALS — BP 115/95 | HR 77 | Temp 98.2°F | Resp 16 | Ht 69.0 in | Wt 211.2 lb

## 2018-08-28 DIAGNOSIS — J40 Bronchitis, not specified as acute or chronic: Secondary | ICD-10-CM

## 2018-08-28 DIAGNOSIS — R062 Wheezing: Secondary | ICD-10-CM | POA: Diagnosis not present

## 2018-08-28 DIAGNOSIS — R05 Cough: Secondary | ICD-10-CM | POA: Diagnosis not present

## 2018-08-28 DIAGNOSIS — J0100 Acute maxillary sinusitis, unspecified: Secondary | ICD-10-CM | POA: Diagnosis not present

## 2018-08-28 DIAGNOSIS — J029 Acute pharyngitis, unspecified: Secondary | ICD-10-CM | POA: Diagnosis not present

## 2018-08-28 MED ORDER — DOXYCYCLINE HYCLATE 100 MG PO TABS: 100.0000 mg | ORAL_TABLET | Freq: Two times a day (BID) | ORAL | 0 refills | Status: DC

## 2018-08-28 MED ORDER — FLUTICASONE PROPIONATE HFA 110 MCG/ACT IN AERO: 2.0000 | INHALATION_SPRAY | Freq: Two times a day (BID) | RESPIRATORY_TRACT | 12 refills | Status: DC

## 2018-08-28 MED ORDER — BENZONATATE 100 MG PO CAPS: 100.0000 mg | ORAL_CAPSULE | Freq: Three times a day (TID) | ORAL | 0 refills | Status: DC | PRN

## 2018-08-28 MED ORDER — PREDNISONE 10 MG PO TABS: ORAL_TABLET | ORAL | 0 refills | Status: DC

## 2018-08-28 MED ORDER — FLUTICASONE PROPIONATE 50 MCG/ACT NA SUSP: 2.0000 | Freq: Every day | NASAL | 1 refills | Status: DC

## 2018-08-28 NOTE — Progress Notes (Signed)
Subjective:    Patient ID: George Ward, male    DOB: [birth year], 47 y.o.   MRN: [id number]  HPI  Pt in for evaluation.  Pt seen at end of November. His st got better. He had sinus pressure and dry cough as well..   Pt states his cough never got better completely. It has been dry intermittent for one month. He states he states has been wheezing and having to use albuterol 2 puffs twice daily. Pt used to be on steroid inhaler 5 years ago. In the past he also had neb machine.  His nasal congestion and sinus pressure returning over past 5 days.  Pt used stopped smoking in 1989. Only smoked 2 years.  Pt would wheeze as youth when got sick.   Review of Systems  Constitutional: Negative for chills, fatigue and fever.  HENT: Positive for congestion. Negative for postnasal drip and sore throat.   Respiratory: Positive for cough and wheezing. Negative for chest tightness and shortness of breath.   Cardiovascular: Negative for chest pain and palpitations.  Musculoskeletal: Negative for back pain.  Skin: Negative for rash.  Neurological: Negative for dizziness and headaches.  Hematological: Negative for adenopathy. Does not bruise/bleed easily.  Psychiatric/Behavioral: Negative for behavioral problems, confusion and dysphoric mood.    Past Medical History:  Diagnosis Date  . Alcohol abuse    Remission >20 years ago  . Allergy   . Anxiety   . Asthma   . Depression    MAJOR DEPRESSIVE DISORDER  . Depression with anxiety   . Drug abuse (HCC)    Remission >20 years ago  . Encounter for HIV (human immunodeficiency virus) test 2014-2015  . Environmental allergies   . History of chicken pox   . Hyperlipidemia   . Hypertension   . Hypoalphalipoproteinemia, familial   . Lumbar pain   . Migraines   . Primary generalized (osteo)arthritis   . Seasonal allergies      Social History   Socioeconomic History  . Marital status: Married    Spouse name: Not on file  . Number of  children: Not on file  . Years of education: Not on file  . Highest education level: Not on file  Occupational History  . Not on file  Social Needs  . Financial resource strain: Not on file  . Food insecurity:    Worry: Not on file    Inability: Not on file  . Transportation needs:    Medical: Not on file    Non-medical: Not on file  Tobacco Use  . Smoking status: Former Smoker    Types: Cigarettes    Last attempt to quit: 03/07/1988    Years since quitting: 30.4  . Smokeless tobacco: Former Engineer, water and Sexual Activity  . Alcohol use: Not Currently  . Drug use: Not Currently  . Sexual activity: Yes  Lifestyle  . Physical activity:    Days per week: Not on file    Minutes per session: Not on file  . Stress: Not on file  Relationships  . Social connections:    Talks on phone: Not on file    Gets together: Not on file    Attends religious service: Not on file    Active member of club or organization: Not on file    Attends meetings of clubs or organizations: Not on file    Relationship status: Not on file  . Intimate partner violence:    Fear of current or  ex partner: Not on file    Emotionally abused: Not on file    Physically abused: Not on file    Forced sexual activity: Not on file  Other Topics Concern  . Not on file  Social History Narrative  . Not on file    Past Surgical History:  Procedure Laterality Date  . EXCISION MASS HEAD  03/17/2018   excision cells on head  ; still in testing for cancer per patient report   . LUMBAR EPIDURAL INJECTION    . UMBILICAL HERNIA REPAIR N/A 04/02/2018   Procedure: OPEN UMBILICAL HERNIA REPAIR WITH INSERTION OF MESH;  Surgeon: Andria MeuseWhite, Christopher M, MD;  Location: [facility];  Service: General;  Laterality: N/A;  . WISDOM TOOTH EXTRACTION  1989-1990    Family History  Problem Relation Age of Onset  . Hypertension Mother   . Hyperlipidemia Father   . Healthy Sister   . Colon cancer Maternal Grandfather   . Prostate  cancer Maternal Grandfather   . Congestive Heart Failure Maternal Grandfather   . Dementia Paternal Grandmother   . Heart disease Paternal Grandfather   . Heart attack Paternal Grandfather   . Hypertension Maternal Uncle   . Hyperlipidemia Maternal Uncle   . Hepatitis Paternal Aunt   . Hepatitis C Paternal Aunt   . Hypertension Paternal Uncle   . Hyperlipidemia Paternal Uncle   . Healthy Son        x1  . Healthy Daughter        x4    Allergies  Allergen Reactions  . Co-Trimoxazole [Sulfamethoxazole-Trimethoprim] Nausea And Vomiting  . Lisinopril Cough    With Wheezing   . Sulfa Antibiotics Nausea And Vomiting    Current Outpatient Medications on File Prior to Visit  Medication Sig Dispense Refill  . acetaminophen (TYLENOL) 500 MG tablet Take 1,000 mg by mouth daily as needed for moderate pain or headache.    . albuterol (PROVENTIL HFA;VENTOLIN HFA) 108 (90 Base) MCG/ACT inhaler Inhale 1-2 puffs into the lungs every 6 (six) hours as needed for wheezing or shortness of breath. Ventolin    . atorvastatin (LIPITOR) 20 MG tablet TAKE 1 TABLET BY MOUTH EVERY DAY 90 tablet 0  . calcium carbonate (TUMS - DOSED IN MG ELEMENTAL CALCIUM) 500 MG chewable tablet Chew 2 tablets by mouth daily as needed for indigestion or heartburn.    . fenofibrate (TRICOR) 48 MG tablet TAKE 1 TABLET BY MOUTH EVERY DAY 90 tablet 1  . FLUoxetine (PROZAC) 40 MG capsule Take 2 capsules (80 mg total) by mouth daily. 180 capsule 1  . loratadine (CLARITIN) 10 MG tablet Take 10 mg by mouth daily as needed for allergies.    Marland Kitchen. LORazepam (ATIVAN) 0.5 MG tablet Take 1 tablet (0.5 mg total) by mouth 2 (two) times daily as needed for anxiety. 60 tablet 1  . Multiple Vitamin (MULTIVITAMIN WITH MINERALS) TABS tablet Take 1 tablet by mouth daily.    . QUEtiapine (SEROQUEL) 50 MG tablet Take 2 tablets (100 mg total) by mouth at bedtime. 180 tablet 1  . SUMAtriptan (IMITREX) 50 MG tablet Take 1 tablet (50 mg total) by mouth  every 2 (two) hours as needed for migraine. May repeat in 2 hours if headache persists or recurs. 10 tablet 1  . triamterene-hydrochlorothiazide (MAXZIDE-25) 37.5-25 MG tablet TAKE 1 TABLET BY MOUTH EVERY DAY 90 tablet 1  . zolpidem (AMBIEN) 10 MG tablet Take 1 tablet (10 mg total) by mouth at bedtime as needed for sleep.  30 tablet 1  . amoxicillin-clavulanate (AUGMENTIN) 875-125 MG tablet Take 1 tablet by mouth 2 (two) times daily. (Patient not taking: Reported on 08/28/2018) 20 tablet 0  . benzonatate (TESSALON) 100 MG capsule Take 1 capsule (100 mg total) by mouth 3 (three) times daily as needed for cough. (Patient not taking: Reported on 08/28/2018) 30 capsule 0  . ibuprofen (ADVIL,MOTRIN) 200 MG tablet Take 400 mg by mouth daily as needed for headache or moderate pain.     No current facility-administered medications on file prior to visit.     BP (!) 115/95 (BP Location: Left Arm, Patient Position: Sitting, Cuff Size: Normal)   Pulse 77   Temp 98.2 F (36.8 C) (Oral)   Resp 16   Ht 5\' 9"  (1.753 m)   Wt 211 lb 4 oz (95.8 kg)   SpO2 99%   BMI 31.20 kg/m       Objective:   Physical Exam  General  Mental Status - Alert. General Appearance - Well groomed. Not in acute distress.  Skin Rashes- No Rashes.  HEENT Head- Normal. Ear Auditory Canal - Left- Normal. Right - Normal.Tympanic Membrane- Left- Normal. Right- Normal. Eye Sclera/Conjunctiva- Left- Normal. Right- Normal. Nose & Sinuses Nasal Mucosa- Left-  Boggy and Congested. Right-  Boggy and  Congested.Bilateral maxillary but no  frontal sinus pressure. Mouth & Throat Lips: Upper Lip- Normal: no dryness, cracking, pallor, cyanosis, or vesicular eruption. Lower Lip-Normal: no dryness, cracking, pallor, cyanosis or vesicular eruption. Buccal Mucosa- Bilateral- No Aphthous ulcers. Oropharynx- No Discharge or Erythema. Tonsils: Characteristics- Bilateral- No Erythema or Congestion. Size/Enlargement- Bilateral- No  enlargement. Discharge- bilateral-None.  Neck Neck- Supple. No Masses.   Chest and Lung Exam Auscultation: Breath Sounds:-Clear even and unlabored.  Cardiovascular Auscultation:Rythm- Regular, rate and rhythm. Murmurs & Other Heart Sounds:Ausculatation of the heart reveal- No Murmurs.  Lymphatic Head & Neck General Head & Neck Lymphatics: Bilateral: Description- No Localized lymphadenopathy.       Assessment & Plan:   You appear to have bronchitis and sinusitis. Rest hydrate and tylenol for fever. I am prescribing cough medicine benzonatate, and doxycycline  antibiotic. For your nasal congestion rx flonase.  For wheezing, Will rx flovent inhaler but also rx 5 day taper dose of prednisone. Use albuterol if needed.  Get chest xray today.  Follow up in 7-10 days or as needed

## 2018-08-28 NOTE — Patient Instructions (Signed)
You appear to have bronchitis and sinusitis. Rest hydrate and tylenol for fever. I am prescribing cough medicine benzonatate, and doxycycline  antibiotic. For your nasal congestion rx flonase.  For wheezing, Will rx flovent inhaler but also rx 5 day taper dose of prednisone. Use albuterol if needed.  Get chest xray today.  Follow up in 7-10 days or as needed

## 2018-08-28 NOTE — Progress Notes (Signed)
Pre visit review using our clinic review tool, if applicable. No additional management support is needed unless otherwise documented below in the visit note. 

## 2018-08-29 ENCOUNTER — Other Ambulatory Visit: Payer: Self-pay | Admitting: Medical

## 2018-08-29 ENCOUNTER — Encounter: Payer: Self-pay | Admitting: Medical

## 2018-08-31 ENCOUNTER — Other Ambulatory Visit: Payer: Self-pay | Admitting: Medical

## 2018-09-08 ENCOUNTER — Ambulatory Visit (INDEPENDENT_AMBULATORY_CARE_PROVIDER_SITE_OTHER): Payer: BLUE CROSS/BLUE SHIELD

## 2018-09-08 DIAGNOSIS — Z23 Encounter for immunization: Secondary | ICD-10-CM | POA: Diagnosis not present

## 2018-09-18 ENCOUNTER — Ambulatory Visit: Payer: BLUE CROSS/BLUE SHIELD | Admitting: Medical

## 2018-09-22 ENCOUNTER — Encounter: Payer: Self-pay | Admitting: Medical

## 2018-09-25 ENCOUNTER — Other Ambulatory Visit: Payer: Self-pay | Admitting: Medical

## 2018-09-29 MED ORDER — SUMATRIPTAN SUCCINATE 50 MG PO TABS: ORAL_TABLET | ORAL | 2 refills | Status: DC

## 2018-09-30 ENCOUNTER — Other Ambulatory Visit: Payer: Self-pay | Admitting: Medical

## 2018-10-22 ENCOUNTER — Ambulatory Visit (INDEPENDENT_AMBULATORY_CARE_PROVIDER_SITE_OTHER): Payer: BLUE CROSS/BLUE SHIELD

## 2018-10-22 ENCOUNTER — Ambulatory Visit (INDEPENDENT_AMBULATORY_CARE_PROVIDER_SITE_OTHER): Payer: BLUE CROSS/BLUE SHIELD | Admitting: Orthopaedic Surgery

## 2018-10-22 ENCOUNTER — Encounter (INDEPENDENT_AMBULATORY_CARE_PROVIDER_SITE_OTHER): Payer: Self-pay | Admitting: Orthopaedic Surgery

## 2018-10-22 DIAGNOSIS — M25512 Pain in left shoulder: Secondary | ICD-10-CM

## 2018-10-22 DIAGNOSIS — M7532 Calcific tendinitis of left shoulder: Secondary | ICD-10-CM | POA: Diagnosis not present

## 2018-10-22 MED ORDER — LIDOCAINE HCL 1 % IJ SOLN
3.0000 mL | INTRAMUSCULAR | Status: AC | PRN
  Administered 2018-10-22: 3 mL

## 2018-10-22 MED ORDER — METHYLPREDNISOLONE ACETATE 40 MG/ML IJ SUSP
40.0000 mg | INTRAMUSCULAR | Status: AC | PRN
  Administered 2018-10-22: 40 mg via INTRA_ARTICULAR

## 2018-10-22 NOTE — Progress Notes (Signed)
Office Visit Note   Patient: George Ward           Date of Birth: September 28, 1970           MRN: [id number] Visit Date: 10/22/2018              Requested by: Esperanza Richters, PA-C [id number] [place], [place] [address] PCP: Esperanza Richters, PA-C   Assessment & Plan: Visit Diagnoses:  1. Left shoulder pain, unspecified chronicity   2. Calcific tendonitis of left shoulder     Plan: I went over his x-rays and clinical exam in detail with him.  I do feel he would benefit from a subacromial steroid injection to see how this will help with his symptoms.  He agrees with this as well and tolerated the injection well.  I will have him follow-up as needed but I would consider repeat injection in about 6 to 8 weeks if he is having issues still since he is not a diabetic.  However if his symptoms worsen before then he will call because I would absolutely obtain an MRI of his left shoulder to assess the rotator cuff.  All question concerns were answered and addressed.  Follow-Up Instructions: Return if symptoms worsen or fail to improve.   Orders:  Orders Placed This Encounter  Procedures  . Large Joint Inj: L subacromial bursa  . XR Shoulder Left   No orders of the defined types were placed in this encounter.     Procedures: Large Joint Inj: L subacromial bursa on 10/22/2018 4:55 PM Indications: pain and diagnostic evaluation Details: 22 G 1.5 in needle  Arthrogram: No  Medications: 3 mL lidocaine 1 %; 40 mg methylPREDNISolone acetate 40 MG/ML Outcome: tolerated well, no immediate complications Procedure, treatment alternatives, risks and benefits explained, specific risks discussed. Consent was given by the patient. Immediately prior to procedure a time out was called to verify the correct patient, procedure, equipment, support staff and site/side marked as required. Patient was prepped and draped in the usual sterile fashion.       Clinical Data: No additional  findings.   Subjective: Chief Complaint  Patient presents with  . Left Shoulder - Pain  Patient someone I am seeing for the first time.  Have actually seen his wife recently.  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  He has had some shoulder issues on his right side before but nothing on his left side.  He is recently started developed some left shoulder pain and it does wake him up at night.  Reaching up overhead and across the front and behind causes some pain in the shoulder.  He denies any radicular symptoms or any neck issues.  He does take Motrin to see if this will help.  He is not sure of any type of repetitive activities that is done to cause this or any injury that he is aware of.  He is never had shoulder issues before on the side.  He is not a diabetic and is very active individual.  He denies any recent illnesses.  HPI  Review of Systems He currently denies any headache, chest pain, shortness of breath, fever, chills, nausea, vomiting.  Objective: Vital Signs: There were no vitals taken for this visit.  Physical Exam He is alert and orient x3 and in no acute distress Ortho Exam Examination of his left shoulder shows full range  of motion.  The rotator cuff itself feels strong to me.  He does have positive Neer and Hawkins signs.  There is a lot of pain just off the lateral edge of the acromion at the insertion site of the rotator cuff. Specialty Comments:  No specialty comments available.  Imaging: Xr Shoulder Left  Result Date: 10/22/2018 3 views of his left shoulder show well located shoulder.  There is no significant disease of the glenohumeral joint or the [facility].  There is calcifications at the insertion of the rotator cuff suggesting calcific tendinitis.    PMFS History: Patient Active Problem List   Diagnosis Date Noted  . 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 MDD (major depressive disorder)  03/06/2017   Past Medical History:  Diagnosis Date  . Alcohol abuse    Remission >20 years ago  . Allergy   . Anxiety   . Asthma   . Depression    MAJOR DEPRESSIVE DISORDER  . Depression with anxiety   . Drug abuse (HCC)    Remission >20 years ago  . Encounter for HIV (human immunodeficiency virus) test 2014-2015  . Environmental allergies   . History of chicken pox   . Hyperlipidemia   . Hypertension   . Hypoalphalipoproteinemia, familial   . Lumbar pain   . Migraines   . Primary generalized (osteo)arthritis   . Seasonal allergies     Family History  Problem Relation Age of Onset  . Hypertension Mother   . Hyperlipidemia Father   . Healthy Sister   . Colon cancer Maternal Grandfather   . Prostate cancer Maternal Grandfather   . Congestive Heart Failure Maternal Grandfather   . Dementia Paternal Grandmother   . Heart disease Paternal Grandfather   . Heart attack Paternal Grandfather   . Hypertension Maternal Uncle   . Hyperlipidemia Maternal Uncle   . Hepatitis Paternal Aunt   . Hepatitis C Paternal Aunt   . Hypertension Paternal Uncle   . Hyperlipidemia Paternal Uncle   . Healthy Son        x1  . Healthy Daughter        x4    Past Surgical History:  Procedure Laterality Date  . EXCISION MASS HEAD  03/17/2018   excision cells on head  ; still in testing for cancer per patient report   . LUMBAR EPIDURAL INJECTION    . UMBILICAL HERNIA REPAIR N/A 04/02/2018   Procedure: OPEN UMBILICAL HERNIA REPAIR WITH INSERTION OF MESH;  Surgeon: Andria MeuseWhite, Lysha Schrade M, MD;  Location: [facility];  Service: General;  Laterality: N/A;  . WISDOM TOOTH EXTRACTION  1989-1990   Social History   Occupational History  . Not on file  Tobacco Use  . Smoking status: Former Smoker    Types: Cigarettes    Last attempt to quit: 03/07/1988    Years since quitting: 30.6  . Smokeless tobacco: Former Engineer, waterUser  Substance and Sexual Activity  . Alcohol use: Not Currently  . Drug use: Not Currently   . Sexual activity: Yes

## 2019-01-03 ENCOUNTER — Other Ambulatory Visit: Payer: Self-pay | Admitting: Medical

## 2019-01-05 ENCOUNTER — Encounter: Payer: Self-pay | Admitting: Medical

## 2019-01-05 ENCOUNTER — Other Ambulatory Visit: Payer: Self-pay

## 2019-01-05 ENCOUNTER — Ambulatory Visit (INDEPENDENT_AMBULATORY_CARE_PROVIDER_SITE_OTHER): Payer: BLUE CROSS/BLUE SHIELD | Admitting: Medical

## 2019-01-05 ENCOUNTER — Other Ambulatory Visit: Payer: Self-pay | Admitting: Medical

## 2019-01-05 ENCOUNTER — Ambulatory Visit: Payer: Self-pay | Admitting: Medical

## 2019-01-05 VITALS — BP 127/90

## 2019-01-05 DIAGNOSIS — I10 Essential (primary) hypertension: Secondary | ICD-10-CM

## 2019-01-05 DIAGNOSIS — F32A Depression, unspecified: Secondary | ICD-10-CM

## 2019-01-05 DIAGNOSIS — F329 Major depressive disorder, single episode, unspecified: Secondary | ICD-10-CM | POA: Diagnosis not present

## 2019-01-05 DIAGNOSIS — E785 Hyperlipidemia, unspecified: Secondary | ICD-10-CM

## 2019-01-05 MED ORDER — ATORVASTATIN CALCIUM 20 MG PO TABS: 20.0000 mg | ORAL_TABLET | Freq: Every day | ORAL | 0 refills | Status: DC

## 2019-01-05 MED ORDER — FENOFIBRATE 48 MG PO TABS: 48.0000 mg | ORAL_TABLET | Freq: Every day | ORAL | 1 refills | Status: DC

## 2019-01-05 NOTE — Patient Instructions (Addendum)
For high cholesterol, will refill atorvastatin today. He will continue fenofibrate. Put in future lipid panel and cmp to be done by end of June. But will do sooner if his insurance allows virtual visit. Will need to check with staff.  For htn, continue maxzide. Asked pt to check bp today at pharmacy and he will send me message on reading by my chart.  Mood is stable and seeing Dr. Gaspar Skeeters. Continue current psychiatric medications.  Follow up date to be determined.

## 2019-01-05 NOTE — Progress Notes (Signed)
   Subjective:    Patient ID: George Ward, male    DOB: [birth year], 48 y.o.   MRN: [id number]  HPI  Virtual Visit via Video Note  I connected with George Ward on 01/05/19 at 11:35 AM EDT by a video enabled telemedicine application and verified that I am speaking with the correct person using two identifiers.   I discussed the limitations of evaluation and management by telemedicine and the availability of in person appointments. The patient expressed understanding and agreed to proceed.  Pt did not check bp today. He is going to pharmacy and will check there and give [place] call back.  History of Present Illness:  Pt in states that he is tired. 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  He is breathing well. No allergy flares since he staying inside. He does not do a lot of exercising.   Doing some treadmill work in garage.  Pt states his depression and anxiety. Pt is on prozac and ativan same regimen. Doing well with that regimen.  Pt has high cholesterol. He is on atorvastatin and fenofibrate. He states eating more healthy.     Observations/Objective: No acute distress  Assessment and Plan: For high cholesterol, will refill atorvastatin today. He will continue fenofibrate. Put in future lipid panel and cmp to be done by end of June. But will do sooner if his insurance allows virtual visit. Will need to check with staff.  For htn, continue maxzide. Asked pt to check bp today at pharmacy and he will send me message on reading by my chart.  Mood is stable and seeing Dr. Gaspar Skeeters. Continue current psychiatric medications.  Follow up date to be determined.  Follow Up Instructions:    I discussed the assessment and treatment plan with the patient. The patient was provided an opportunity to ask questions and all were answered. The patient agreed with the plan and demonstrated an understanding of the instructions.   The patient was advised to call back  or seek an in-person evaluation if the symptoms worsen or if the condition fails to improve as anticipated.    Esperanza Richters, PA-C   Review of Systems     Objective:   Physical Exam        Assessment & Plan:

## 2019-01-06 ENCOUNTER — Ambulatory Visit: Payer: Self-pay | Admitting: Medical

## 2019-02-05 DIAGNOSIS — F332 Major depressive disorder, recurrent severe without psychotic features: Secondary | ICD-10-CM | POA: Diagnosis not present

## 2019-02-12 DIAGNOSIS — M9902 Segmental and somatic dysfunction of thoracic region: Secondary | ICD-10-CM | POA: Diagnosis not present

## 2019-02-12 DIAGNOSIS — M542 Cervicalgia: Secondary | ICD-10-CM | POA: Diagnosis not present

## 2019-02-12 DIAGNOSIS — M9903 Segmental and somatic dysfunction of lumbar region: Secondary | ICD-10-CM | POA: Diagnosis not present

## 2019-02-12 DIAGNOSIS — M9901 Segmental and somatic dysfunction of cervical region: Secondary | ICD-10-CM | POA: Diagnosis not present

## 2019-02-17 DIAGNOSIS — M9903 Segmental and somatic dysfunction of lumbar region: Secondary | ICD-10-CM | POA: Diagnosis not present

## 2019-02-17 DIAGNOSIS — M542 Cervicalgia: Secondary | ICD-10-CM | POA: Diagnosis not present

## 2019-02-17 DIAGNOSIS — M9901 Segmental and somatic dysfunction of cervical region: Secondary | ICD-10-CM | POA: Diagnosis not present

## 2019-02-17 DIAGNOSIS — M9902 Segmental and somatic dysfunction of thoracic region: Secondary | ICD-10-CM | POA: Diagnosis not present

## 2019-02-19 DIAGNOSIS — M9901 Segmental and somatic dysfunction of cervical region: Secondary | ICD-10-CM | POA: Diagnosis not present

## 2019-02-19 DIAGNOSIS — M542 Cervicalgia: Secondary | ICD-10-CM | POA: Diagnosis not present

## 2019-02-19 DIAGNOSIS — M9902 Segmental and somatic dysfunction of thoracic region: Secondary | ICD-10-CM | POA: Diagnosis not present

## 2019-02-19 DIAGNOSIS — M9903 Segmental and somatic dysfunction of lumbar region: Secondary | ICD-10-CM | POA: Diagnosis not present

## 2019-02-26 DIAGNOSIS — M542 Cervicalgia: Secondary | ICD-10-CM | POA: Diagnosis not present

## 2019-02-26 DIAGNOSIS — M9903 Segmental and somatic dysfunction of lumbar region: Secondary | ICD-10-CM | POA: Diagnosis not present

## 2019-02-26 DIAGNOSIS — M9902 Segmental and somatic dysfunction of thoracic region: Secondary | ICD-10-CM | POA: Diagnosis not present

## 2019-02-26 DIAGNOSIS — M9901 Segmental and somatic dysfunction of cervical region: Secondary | ICD-10-CM | POA: Diagnosis not present

## 2019-03-05 DIAGNOSIS — M9903 Segmental and somatic dysfunction of lumbar region: Secondary | ICD-10-CM | POA: Diagnosis not present

## 2019-03-05 DIAGNOSIS — M9902 Segmental and somatic dysfunction of thoracic region: Secondary | ICD-10-CM | POA: Diagnosis not present

## 2019-03-05 DIAGNOSIS — M9901 Segmental and somatic dysfunction of cervical region: Secondary | ICD-10-CM | POA: Diagnosis not present

## 2019-03-05 DIAGNOSIS — M542 Cervicalgia: Secondary | ICD-10-CM | POA: Diagnosis not present

## 2019-03-18 ENCOUNTER — Other Ambulatory Visit: Payer: Self-pay | Admitting: Medical

## 2019-04-14 ENCOUNTER — Encounter: Payer: Self-pay | Admitting: Medical

## 2019-04-14 MED ORDER — ATORVASTATIN CALCIUM 20 MG PO TABS: 20.0000 mg | ORAL_TABLET | Freq: Every day | ORAL | 1 refills | Status: DC

## 2019-04-20 DIAGNOSIS — F332 Major depressive disorder, recurrent severe without psychotic features: Secondary | ICD-10-CM | POA: Diagnosis not present

## 2019-04-21 ENCOUNTER — Encounter: Payer: Self-pay | Admitting: Medical

## 2019-05-17 ENCOUNTER — Other Ambulatory Visit: Payer: Self-pay | Admitting: Medical

## 2019-06-03 ENCOUNTER — Other Ambulatory Visit: Payer: Self-pay

## 2019-06-03 DIAGNOSIS — R6889 Other general symptoms and signs: Secondary | ICD-10-CM | POA: Diagnosis not present

## 2019-06-03 DIAGNOSIS — Z20822 Contact with and (suspected) exposure to covid-19: Secondary | ICD-10-CM

## 2019-06-05 LAB — NOVEL CORONAVIRUS, NAA: SARS-CoV-2, NAA: NOT DETECTED

## 2019-06-09 ENCOUNTER — Other Ambulatory Visit: Payer: BLUE CROSS/BLUE SHIELD

## 2019-06-17 ENCOUNTER — Other Ambulatory Visit (INDEPENDENT_AMBULATORY_CARE_PROVIDER_SITE_OTHER): Payer: BC Managed Care – PPO

## 2019-06-17 ENCOUNTER — Other Ambulatory Visit: Payer: Self-pay

## 2019-06-17 DIAGNOSIS — E785 Hyperlipidemia, unspecified: Secondary | ICD-10-CM | POA: Diagnosis not present

## 2019-06-17 LAB — COMPREHENSIVE METABOLIC PANEL
ALT: 28 U/L (ref 0–53)
AST: 23 U/L (ref 0–37)
Albumin: 4.5 g/dL (ref 3.5–5.2)
Alkaline Phosphatase: 59 U/L (ref 39–117)
BUN: 13 mg/dL (ref 6–23)
CO2: 29 mEq/L (ref 19–32)
Calcium: 9.7 mg/dL (ref 8.4–10.5)
Chloride: 104 mEq/L (ref 96–112)
Creatinine, Ser: 1.17 mg/dL (ref 0.40–1.50)
GFR: 66.27 mL/min (ref >60.00)
Glucose, Bld: 96 mg/dL (ref 70–99)
Potassium: 3.7 mEq/L (ref 3.5–5.1)
Sodium: 142 mEq/L (ref 135–145)
Total Bilirubin: 0.5 mg/dL (ref 0.2–1.2)
Total Protein: 6.9 g/dL (ref 6.0–8.3)

## 2019-06-17 LAB — LIPID PANEL
Cholesterol: 188 mg/dL (ref 0–200)
HDL: 33.7 mg/dL — ABNORMAL LOW (ref >39.00)
NonHDL: 154.24
Total CHOL/HDL Ratio: 6
Triglycerides: 389 mg/dL — ABNORMAL HIGH (ref 0.0–149.0)
VLDL: 77.8 mg/dL — ABNORMAL HIGH (ref 0.0–40.0)

## 2019-06-17 LAB — LDL CHOLESTEROL, DIRECT: Direct LDL: 105 mg/dL

## 2019-06-20 ENCOUNTER — Encounter: Payer: Self-pay | Admitting: Medical

## 2019-06-22 ENCOUNTER — Telehealth: Payer: Self-pay | Admitting: Medical

## 2019-06-22 ENCOUNTER — Encounter: Payer: Self-pay | Admitting: Medical

## 2019-06-22 MED ORDER — ATORVASTATIN CALCIUM 40 MG PO TABS: 40.0000 mg | ORAL_TABLET | Freq: Every day | ORAL | 1 refills | Status: DC

## 2019-06-22 NOTE — Telephone Encounter (Signed)
Rx lipitor sent to pt pharmacy. 

## 2019-06-24 ENCOUNTER — Other Ambulatory Visit: Payer: Self-pay

## 2019-06-24 ENCOUNTER — Ambulatory Visit (INDEPENDENT_AMBULATORY_CARE_PROVIDER_SITE_OTHER): Payer: BC Managed Care – PPO | Admitting: Medical

## 2019-06-24 ENCOUNTER — Encounter: Payer: Self-pay | Admitting: Medical

## 2019-06-24 VITALS — BP 130/85 | HR 87 | Temp 96.6°F | Resp 16 | Ht 69.0 in | Wt 207.4 lb

## 2019-06-24 DIAGNOSIS — Z0000 Encounter for general adult medical examination without abnormal findings: Secondary | ICD-10-CM

## 2019-06-24 DIAGNOSIS — Z23 Encounter for immunization: Secondary | ICD-10-CM | POA: Diagnosis not present

## 2019-06-24 NOTE — Progress Notes (Signed)
Subjective:    Patient ID: George Ward, male    DOB: 1971/08/17, 48 y.o.   MRN: [id number]  HPI  Patient here for annual physical.(Recent labs done before cpe) Reviewed/notified pt by my chart) Filled out cpe form today and gave to pt.   No acute complaints or concerns.   Son was positive for COVID about 2-3 weeks ago, but family has been asymptomatic and they have all tested negative.  Former smoker. Rare alcohol use. No recreational drugs.  Patient reports moderately healthy diet. His exercise has been decreased recently due to [place] and school being moved online. He teaches at [employer].  Last migraine was yesterday. Took Imitrex which took care of it quickly. It had been several weeks since last migraine.  Labs last week with elevated triglycerides. Atorvastatin was increased, and fenofibrate continued.  Chronic left shoulder pain which he has had steroid injections for in the past. Not interested in any referral at this time. States he is managing alright.   Would like flu and tetanus vaccine.    Review of Systems  Constitutional: Negative for chills, fatigue and fever.  HENT: Negative for congestion, sinus pressure and sore throat.   Respiratory: Negative for chest tightness and shortness of breath.   Cardiovascular: Negative for chest pain and palpitations.  Gastrointestinal: Negative for abdominal pain, blood in stool, constipation and diarrhea.  Genitourinary: Negative for dysuria, flank pain and hematuria.  Musculoskeletal: Negative for arthralgias and myalgias.       Chronic left shoulder pain. He has had cortisone injections in the past. States he has been managing alright.   Skin: Negative for rash.  Neurological: Negative for dizziness, weakness and light-headedness.  Psychiatric/Behavioral: Negative for dysphoric mood and suicidal ideas. The patient is not nervous/anxious.     Past Medical History:  Diagnosis Date  . Alcohol abuse    Remission >20 years ago  . Allergy   . Anxiety   . Asthma   . Depression    MAJOR DEPRESSIVE DISORDER  . Depression with anxiety   . Drug abuse ([place])    Remission >20 years ago  . Encounter for HIV (human immunodeficiency virus) test 2014-2015  . Environmental allergies   . History of chicken pox   . Hyperlipidemia   . Hypertension   . Hypoalphalipoproteinemia, familial   . Lumbar pain   . Migraines   . Primary generalized (osteo)arthritis   . Seasonal allergies      Social History   Socioeconomic History  . Marital status: Married    Spouse name: Not on file  . Number of children: Not on file  . Years of education: Not on file  . Highest education level: Not on file  Occupational History  . Not on file  Social Needs  . Financial resource strain: Not on file  . Food insecurity    Worry: Not on file    Inability: Not on file  . Transportation needs    Medical: Not on file    Non-medical: Not on file  Tobacco Use  . Smoking status: Former Smoker    Types: Cigarettes    Quit date: 03/07/1988    Years since quitting: 31.3  . Smokeless tobacco: Former Network engineer and Sexual Activity  . Alcohol use: Not Currently  . Drug use: Not Currently  . Sexual activity: Yes  Lifestyle  . Physical activity    Days per week: Not on file    Minutes per session: Not  on file  . Stress: Not on file  Relationships  . Social Musician on phone: Not on file    Gets together: Not on file    Attends religious service: Not on file    Active member of club or organization: Not on file    Attends meetings of clubs or organizations: Not on file    Relationship status: Not on file  . Intimate partner violence    Fear of current or ex partner: Not on file    Emotionally abused: Not on file    Physically abused: Not on file    Forced sexual activity: Not on file  Other Topics Concern  . Not on file  Social History Narrative  . Not on file    Past Surgical History:   Procedure Laterality Date  . EXCISION MASS HEAD  03/17/2018   excision cells on head  ; still in testing for cancer per patient report   . LUMBAR EPIDURAL INJECTION    . UMBILICAL HERNIA REPAIR N/A 04/02/2018   Procedure: OPEN UMBILICAL HERNIA REPAIR WITH INSERTION OF MESH;  Surgeon: Andria Meuse, MD;  Location: [facility];  Service: General;  Laterality: N/A;  . WISDOM TOOTH EXTRACTION  1989-1990    Family History  Problem Relation Age of Onset  . Hypertension Mother   . Hyperlipidemia Father   . Healthy Sister   . Colon cancer Maternal Grandfather   . Prostate cancer Maternal Grandfather   . Congestive Heart Failure Maternal Grandfather   . Dementia Paternal Grandmother   . Heart disease Paternal Grandfather   . Heart attack Paternal Grandfather   . Hypertension Maternal Uncle   . Hyperlipidemia Maternal Uncle   . Hepatitis Paternal Aunt   . Hepatitis C Paternal Aunt   . Hypertension Paternal Uncle   . Hyperlipidemia Paternal Uncle   . Healthy Son        x1  . Healthy Daughter        x4    Allergies  Allergen Reactions  . Co-Trimoxazole [Sulfamethoxazole-Trimethoprim] Nausea And Vomiting  . Lisinopril Cough    With Wheezing   . Sulfa Antibiotics Nausea And Vomiting    Current Outpatient Medications on File Prior to Visit  Medication Sig Dispense Refill  . acetaminophen (TYLENOL) 500 MG tablet Take 1,000 mg by mouth daily as needed for moderate pain or headache.    . albuterol (PROVENTIL HFA;VENTOLIN HFA) 108 (90 Base) MCG/ACT inhaler Inhale 1-2 puffs into the lungs every 6 (six) hours as needed for wheezing or shortness of breath. Ventolin    . atorvastatin (LIPITOR) 40 MG tablet Take 1 tablet (40 mg total) by mouth daily. 90 tablet 1  . calcium carbonate (TUMS - DOSED IN MG ELEMENTAL CALCIUM) 500 MG chewable tablet Chew 2 tablets by mouth daily as needed for indigestion or heartburn.    . fenofibrate (TRICOR) 48 MG tablet Take 1 tablet (48 mg total) by mouth  daily. 90 tablet 1  . ibuprofen (ADVIL,MOTRIN) 200 MG tablet Take 400 mg by mouth daily as needed for headache or moderate pain.    Marland Kitchen loratadine (CLARITIN) 10 MG tablet Take 10 mg by mouth daily as needed for allergies.    Marland Kitchen LORazepam (ATIVAN) 0.5 MG tablet Take 1 tablet (0.5 mg total) by mouth 2 (two) times daily as needed for anxiety. 60 tablet 1  . Multiple Vitamin (MULTIVITAMIN WITH MINERALS) TABS tablet Take 1 tablet by mouth daily.    . SUMAtriptan (  IMITREX) 50 MG tablet TAKE 1 TABLET BY MOUTH AS NEEDED FOR MIGRAINE, MAY REPEAT IN 2 HOURS IF HEADACHE PERSISTS/RECURS 9 tablet 2  . triamterene-hydrochlorothiazide (MAXZIDE-25) 37.5-25 MG tablet TAKE 1 TABLET BY MOUTH EVERY DAY 90 tablet 1  . FLUoxetine (PROZAC) 40 MG capsule Take 2 capsules (80 mg total) by mouth daily. 180 capsule 1  . QUEtiapine (SEROQUEL) 50 MG tablet Take 2 tablets (100 mg total) by mouth at bedtime. 180 tablet 1  . zolpidem (AMBIEN) 10 MG tablet Take 1 tablet (10 mg total) by mouth at bedtime as needed for sleep. 30 tablet 1   No current facility-administered medications on file prior to visit.     BP (!) 134/95   Pulse 87   Temp (!) 96.6 F (35.9 C) (Temporal)   Resp 16   Ht 5\' 9"  (1.753 m)   Wt 207 lb 6.4 oz (94.1 kg)   SpO2 100%   BMI 30.63 kg/m       Objective:   Physical Exam  General Mental Status- Alert. General Appearance- Not in acute distress.   Skin General: Color- Normal Color. Moisture- Normal Moisture.  Neck Carotid Arteries- Normal color. Moisture- Normal Moisture. No carotid bruits. No JVD.  Chest and Lung Exam Auscultation: Breath Sounds:-Normal.  Cardiovascular Auscultation:Rythm- Regular. Murmurs & Other Heart Sounds:Auscultation of the heart reveals- No Murmurs.  Abdomen Inspection:-Inspeection Normal. Palpation/Percussion:Note:No mass. Palpation and Percussion of the abdomen reveal- Non Tender, Non Distended + BS, no rebound or guarding.  Neurologic Cranial Nerve exam:-  CN III-XII intact(No nystagmus), symmetric smile. Strength:- 5/5 equal and symmetric strength both upper and lower extremities.  Left shoulder- some pain on palpation anterior aspect. Reduced range of motion on abduction.      Assessment & Plan:  You had normal exam today.  Flu vaccine and tdap today.  Recommend exercise and healthy diet.  We will let you know lab results as they come in.  Follow up date appointment will be determined after lab review.   If shoulder pain becomes more bothersome then let me know can refer to specialist.(Dr Magnus IvanBlackman)  Migraines ha controlled. If ha worsen or change let us now.  NP student Hyman Hopesaylor Beck interviewed pt first today. Then I interviewed and modified note accordingly. Tx decisions made by myself.  Esperanza RichtersEdward Samra Pesch, PA-C

## 2019-06-24 NOTE — Patient Instructions (Addendum)
You had normal exam today.  Flu vaccine and tdap today.  Recommend exercise and healthy diet.  We will let you know lab results as they come in.  Follow up date appointment will be determined after lab review.   If shoulder pain becomes more bothersome then let me know can refer to specialist.(Dr Ninfa Linden)  Migraines ha controlled. If ha worsen or change let us now.  Preventive Care 80-49 Years Old, Male Preventive care refers to lifestyle choices and visits with your health care provider that can promote health and wellness. This includes:  A yearly physical exam. This is also called an annual well check.  Regular dental and eye exams.  Immunizations.  Screening for certain conditions.  Healthy lifestyle choices, such as eating a healthy diet, getting regular exercise, not using drugs or products that contain nicotine and tobacco, and limiting alcohol use. What can I expect for my preventive care visit? Physical exam Your health care provider will check:  Height and weight. These may be used to calculate body mass index (BMI), which is a measurement that tells if you are at a healthy weight.  Heart rate and blood pressure.  Your skin for abnormal spots. Counseling Your health care provider may ask you questions about:  Alcohol, tobacco, and drug use.  Emotional well-being.  Home and relationship well-being.  Sexual activity.  Eating habits.  Work and work Statistician. What immunizations do I need?  Influenza (flu) vaccine  This is recommended every year. Tetanus, diphtheria, and pertussis (Tdap) vaccine  You may need a Td booster every 10 years. Varicella (chickenpox) vaccine  You may need this vaccine if you have not already been vaccinated. Zoster (shingles) vaccine  You may need this after age 49. Measles, mumps, and rubella (MMR) vaccine  You may need at least one dose of MMR if you were born in 1957 or later. You may also need a second  dose. Pneumococcal conjugate (PCV13) vaccine  You may need this if you have certain conditions and were not previously vaccinated. Pneumococcal polysaccharide (PPSV23) vaccine  You may need one or two doses if you smoke cigarettes or if you have certain conditions. Meningococcal conjugate (MenACWY) vaccine  You may need this if you have certain conditions. Hepatitis A vaccine  You may need this if you have certain conditions or if you travel or work in places where you may be exposed to hepatitis A. Hepatitis B vaccine  You may need this if you have certain conditions or if you travel or work in places where you may be exposed to hepatitis B. Haemophilus influenzae type b (Hib) vaccine  You may need this if you have certain risk factors. Human papillomavirus (HPV) vaccine  If recommended by your health care provider, you may need three doses over 6 months. You may receive vaccines as individual doses or as more than one vaccine together in one shot (combination vaccines). Talk with your health care provider about the risks and benefits of combination vaccines. What tests do I need? Blood tests  Lipid and cholesterol levels. These may be checked every 5 years, or more frequently if you are over 49 years old.  Hepatitis C test.  Hepatitis B test. Screening  Lung cancer screening. You may have this screening every year starting at age 49 if you have a 30-pack-year history of smoking and currently smoke or have quit within the past 15 years.  Prostate cancer screening. Recommendations will vary depending on your family history and other  risks.  Colorectal cancer screening. All adults should have this screening starting at age 49 and continuing until age 40. Your health care provider may recommend screening at age 49 if you are at increased risk. You will have tests every 1-10 years, depending on your results and the type of screening test.  Diabetes screening. This is done by  checking your blood sugar (glucose) after you have not eaten for a while (fasting). You may have this done every 1-3 years.  Sexually transmitted disease (STD) testing. Follow these instructions at home: Eating and drinking  Eat a diet that includes fresh fruits and vegetables, whole grains, lean protein, and low-fat dairy products.  Take vitamin and mineral supplements as recommended by your health care provider.  Do not drink alcohol if your health care provider tells you not to drink.  If you drink alcohol: ? Limit how much you have to 0-2 drinks a day. ? Be aware of how much alcohol is in your drink. In the [place] one drink equals one 12 oz bottle of beer (355 mL), one 5 oz glass of wine (148 mL), or one 1 oz glass of hard liquor (44 mL). Lifestyle  Take daily care of your teeth and gums.  Stay active. Exercise for at least 30 minutes on 5 or more days each week.  Do not use any products that contain nicotine or tobacco, such as cigarettes, e-cigarettes, and chewing tobacco. If you need help quitting, ask your health care provider.  If you are sexually active, practice safe sex. Use a condom or other form of protection to prevent STIs (sexually transmitted infections).  Talk with your health care provider about taking a low-dose aspirin every day starting at age 49. What's next?  Go to your health care provider once a year for a well check visit.  Ask your health care provider how often you should have your eyes and teeth checked.  Stay up to date on all vaccines. This information is not intended to replace advice given to you by your health care provider. Make sure you discuss any questions you have with your health care provider. Document Released: 09/23/2015 Document Revised: 08/21/2018 Document Reviewed: 08/21/2018 Elsevier Patient Education  2020 [employer].

## 2019-07-03 ENCOUNTER — Other Ambulatory Visit: Payer: Self-pay | Admitting: Medical

## 2019-07-16 DIAGNOSIS — F332 Major depressive disorder, recurrent severe without psychotic features: Secondary | ICD-10-CM | POA: Diagnosis not present

## 2019-07-27 ENCOUNTER — Ambulatory Visit: Payer: BC Managed Care – PPO | Admitting: Orthopaedic Surgery

## 2019-07-29 ENCOUNTER — Other Ambulatory Visit: Payer: Self-pay

## 2019-07-29 ENCOUNTER — Encounter: Payer: Self-pay | Admitting: Orthopaedic Surgery

## 2019-07-29 ENCOUNTER — Ambulatory Visit: Payer: BC Managed Care – PPO | Admitting: Orthopaedic Surgery

## 2019-07-29 VITALS — Ht 70.0 in | Wt 200.0 lb

## 2019-07-29 DIAGNOSIS — G8929 Other chronic pain: Secondary | ICD-10-CM | POA: Diagnosis not present

## 2019-07-29 DIAGNOSIS — M25512 Pain in left shoulder: Secondary | ICD-10-CM

## 2019-07-29 DIAGNOSIS — M7532 Calcific tendinitis of left shoulder: Secondary | ICD-10-CM | POA: Diagnosis not present

## 2019-07-29 NOTE — Progress Notes (Signed)
The patient is well-known to me.  He has been dealing with chronic left shoulder issues for a long period time.  He used to be an overhead throwing athlete as well.  We tried a steroid injection in the subacromial outlet.  At these his symptoms somewhat.  He still hurting significantly with overhead activities as well as reaching behind him.  He has been working on a home exercise program as well as working on his range of motion but he is not improving.  X-rays were concerning for calcifications I can see just off of the insertion of the rotator cuff at the greater tuberosity.  This could be calcific tendinitis but also could be a partial to full rotator cuff tear.  On exam he still shows significant pain with his shoulder.  His liftoff is negative but very painful.  His internal rotation with adduction is only to lower lumbar spine.  He hurts significantly with shoulder abduction past 90 degrees.  He is using some of his deltoids to abduct his shoulder joint the pain that he is having.  He also has positive Neer and Hawkins signs.  Given the failure of conservative treatment which includes steroid injections, activity modification, rest, NSAIDs and an exercise program, a MRI of his left shoulder is warranted at this point to rule out a rotator cuff tear and to assess for any other pathology that may be causing him the significant pain.  All question concerns were answered and addressed.  I do feel this is medically necessary at this point as does he given the amount of pain he is having and no improvements given the conservative treatment efforts that have been made.  We will work on ordering this MRI and we will see him in follow-up.

## 2019-07-30 ENCOUNTER — Other Ambulatory Visit: Payer: Self-pay

## 2019-07-30 DIAGNOSIS — G8929 Other chronic pain: Secondary | ICD-10-CM

## 2019-08-01 ENCOUNTER — Other Ambulatory Visit: Payer: Self-pay

## 2019-08-01 ENCOUNTER — Ambulatory Visit (HOSPITAL_BASED_OUTPATIENT_CLINIC_OR_DEPARTMENT_OTHER)
Admission: RE | Admit: 2019-08-01 | Discharge: 2019-08-01 | Disposition: A | Discharge status: Home / Self Care | Payer: BC Managed Care – PPO | Source: Ambulatory Visit | Attending: Orthopaedic Surgery | Admitting: Orthopaedic Surgery

## 2019-08-01 DIAGNOSIS — G8929 Other chronic pain: Secondary | ICD-10-CM | POA: Diagnosis not present

## 2019-08-01 DIAGNOSIS — S46012A Strain of muscle(s) and tendon(s) of the rotator cuff of left shoulder, initial encounter: Secondary | ICD-10-CM | POA: Diagnosis not present

## 2019-08-01 DIAGNOSIS — M25512 Pain in left shoulder: Secondary | ICD-10-CM | POA: Insufficient documentation

## 2019-08-10 ENCOUNTER — Other Ambulatory Visit: Payer: Self-pay

## 2019-08-10 ENCOUNTER — Encounter: Payer: Self-pay | Admitting: Orthopaedic Surgery

## 2019-08-10 ENCOUNTER — Ambulatory Visit: Payer: BC Managed Care – PPO | Admitting: Physician Assistant

## 2019-08-10 DIAGNOSIS — M25512 Pain in left shoulder: Secondary | ICD-10-CM | POA: Diagnosis not present

## 2019-08-10 DIAGNOSIS — G8929 Other chronic pain: Secondary | ICD-10-CM

## 2019-08-10 MED ORDER — METHYLPREDNISOLONE ACETATE 40 MG/ML IJ SUSP
40.0000 mg | INTRAMUSCULAR | Status: AC | PRN
  Administered 2019-08-10: 17:00:00 40 mg via INTRA_ARTICULAR

## 2019-08-10 MED ORDER — LIDOCAINE HCL 1 % IJ SOLN
3.0000 mL | INTRAMUSCULAR | Status: AC | PRN
  Administered 2019-08-10: 17:00:00 3 mL

## 2019-08-10 NOTE — Progress Notes (Signed)
Office Visit Note   Patient: George Ward           Date of Birth: 03-07-1971           MRN: [id number] Visit Date: 08/10/2019              Requested by: Mackie Pai, PA-C [address],  [place] [id number] PCP: Mackie Pai, PA-C   Assessment & Plan: Visit Diagnoses:  1. Chronic left shoulder pain     Plan:  We will send him to formal physical therapy for range of motion, strengthening, home exercise program and modalities to the left shoulder.  See him back in about 2 weeks see what type of response he had to physical therapy and also the second subacromial injection.  He shown wall crawls and forward flexion exercises which he can perform until he can get to therapy.  Questions encouraged and answered. If his pain persist despite these conservative measures given the MRI findings would recommend left shoulder arthroscopy with extensive debridement.  Follow-Up Instructions: Return in about 2 weeks (around 08/24/2019).   Orders:  Orders Placed This Encounter  Procedures  . Large Joint Inj   No orders of the defined types were placed in this encounter.     Procedures: Large Joint Inj: L subacromial bursa on 08/10/2019 5:07 PM Indications: pain Details: 22 G 1.5 in needle, superior approach  Arthrogram: No  Medications: 3 mL lidocaine 1 %; 40 mg methylPREDNISolone acetate 40 MG/ML Outcome: tolerated well, no immediate complications Procedure, treatment alternatives, risks and benefits explained, specific risks discussed. Consent was given by the patient. Immediately prior to procedure a time out was called to verify the correct patient, procedure, equipment, support staff and site/side marked as required. Patient was prepped and draped in the usual sterile fashion.       Clinical Data: No additional findings.   Subjective: Chief Complaint  Patient presents with  . Left Shoulder - Follow-up    HPI George Ward returns today to go over  the MRI of his left shoulder.  He continues to have left shoulder pain which he describes as annoying. MRI images reviewed with him.  The MRI was performed on 10/03/2018 of his left shoulder.  This showed an AP low-grade articular surface tear involving supraspinatus tendon but no full-thickness cuff tear.  Subacromial subdeltoid bursal fluid consistent with bursitis was also seen.  Mild arthropathy of the [facility] joint.  Glenohumeral joint well-maintained. Review of Systems See HPI otherwise negative  Objective: Vital Signs: There were no vitals taken for this visit.  Physical Exam Constitutional:      Appearance: He is not ill-appearing or diaphoretic.  Pulmonary:     Effort: Pulmonary effort is normal.  Neurological:     Mental Status: He is alert and oriented to person, place, and time.  Psychiatric:        Mood and Affect: Mood normal.     Ortho Exam 5-5 strength external/internal rotation against resistance bilaterally.  Empty can test negative bilaterally.  Liftoff test negative bilaterally.  Positive impingement sign on the left. Specialty Comments:  No specialty comments available.  Imaging: No results found.   PMFS History: Patient Active Problem List   Diagnosis Date Noted  . 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 MDD (major depressive disorder) 03/06/2017   Past Medical History:  Diagnosis Date  . Alcohol abuse    Remission >20 years ago  . Allergy   .  Anxiety   . Asthma   . Depression    MAJOR DEPRESSIVE DISORDER  . Depression with anxiety   . Drug abuse ([facility])    Remission >20 years ago  . Encounter for HIV (human immunodeficiency virus) test 2014-2015  . Environmental allergies   . History of chicken pox   . Hyperlipidemia   . Hypertension   . Hypoalphalipoproteinemia, familial   . Lumbar pain   . Migraines   . Primary generalized (osteo)arthritis   . Seasonal allergies     Family History  Problem Relation Age of Onset  . Hypertension  Mother   . Hyperlipidemia Father   . Healthy Sister   . Colon cancer Maternal Grandfather   . Prostate cancer Maternal Grandfather   . Congestive Heart Failure Maternal Grandfather   . Dementia Paternal Grandmother   . Heart disease Paternal Grandfather   . Heart attack Paternal Grandfather   . Hypertension Maternal Uncle   . Hyperlipidemia Maternal Uncle   . Hepatitis Paternal Aunt   . Hepatitis C Paternal Aunt   . Hypertension Paternal Uncle   . Hyperlipidemia Paternal Uncle   . Healthy Son        x1  . Healthy Daughter        x4    Past Surgical History:  Procedure Laterality Date  . EXCISION MASS HEAD  03/17/2018   excision cells on head  ; still in testing for cancer per patient report   . LUMBAR EPIDURAL INJECTION    . UMBILICAL HERNIA REPAIR N/A 04/02/2018   Procedure: OPEN UMBILICAL HERNIA REPAIR WITH INSERTION OF MESH;  Surgeon: Andria Meuse, MD;  Location: [facility];  Service: General;  Laterality: N/A;  . WISDOM TOOTH EXTRACTION  1989-1990   Social History   Occupational History  . Not on file  Tobacco Use  . Smoking status: Former Smoker    Types: Cigarettes    Quit date: 03/07/1988    Years since quitting: 31.4  . Smokeless tobacco: Former Engineer, water and Sexual Activity  . Alcohol use: Not Currently  . Drug use: Not Currently  . Sexual activity: Yes

## 2019-08-11 ENCOUNTER — Other Ambulatory Visit: Payer: Self-pay | Admitting: Radiology

## 2019-08-11 ENCOUNTER — Encounter: Payer: Self-pay | Admitting: Orthopaedic Surgery

## 2019-08-11 DIAGNOSIS — G8929 Other chronic pain: Secondary | ICD-10-CM

## 2019-08-11 DIAGNOSIS — M25512 Pain in left shoulder: Secondary | ICD-10-CM

## 2019-08-25 ENCOUNTER — Other Ambulatory Visit: Payer: Self-pay

## 2019-08-25 ENCOUNTER — Encounter: Payer: Self-pay | Admitting: Physical Therapy

## 2019-08-25 ENCOUNTER — Ambulatory Visit: Payer: BC Managed Care – PPO | Attending: Physician Assistant | Admitting: Physical Therapy

## 2019-08-25 DIAGNOSIS — M25612 Stiffness of left shoulder, not elsewhere classified: Secondary | ICD-10-CM

## 2019-08-25 DIAGNOSIS — M25512 Pain in left shoulder: Secondary | ICD-10-CM | POA: Insufficient documentation

## 2019-08-25 DIAGNOSIS — G8929 Other chronic pain: Secondary | ICD-10-CM

## 2019-08-25 DIAGNOSIS — M6281 Muscle weakness (generalized): Secondary | ICD-10-CM | POA: Diagnosis not present

## 2019-08-25 NOTE — Therapy (Signed)
[facility] [address]  [address] [place], [place], [address] Phone: [phone]   Fax:  [phone]  Physical Therapy Evaluation  Patient Details  Name: George Ward MRN: [id number] Date of Birth: 08/31/1971 Referring Provider (PT): Richardean Canal, PA-C   Encounter Date: 08/25/2019  PT End of Session - 08/25/19 1649    Visit Number  1    Number of Visits  7    Date for PT Re-Evaluation  10/06/19    Authorization Type  BCBS    PT Start Time  1606    PT Stop Time  1645    PT Time Calculation (min)  39 min    Activity Tolerance  Patient tolerated treatment well;Patient limited by pain    Behavior During Therapy  [facility] for tasks assessed/performed       Past Medical History:  Diagnosis Date  . Alcohol abuse    Remission >20 years ago  . Allergy   . Anxiety   . Asthma   . Depression    MAJOR DEPRESSIVE DISORDER  . Depression with anxiety   . Drug abuse (HCC)    Remission >20 years ago  . Encounter for HIV (human immunodeficiency virus) test 2014-2015  . Environmental allergies   . History of chicken pox   . Hyperlipidemia   . Hypertension   . Hypoalphalipoproteinemia, familial   . Lumbar pain   . Migraines   . Primary generalized (osteo)arthritis   . Seasonal allergies     Past Surgical History:  Procedure Laterality Date  . EXCISION MASS HEAD  03/17/2018   excision cells on head  ; still in testing for cancer per patient report   . LUMBAR EPIDURAL INJECTION    . UMBILICAL HERNIA REPAIR N/A 04/02/2018   Procedure: OPEN UMBILICAL HERNIA REPAIR WITH INSERTION OF MESH;  Surgeon: Andria Meuse, MD;  Location: [facility];  Service: General;  Laterality: N/A;  . WISDOM TOOTH EXTRACTION  1989-1990    There were no vitals filed for this visit.   Subjective Assessment - 08/25/19 1607    Subjective  Patient reports insidious onset of L shoulder pain since January 2020. Was given cortisone shot in February  with little relief. Pain has gotten somewhat worst since initial onset. Has gotten yet another cortisone shot a month ago, again with little relief. Pain is located over L top of shoulder and "AC joint" as well as back of scapula. Denies N/T. Worse with reaching out, reaching overhead, bringing seatbelt across body, and reaching to close a door. Better with topical creams and ice.    Pertinent History  migraines, lumbar pain, HTN, HLD, drug & alcohol abuse, depression, anxiety, asthma, umbilical hernia repair    Limitations  Lifting;House hold activities    Diagnostic tests  10/03/2018 left shoulder MRI: low-grade articular surface tear involving supraspinatus tendon but no full-thickness cuff tear.  Subacromial subdeltoid bursal fluid consistent with bursitis was also seen.  Mild arthropathy of the [facility] joint.  Glenohumeral joint well-maintained.    Patient Stated Goals  avoid surgery    Currently in Pain?  Yes    Pain Score  5     Pain Location  Shoulder    Pain Orientation  Left   superior   Pain Descriptors / Indicators  Aching    Pain Type  Chronic pain         [facility] PT Assessment - 08/25/19 1617      Assessment  Medical Diagnosis  Chronic L shoulder pain    Referring Provider (PT)  Erskine Emery, PA-C    Onset Date/Surgical Date  09/10/18    Hand Dominance  Left;Right    Next MD Visit  08/26/19    Prior Therapy  yes- for LB and R shoulder      Precautions   Precautions  None      Balance Screen   Has the patient fallen in the past 6 months  No    Has the patient had a decrease in activity level because of a fear of falling?   No    Is the patient reluctant to leave their home because of a fear of falling?   No      Home Environment   Living Environment  Private residence    Available Help at Discharge  Family;Friend(s)    Type of Home  House      Prior Function   Level of Independence  Independent    Vocation  Full time employment    Equities trader-  working remotely     Leisure  exercising- lifting weights, pushups       Cognition   Overall Cognitive Status  Within Functional Limits for tasks assessed      Observation/Other Assessments   Focus on Therapeutic Outcomes (FOTO)   Shoulder: 51 (49% limited, 36% predicted)      Sensation   Light Touch  Appears Intact      Coordination   Gross Motor Movements are Fluid and Coordinated  Yes      Posture/Postural Control   Posture/Postural Control  Postural limitations    Postural Limitations  Rounded Shoulders      ROM / Strength   AROM / PROM / Strength  AROM;Strength      AROM   AROM Assessment Site  Shoulder    Right/Left Shoulder  Right;Left    Right Shoulder Flexion  168 Degrees    Right Shoulder ABduction  173 Degrees    Right Shoulder Internal Rotation  --   FIR T8   Right Shoulder External Rotation  --   FER T3   Left Shoulder Flexion  121 Degrees   mild-mod pain   Left Shoulder ABduction  103 Degrees   mild-mod pain   Left Shoulder Internal Rotation  --   FIR T10 mild pain   Left Shoulder External Rotation  --   FER C6 w. mild pain     Strength   Strength Assessment Site  Shoulder    Right/Left Shoulder  Right;Left    Right Shoulder Flexion  4+/5    Right Shoulder ABduction  4+/5    Right Shoulder Internal Rotation  4/5    Right Shoulder External Rotation  4+/5    Left Shoulder Flexion  4-/5    Left Shoulder ABduction  4-/5    Left Shoulder Internal Rotation  4-/5    Left Shoulder External Rotation  4/5      Palpation   Palpation comment  TTP over L UT, infraspinatus, scalenes, and proximal biceps tendon                Objective measurements completed on examination: See above findings.              PT Education - 08/25/19 1649    Education Details  prognosis, POC, HEP    Person(s) Educated  Patient    Methods  Explanation;Demonstration;Tactile cues;Verbal cues;Handout    Comprehension  Verbalized understanding;Returned  demonstration       PT Short Term Goals - 08/25/19 1656      PT SHORT TERM GOAL #1   Title  Patient to be independent with initial HEP.    Time  3    Period  Weeks    Status  New    Target Date  09/15/19        PT Long Term Goals - 08/25/19 1657      PT LONG TERM GOAL #1   Title  Patient to be independent with advanced HEP.    Time  6    Period  Weeks    Status  New    Target Date  10/06/19      PT LONG TERM GOAL #2   Title  Patient to demonstrate L shoulder strength >/=4+/5.    Time  6    Period  Weeks    Status  New    Target Date  10/06/19      PT LONG TERM GOAL #3   Title  Patient to demonstrate L shoulder AROM symmetrical to opposite UE and nonpainful.    Time  6    Period  Weeks    Status  New    Target Date  10/06/19      PT LONG TERM GOAL #4   Title  Patient to report 70% improvement in pain levels with putting on seatbelt and closing doors with L UE.    Time  7    Period  Weeks    Status  New    Target Date  10/06/19      PT LONG TERM GOAL #5   Title  Patient to demonstrate overhead lift with L UE with 5lbs and without pain.    Time  6    Period  Weeks    Status  New    Target Date  10/06/19             Plan - 08/25/19 1649    Clinical Impression Statement  Patient is a 48y/o M presenting to OPPT with c/o chronic insidious L shoulder pain since January 2020. MRI on 10/03/18 revealed surface tear of the supraspinatus and subacromial bursitis.  Pain is located over L superiors shoulder and muscle belly of supraspinatus. Worse with abduction, flexion, and rotary movements like bringing seatbelt across body, and reaching to close a door. Patient today presenting with limited and painful L shoulder AROM, decreased L shoulder strength, rounded posture, and tenderness to palpation over L UT, infraspinatus, scalenes, and proximal biceps tendon. Patient educated on gentle AAROM and strengthening HEP. Patient reported understanding. Would benefit from  skilled PT services 1x.week for 6 weeks to address aforementioned impairments.    Personal Factors and Comorbidities  Age;Sex;Comorbidity 3+;Time since onset of injury/illness/exacerbation;Fitness;Past/Current Experience;Profession    Comorbidities  migraines, lumbar pain, HTN, HLD, drug & alcohol abuse, depression, anxiety, asthma, umbilical hernia repair    Examination-Activity Limitations  Sleep;Caring for Others;Carry;Toileting;Dressing;Transfers;Hygiene/Grooming;Lift;Reach Overhead    Examination-Participation Restrictions  Cleaning;Shop;Community Activity;Driving;Yard Work;Interpersonal Relationship;Laundry;Meal Prep    Stability/Clinical Decision Making  Stable/Uncomplicated    Clinical Decision Making  Low    Rehab Potential  Good    PT Frequency  1x / week    PT Duration  6 weeks    PT Treatment/Interventions  ADLs/Self Care Home Management;Cryotherapy;Electrical Stimulation;Iontophoresis 4mg /ml Dexamethasone;Moist Heat;Therapeutic exercise;Therapeutic activities;Functional mobility training;Ultrasound;Neuromuscular re-education;Patient/family education;Manual techniques;Vasopneumatic Device;Taping;Energy conservation;Dry needling;Passive range of motion    PT Next Visit Plan  reassess  HEP and progress AAROM, ROM, gentle strengthening    Consulted and Agree with Plan of Care  Patient       Patient will benefit from skilled therapeutic intervention in order to improve the following deficits and impairments:  Decreased activity tolerance, Decreased strength, Pain, Impaired UE functional use, Increased muscle spasms, Improper body mechanics, Decreased range of motion, Impaired flexibility, Postural dysfunction  Visit Diagnosis: Chronic left shoulder pain  Stiffness of left shoulder, not elsewhere classified  Muscle weakness (generalized)     Problem List Patient Active Problem List   Diagnosis Date Noted  . 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 MDD (major depressive  disorder) 03/06/2017    Anette Guarneri, PT, DPT 08/25/19 5:00 PM    [facility] [address]  [address] [place], [place], [address] Phone: [phone]   Fax:  [phone]  Name: George Ward MRN: [id number] Date of Birth: 1971-03-22

## 2019-08-26 ENCOUNTER — Encounter: Payer: Self-pay | Admitting: Orthopaedic Surgery

## 2019-08-26 ENCOUNTER — Ambulatory Visit: Payer: BC Managed Care – PPO | Admitting: Orthopaedic Surgery

## 2019-08-26 DIAGNOSIS — M7532 Calcific tendinitis of left shoulder: Secondary | ICD-10-CM

## 2019-08-26 DIAGNOSIS — M7542 Impingement syndrome of left shoulder: Secondary | ICD-10-CM

## 2019-08-26 DIAGNOSIS — M25512 Pain in left shoulder: Secondary | ICD-10-CM | POA: Diagnosis not present

## 2019-08-26 DIAGNOSIS — G8929 Other chronic pain: Secondary | ICD-10-CM | POA: Diagnosis not present

## 2019-08-26 NOTE — Progress Notes (Signed)
The patient comes in today to go over an MRI of his left shoulder.  He has been having chronic shoulder pain for many months now.  We have tried now to subacromial steroid injections that left shoulder.  His x-rays were concerning for calcific tendinitis.  Due to chronic shoulder pain and the failure of conservative treatment we sent her for an MRI to assess the rotator cuff.  He is also been going to physical therapy formally as an outpatient for his left shoulder.  On exam he still has positive Neer and Hawkins signs of the left shoulder.  There is a lot of pain when I stressed the rotator cuff itself.  His internal rotation with adduction is limited.  The MRI is reviewed with him.  There is a partial-thickness rotator cuff tear that is on the articular surface side which is only 25%.  There is tendinosis of the rotator cuff itself.  There is arthritic changes at the [facility] joint.  There is no muscle atrophy or evidence of a full-thickness tear.  At this point I am recommending arthroscopic intervention.  I would like to see him back in 4 weeks and I did give him our surgery scheduler's card because I want him to see how the next 2 weeks ago before considering a surgical intervention.  I described in detail using a shoulder model what arthroscopic intervention would involve.  I do feel that we are likely heading in that direction given the amount of pain he is still having with his shoulder and based on the plain film and MRI findings.  The last injection we have provided just 2 weeks ago has not really helped much at all.  We did have a long and thorough discussion about the shoulder.  I did describe what surgery involves in detail.  We will see him back in 4 weeks to see how he is doing overall but if before then he decides to have surgery scheduled he will give [place] a call.

## 2019-08-31 ENCOUNTER — Ambulatory Visit: Payer: BC Managed Care – PPO

## 2019-08-31 ENCOUNTER — Other Ambulatory Visit: Payer: Self-pay

## 2019-08-31 DIAGNOSIS — M6281 Muscle weakness (generalized): Secondary | ICD-10-CM | POA: Diagnosis not present

## 2019-08-31 DIAGNOSIS — M25512 Pain in left shoulder: Secondary | ICD-10-CM | POA: Diagnosis not present

## 2019-08-31 DIAGNOSIS — M25612 Stiffness of left shoulder, not elsewhere classified: Secondary | ICD-10-CM | POA: Diagnosis not present

## 2019-08-31 DIAGNOSIS — G8929 Other chronic pain: Secondary | ICD-10-CM

## 2019-08-31 NOTE — Therapy (Signed)
[facility] [address]  [place], [place], [address] Phone: [phone]   Fax:  [phone]  Physical Therapy Treatment  Patient Details  Name: George Ward MRN: [id number] Date of Birth: February 25, 1971 Referring Provider (PT): Erskine Emery, PA-C   Encounter Date: 08/31/2019  PT End of Session - 08/31/19 1632    Visit Number  2    Number of Visits  7    Date for PT Re-Evaluation  10/06/19    Authorization Type  BCBS    PT Start Time  1618    PT Stop Time  1720    PT Time Calculation (min)  62 min    Activity Tolerance  Patient tolerated treatment well;Patient limited by pain    Behavior During Therapy  [facility] for tasks assessed/performed       Past Medical History:  Diagnosis Date  . Alcohol abuse    Remission >20 years ago  . Allergy   . Anxiety   . Asthma   . Depression    MAJOR DEPRESSIVE DISORDER  . Depression with anxiety   . Drug abuse ([place])    Remission >20 years ago  . Encounter for HIV (human immunodeficiency virus) test 2014-2015  . Environmental allergies   . History of chicken pox   . Hyperlipidemia   . Hypertension   . Hypoalphalipoproteinemia, familial   . Lumbar pain   . Migraines   . Primary generalized (osteo)arthritis   . Seasonal allergies     Past Surgical History:  Procedure Laterality Date  . EXCISION MASS HEAD  03/17/2018   excision cells on head  ; still in testing for cancer per patient report   . LUMBAR EPIDURAL INJECTION    . UMBILICAL HERNIA REPAIR N/A 04/02/2018   Procedure: OPEN UMBILICAL HERNIA REPAIR WITH INSERTION OF MESH;  Surgeon: Ileana Roup, MD;  Location: [facility];  Service: General;  Laterality: N/A;  . WISDOM TOOTH EXTRACTION  1989-1990    There were no vitals filed for this visit.  Subjective Assessment - 08/31/19 1621    Subjective  Pt. noting he spoke with the MD regarding his MRI results.    Pertinent History  migraines, lumbar pain, HTN,  HLD, drug & alcohol abuse, depression, anxiety, asthma, umbilical hernia repair    Diagnostic tests  10/03/2018 left shoulder MRI: low-grade articular surface tear involving supraspinatus tendon but no full-thickness cuff tear.  Subacromial subdeltoid bursal fluid consistent with bursitis was also seen.  Mild arthropathy of the [facility] joint.  Glenohumeral joint well-maintained.    Patient Stated Goals  avoid surgery    Currently in Pain?  Yes    Pain Score  3     Pain Location  Shoulder    Pain Orientation  Left    Pain Descriptors / Indicators  Aching    Pain Type  Chronic pain    Pain Frequency  Constant    Pain Relieving Factors  icing, occasional ibuprofen,    Multiple Pain Sites  No                       [facility] Adult PT Treatment/Exercise - 08/31/19 0001      Self-Care   Self-Care  Other Self-Care Comments    Other Self-Care Comments   Discussed proper desk setup and positioning to avoid excessive postural strain       Shoulder Exercises: Supine   Protraction  Both;15 reps  Protraction Weight (lbs)  5    Protraction Limitations  5# on cane and B UE protraction serratus press     External Rotation  Left;10 reps;AAROM    External Rotation Limitations  wand    Flexion  Left;10 reps;AAROM    Flexion Limitations  wand    ABduction  Left;10 reps;AAROM    ABduction Limitations  wand - cues for scaption motion required due to poor tolerance for full abduction      Shoulder Exercises: Prone   Retraction  Left;15 reps;Strengthening;Weights    Retraction Weight (lbs)  3    Retraction Limitations  cues for full scap. retraction     Extension  Left;15 reps    Extension Weight (lbs)  2    Extension Limitations  cues for full scapular retraction       Shoulder Exercises: Standing   Extension  15 reps;Both;Strengthening;Theraband    Theraband Level (Shoulder Extension)  Level 1 (Yellow)    Extension Limitations  cues for scapular retraction     Row  Both;15  reps;Strengthening;Theraband    Theraband Level (Shoulder Row)  Level 2 (Red)    Row Limitations  cues for scap. retraction       Shoulder Exercises: Pulleys   Flexion  3 minutes    Flexion Limitations  only mild discomfort noted     Scaption  3 minutes    Scaption Limitations  only mild discomfort noted       Shoulder Exercises: Isometric Strengthening   External Rotation Limitations  yellow TB isometric stepouts with elbow in neutral    5" x 10 reps   Internal Rotation Limitations  yellow TB isometric step-outs with elbow in neutral position       Modalities   Modalities  Vasopneumatic;Moist Heat      Moist Heat Therapy   Number Minutes Moist Heat  15 Minutes    Moist Heat Location  Shoulder   L UT     Vasopneumatic   Number Minutes Vasopneumatic   15 minutes    Vasopnuematic Location   Shoulder   L    Vasopneumatic Pressure  Low    Vasopneumatic Temperature   coldest temp.             PT Education - 08/31/19 1734    Education Details  HEP update;  band row (red issued to pt.), band extension (band issued to pt.), AAROM wand ER in supine    Person(s) Educated  Patient    Methods  Explanation;Demonstration;Verbal cues;Handout    Comprehension  Verbalized understanding;Returned demonstration;Verbal cues required       PT Short Term Goals - 08/31/19 1634      PT SHORT TERM GOAL #1   Title  Patient to be independent with initial HEP.    Time  3    Period  Weeks    Status  On-going    Target Date  09/15/19        PT Long Term Goals - 08/31/19 1634      PT LONG TERM GOAL #1   Title  Patient to be independent with advanced HEP.    Time  6    Period  Weeks    Status  On-going      PT LONG TERM GOAL #2   Title  Patient to demonstrate L shoulder strength >/=4+/5.    Time  6    Period  Weeks    Status  On-going      PT  LONG TERM GOAL #3   Title  Patient to demonstrate L shoulder AROM symmetrical to opposite UE and nonpainful.    Time  6    Period   Weeks    Status  On-going      PT LONG TERM GOAL #4   Title  Patient to report 70% improvement in pain levels with putting on seatbelt and closing doors with L UE.    Time  7    Period  Weeks    Status  On-going      PT LONG TERM GOAL #5   Title  Patient to demonstrate overhead lift with L UE with 5lbs and without pain.    Time  6    Period  Weeks    Status  On-going            Plan - 08/31/19 1636    Clinical Impression Statement  Mikhi doing well and demonstrating good understanding of need to avoid painful arc with supine AAROM activities.  Tolerated progression of scapular strengthening to protraction press in supine, red TB row and yellow TB extension row.  HEP updated with scapular strengthening and AAROM ER in supine.  Pt. ended visit with some remaining L shoulder pain thus applied ice/compression to L shoulder and heat to L UT for relaxation.    Comorbidities  migraines, lumbar pain, HTN, HLD, drug & alcohol abuse, depression, anxiety, asthma, umbilical hernia repair    Rehab Potential  Good    PT Treatment/Interventions  ADLs/Self Care Home Management;Cryotherapy;Electrical Stimulation;Iontophoresis 4mg /ml Dexamethasone;Moist Heat;Therapeutic exercise;Therapeutic activities;Functional mobility training;Ultrasound;Neuromuscular re-education;Patient/family education;Manual techniques;Vasopneumatic Device;Taping;Energy conservation;Dry needling;Passive range of motion    PT Next Visit Plan  reassess HEP and progress AAROM, ROM, gentle strengthening    Consulted and Agree with Plan of Care  Patient       Patient will benefit from skilled therapeutic intervention in order to improve the following deficits and impairments:  Decreased activity tolerance, Decreased strength, Pain, Impaired UE functional use, Increased muscle spasms, Improper body mechanics, Decreased range of motion, Impaired flexibility, Postural dysfunction  Visit Diagnosis: Chronic left shoulder  pain  Stiffness of left shoulder, not elsewhere classified  Muscle weakness (generalized)     Problem List Patient Active Problem List   Diagnosis Date Noted  . 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 MDD (major depressive disorder) 03/06/2017    03/08/2017, PTA 08/31/19 6:05 PM    [facility] [address]  [address] [place], [place], [place] Phone: [phone]   Fax:  [phone]  Name: HARVEY MATLACK MRN: Shela Leff Date of Birth: 1971/09/09

## 2019-09-02 ENCOUNTER — Other Ambulatory Visit: Payer: Self-pay | Admitting: Medical

## 2019-09-07 ENCOUNTER — Ambulatory Visit: Payer: BC Managed Care – PPO

## 2019-09-07 ENCOUNTER — Other Ambulatory Visit: Payer: Self-pay

## 2019-09-07 DIAGNOSIS — M6281 Muscle weakness (generalized): Secondary | ICD-10-CM | POA: Diagnosis not present

## 2019-09-07 DIAGNOSIS — G8929 Other chronic pain: Secondary | ICD-10-CM

## 2019-09-07 DIAGNOSIS — M25512 Pain in left shoulder: Secondary | ICD-10-CM | POA: Diagnosis not present

## 2019-09-07 DIAGNOSIS — M25612 Stiffness of left shoulder, not elsewhere classified: Secondary | ICD-10-CM

## 2019-09-07 NOTE — Therapy (Signed)
[facility] [place] [address]  [place], [place], [address] Phone: [phone]   Fax:  [phone]  Physical Therapy Treatment  Patient Details  Name: George Ward MRN: [id number] Date of Birth: 1971/02/05 Referring Provider (PT): Erskine Emery, PA-C   Encounter Date: 09/07/2019  PT End of Session - 09/07/19 1623    Visit Number  3    Number of Visits  7    Date for PT Re-Evaluation  10/06/19    Authorization Type  BCBS    PT Start Time  1613    PT Stop Time  1710    PT Time Calculation (min)  57 min    Activity Tolerance  Patient tolerated treatment well;Patient limited by pain    Behavior During Therapy  [facility] for tasks assessed/performed       Past Medical History:  Diagnosis Date  . Alcohol abuse    Remission >20 years ago  . Allergy   . Anxiety   . Asthma   . Depression    MAJOR DEPRESSIVE DISORDER  . Depression with anxiety   . Drug abuse ([place])    Remission >20 years ago  . Encounter for HIV (human immunodeficiency virus) test 2014-2015  . Environmental allergies   . History of chicken pox   . Hyperlipidemia   . Hypertension   . Hypoalphalipoproteinemia, familial   . Lumbar pain   . Migraines   . Primary generalized (osteo)arthritis   . Seasonal allergies     Past Surgical History:  Procedure Laterality Date  . EXCISION MASS HEAD  03/17/2018   excision cells on head  ; still in testing for cancer per patient report   . LUMBAR EPIDURAL INJECTION    . UMBILICAL HERNIA REPAIR N/A 04/02/2018   Procedure: OPEN UMBILICAL HERNIA REPAIR WITH INSERTION OF MESH;  Surgeon: Ileana Roup, MD;  Location: [facility];  Service: General;  Laterality: N/A;  . WISDOM TOOTH EXTRACTION  1989-1990    There were no vitals filed for this visit.  Subjective Assessment - 09/07/19 1617    Subjective  Reports he had to replace sink on Saturday and had increased L shoulder pain afterwards.    Pertinent History   migraines, lumbar pain, HTN, HLD, drug & alcohol abuse, depression, anxiety, asthma, umbilical hernia repair    Diagnostic tests  10/03/2018 left shoulder MRI: low-grade articular surface tear involving supraspinatus tendon but no full-thickness cuff tear.  Subacromial subdeltoid bursal fluid consistent with bursitis was also seen.  Mild arthropathy of the [facility] joint.  Glenohumeral joint well-maintained.    Patient Stated Goals  avoid surgery    Currently in Pain?  Yes    Pain Score  3     Pain Location  Shoulder    Pain Orientation  Left    Pain Descriptors / Indicators  Sore    Pain Type  Chronic pain                       [facility] Adult PT Treatment/Exercise - 09/07/19 0001      Shoulder Exercises: Supine   Protraction  Left;15 reps;Weights    Protraction Weight (lbs)  3     External Rotation  Left;10 reps;AAROM    External Rotation Limitations  wand      Shoulder Exercises: Standing   External Rotation  Left;10 reps;Theraband;Strengthening    Theraband Level (Shoulder External Rotation)  Level 1 (Yellow)    External  Rotation Limitations  bolster under elbow     Internal Rotation  Left;10 reps;Theraband;Strengthening    Theraband Level (Shoulder Internal Rotation)  Level 1 (Yellow)    Internal Rotation Limitations  bolster under elbow     Extension  15 reps;Both;Strengthening;Theraband    Theraband Level (Shoulder Extension)  Level 2 (Red)    Extension Limitations  cues for scapular retraction     Row  Both;15 reps;Strengthening;Theraband    Theraband Level (Shoulder Row)  Level 2 (Red)    Row Limitations  cues for scap. retraction       Shoulder Exercises: ROM/Strengthening   UBE (Upper Arm Bike)  Lvl 1.0, 3 min forwards/3 min backwards     Cybex Row  15 reps    Cybex Row Limitations  15#  - low handles       Modalities   Modalities  Electrical Stimulation      Electrical Stimulation   Electrical Stimulation Location  L shoulder complex    Electrical  Stimulation Action  IFC    Electrical Stimulation Parameters  80-150Hz , intensity to pt. tolerance, 10'    Electrical Stimulation Goals  Pain             PT Education - 09/07/19 1711    Education Details  HEP update; 3-way doorway pec stretch    Person(s) Educated  Patient    Methods  Explanation;Demonstration;Verbal cues;Handout    Comprehension  Verbalized understanding;Returned demonstration;Verbal cues required       PT Short Term Goals - 08/31/19 1634      PT SHORT TERM GOAL #1   Title  Patient to be independent with initial HEP.    Time  3    Period  Weeks    Status  On-going    Target Date  09/15/19        PT Long Term Goals - 08/31/19 1634      PT LONG TERM GOAL #1   Title  Patient to be independent with advanced HEP.    Time  6    Period  Weeks    Status  On-going      PT LONG TERM GOAL #2   Title  Patient to demonstrate L shoulder strength >/=4+/5.    Time  6    Period  Weeks    Status  On-going      PT LONG TERM GOAL #3   Title  Patient to demonstrate L shoulder AROM symmetrical to opposite UE and nonpainful.    Time  6    Period  Weeks    Status  On-going      PT LONG TERM GOAL #4   Title  Patient to report 70% improvement in pain levels with putting on seatbelt and closing doors with L UE.    Time  7    Period  Weeks    Status  On-going      PT LONG TERM GOAL #5   Title  Patient to demonstrate overhead lift with L UE with 5lbs and without pain.    Time  6    Period  Weeks    Status  On-going            Plan - 09/07/19 1624    Clinical Impression Statement  Pt. noting L shoulder painful "flare-up" after having to replace the sink in his bathroom over the weekend.  Does feel that this increased soreness has somewhat subsided since Saturday.  Able to tolerated progression of red  TB resisted scapular strengthening and RTC strengthening well today with decreased in pain by end of session.  Pt. requiring cueing with scapular strengthening  for proper scap. retraction/depression with good carryover following this.  Ended session with some remaining L shoulder soreness thus trialed E-stim for pain relief with good response.  Will continue to progress toward goals.    Comorbidities  migraines, lumbar pain, HTN, HLD, drug & alcohol abuse, depression, anxiety, asthma, umbilical hernia repair    Rehab Potential  Good    PT Treatment/Interventions  ADLs/Self Care Home Management;Cryotherapy;Electrical Stimulation;Iontophoresis 4mg /ml Dexamethasone;Moist Heat;Therapeutic exercise;Therapeutic activities;Functional mobility training;Ultrasound;Neuromuscular re-education;Patient/family education;Manual techniques;Vasopneumatic Device;Taping;Energy conservation;Dry needling;Passive range of motion    PT Next Visit Plan  Progress AAROM, ROM, gentle strengthening    Consulted and Agree with Plan of Care  Patient       Patient will benefit from skilled therapeutic intervention in order to improve the following deficits and impairments:  Decreased activity tolerance, Decreased strength, Pain, Impaired UE functional use, Increased muscle spasms, Improper body mechanics, Decreased range of motion, Impaired flexibility, Postural dysfunction  Visit Diagnosis: Chronic left shoulder pain  Stiffness of left shoulder, not elsewhere classified  Muscle weakness (generalized)     Problem List Patient Active Problem List   Diagnosis Date Noted  . 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 MDD (major depressive disorder) 03/06/2017    03/08/2017, PTA 09/07/19 6:07 PM   [facility] [address]  [address] [place], [place], [place] Phone: [phone]   Fax:  [phone]  Name: JEHU MCCAUSLIN MRN: Shela Leff Date of Birth: 02/24/1971

## 2019-09-11 HISTORY — PX: SHOULDER ARTHROSCOPY: SHX128

## 2019-09-14 ENCOUNTER — Encounter: Payer: Self-pay | Admitting: Physical Therapy

## 2019-09-14 ENCOUNTER — Ambulatory Visit: Payer: BC Managed Care – PPO | Attending: Physician Assistant | Admitting: Physical Therapy

## 2019-09-14 ENCOUNTER — Other Ambulatory Visit: Payer: Self-pay

## 2019-09-14 DIAGNOSIS — M6281 Muscle weakness (generalized): Secondary | ICD-10-CM | POA: Diagnosis not present

## 2019-09-14 DIAGNOSIS — M25612 Stiffness of left shoulder, not elsewhere classified: Secondary | ICD-10-CM | POA: Diagnosis not present

## 2019-09-14 DIAGNOSIS — G8929 Other chronic pain: Secondary | ICD-10-CM | POA: Diagnosis not present

## 2019-09-14 DIAGNOSIS — M25512 Pain in left shoulder: Secondary | ICD-10-CM | POA: Diagnosis not present

## 2019-09-14 NOTE — Therapy (Signed)
[facility] [address]  [address] [place], [place], [address] Phone: [phone]   Fax:  [phone]  Physical Therapy Treatment  Patient Details  Name: George Ward MRN: [id number] Date of Birth: 03-21-1971 Referring Provider (PT): Richardean Canal, PA-C   Encounter Date: 09/14/2019  PT End of Session - 09/14/19 1657    Visit Number  4    Number of Visits  7    Date for PT Re-Evaluation  10/06/19    Authorization Type  BCBS    PT Start Time  1617    PT Stop Time  1656    PT Time Calculation (min)  39 min    Activity Tolerance  Patient tolerated treatment well    Behavior During Therapy  [facility] for tasks assessed/performed       Past Medical History:  Diagnosis Date  . Alcohol abuse    Remission >20 years ago  . Allergy   . Anxiety   . Asthma   . Depression    MAJOR DEPRESSIVE DISORDER  . Depression with anxiety   . Drug abuse ([facility])    Remission >20 years ago  . Encounter for HIV (human immunodeficiency virus) test 2014-2015  . Environmental allergies   . History of chicken pox   . Hyperlipidemia   . Hypertension   . Hypoalphalipoproteinemia, familial   . Lumbar pain   . Migraines   . Primary generalized (osteo)arthritis   . Seasonal allergies     Past Surgical History:  Procedure Laterality Date  . EXCISION MASS HEAD  03/17/2018   excision cells on head  ; still in testing for cancer per patient report   . LUMBAR EPIDURAL INJECTION    . UMBILICAL HERNIA REPAIR N/A 04/02/2018   Procedure: OPEN UMBILICAL HERNIA REPAIR WITH INSERTION OF MESH;  Surgeon: Andria Meuse, MD;  Location: [facility];  Service: General;  Laterality: N/A;  . WISDOM TOOTH EXTRACTION  1989-1990    There were no vitals filed for this visit.  Subjective Assessment - 09/14/19 1617    Subjective  Feeling sore today. Was recently taking down christmas lights.    Pertinent History  migraines, lumbar pain, HTN, HLD, drug & alcohol  abuse, depression, anxiety, asthma, umbilical hernia repair    Diagnostic tests  10/03/2018 left shoulder MRI: low-grade articular surface tear involving supraspinatus tendon but no full-thickness cuff tear.  Subacromial subdeltoid bursal fluid consistent with bursitis was also seen.  Mild arthropathy of the [facility] joint.  Glenohumeral joint well-maintained.    Patient Stated Goals  avoid surgery    Currently in Pain?  Yes    Pain Location  Shoulder    Pain Orientation  Left    Pain Descriptors / Indicators  Sore    Pain Type  Chronic pain                       [facility] Adult PT Treatment/Exercise - 09/14/19 0001      Exercises   Exercises  Shoulder      Shoulder Exercises: Supine   External Rotation  Left;AAROM;5 reps    External Rotation Limitations  wand    Flexion  Left;AAROM;5 reps    Flexion Limitations  wand    ABduction  Left;AAROM;5 reps    ABduction Limitations  scaption w/ wand   cues to maintain scaption   Other Supine Exercises  L shoulder flexion with 1# x10   within limited/tolerable ROM  Shoulder Exercises: Sidelying   External Rotation  Strengthening;Left;10 reps    External Rotation Limitations  10x 0#, 10x 1#   cues to stop at neutral   ABduction  Strengthening;Left;10 reps    ABduction Limitations  thumb up; 5x 0#, 10x 1#      Shoulder Exercises: ROM/Strengthening   UBE (Upper Arm Bike)  Lvl 1.0, 3 min forwards/3 min backwards       Modalities   Modalities  Iontophoresis      Iontophoresis   Type of Iontophoresis  Dexamethasone   #1/6   Location  R posterior shoulder    Dose  1.0 ml; 80 mA*min    Time  6 hour patch             PT Education - 09/14/19 1656    Education Details  update to HEP; edu on ionto use, precautions, wear time, removal    Person(s) Educated  Patient    Methods  Explanation;Demonstration;Verbal cues;Tactile cues;Handout    Comprehension  Verbalized understanding;Returned demonstration       PT Short Term  Goals - 09/14/19 1704      PT SHORT TERM GOAL #1   Title  Patient to be independent with initial HEP.    Time  3    Period  Weeks    Status  On-going    Target Date  09/15/19        PT Long Term Goals - 08/31/19 1634      PT LONG TERM GOAL #1   Title  Patient to be independent with advanced HEP.    Time  6    Period  Weeks    Status  On-going      PT LONG TERM GOAL #2   Title  Patient to demonstrate L shoulder strength >/=4+/5.    Time  6    Period  Weeks    Status  On-going      PT LONG TERM GOAL #3   Title  Patient to demonstrate L shoulder AROM symmetrical to opposite UE and nonpainful.    Time  6    Period  Weeks    Status  On-going      PT LONG TERM GOAL #4   Title  Patient to report 70% improvement in pain levels with putting on seatbelt and closing doors with L UE.    Time  7    Period  Weeks    Status  On-going      PT LONG TERM GOAL #5   Title  Patient to demonstrate overhead lift with L UE with 5lbs and without pain.    Time  6    Period  Weeks    Status  On-going            Plan - 09/14/19 1658    Clinical Impression Statement  Patient reporting increased soreness in L shoulder today after climbing up to the roof and taking down Christmas lights. Also did about 12 hours of work at [employer], which he believes may also have contributed. Reviewed supine AAROM with patient requiring cues to adjust angle of movement. Patient did report increased pain with these exercises today d/t flare, but able to tolerate. Introduced supine and sidelying RTC and strengthening with light weight. Patient advised to move within tolerable limits. Update HEP with exercises that were well-tolerated today. Patient reported understanding. Ended session with iontophoresis application to L posterior shoulder for pain relief as this was approved by  referring PA. Patient reported understanding of ionto use, precautions, wear time, and removal. No complaints at end of  session.    Comorbidities  migraines, lumbar pain, HTN, HLD, drug & alcohol abuse, depression, anxiety, asthma, umbilical hernia repair    Rehab Potential  Good    PT Treatment/Interventions  ADLs/Self Care Home Management;Cryotherapy;Electrical Stimulation;Iontophoresis 4mg /ml Dexamethasone;Moist Heat;Therapeutic exercise;Therapeutic activities;Functional mobility training;Ultrasound;Neuromuscular re-education;Patient/family education;Manual techniques;Vasopneumatic Device;Taping;Energy conservation;Dry needling;Passive range of motion    PT Next Visit Plan  Progress AAROM, ROM, gentle strengthening    Consulted and Agree with Plan of Care  Patient       Patient will benefit from skilled therapeutic intervention in order to improve the following deficits and impairments:  Decreased activity tolerance, Decreased strength, Pain, Impaired UE functional use, Increased muscle spasms, Improper body mechanics, Decreased range of motion, Impaired flexibility, Postural dysfunction  Visit Diagnosis: Chronic left shoulder pain  Stiffness of left shoulder, not elsewhere classified  Muscle weakness (generalized)     Problem List Patient Active Problem List   Diagnosis Date Noted  . 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 MDD (major depressive disorder) 03/06/2017     Janene Harvey, PT, DPT 09/14/19 5:58 PM   [facility] [address]  [place], [place], [address] Phone: [phone]   Fax:  [phone]  Name: George Ward MRN: [id number] Date of Birth: 1970-12-15

## 2019-09-14 NOTE — Patient Instructions (Signed)
    IONTOPHORESIS PATIENT PRECAUTIONS & CONTRAINDICATIONS:  Redness under one or both electrodes can occur.  This characterized by a uniform redness that usually disappears within 12 hours of treatment. Small pinhead size blisters may result in response to the drug.  Contact your physician if the problem persists more than 24 hours. On rare occasions, iontophoresis therapy can result in temporary skin reactions such as rash, inflammation, irritation or burns.  The skin reactions may be the result of individual sensitivity to the ionic solution used, the condition of the skin at the start of treatment, reaction to the materials in the electrodes, allergies or sensitivity to dexamethasone, or a poor connection between the patch and your skin.  Discontinue using iontophoresis if you have any of these reactions and report to your therapist. Remove the Patch or electrodes if you have any undue sensation of pain or burning during the treatment and report discomfort to your therapist. Tell your Therapist if you have had known adverse reactions to the application of electrical current. Approximate treatment time is 4-6 hours.  Remove the patch after 6 hours. The Patch can be worn during normal activity, however excessive motion where the electrodes have been placed can cause poor contact between the skin and the electrode or uneven electrical current resulting in greater risk of skin irritation. Keep out of the reach of children.   DO NOT use if you have a cardiac pacemaker or any other electrically sensitive implanted device. DO NOT use if you have a known sensitivity to dexamethasone. DO NOT use during Magnetic Resonance Imaging (MRI). DO NOT use over broken or compromised skin (e.g. sunburn, cuts, or acne) due to the increased risk of skin reaction. DO NOT SHAVE over the area to be treated:  To establish good contact between the Patch and the skin, excessive hair may be clipped. DO NOT place the  Patch or electrodes on or over your eyes, directly over your heart, or brain. DO NOT reuse the Patch or electrodes as this may cause burns to occur.   For questions, please contact your therapist at:  [facility] [place] [address]  [address] [place], [place], [address] Phone: [phone]   Fax:  [phone]  

## 2019-09-21 ENCOUNTER — Ambulatory Visit: Payer: BC Managed Care – PPO | Admitting: Physical Therapy

## 2019-09-21 ENCOUNTER — Other Ambulatory Visit: Payer: Self-pay

## 2019-09-21 ENCOUNTER — Encounter: Payer: Self-pay | Admitting: Physical Therapy

## 2019-09-21 DIAGNOSIS — G8929 Other chronic pain: Secondary | ICD-10-CM

## 2019-09-21 DIAGNOSIS — M6281 Muscle weakness (generalized): Secondary | ICD-10-CM | POA: Diagnosis not present

## 2019-09-21 DIAGNOSIS — M25612 Stiffness of left shoulder, not elsewhere classified: Secondary | ICD-10-CM

## 2019-09-21 DIAGNOSIS — M25512 Pain in left shoulder: Secondary | ICD-10-CM

## 2019-09-21 NOTE — Therapy (Signed)
[facility] [address]  [place], [place], [address] Phone: [phone]   Fax:  [phone]  Physical Therapy Treatment  Patient Details  Name: George Ward MRN: [id number] Date of Birth: [birth year] Referring Provider (PT): Erskine Emery, PA-C   Encounter Date: 09/21/2019  PT End of Session - 09/21/19 1704    Visit Number  5    Number of Visits  7    Date for PT Re-Evaluation  10/06/19    Authorization Type  BCBS    PT Start Time  1609    PT Stop Time  1711    PT Time Calculation (min)  62 min    Activity Tolerance  Patient tolerated treatment well;Patient limited by pain    Behavior During Therapy  [facility] for tasks assessed/performed       Past Medical History:  Diagnosis Date  . Alcohol abuse    Remission >20 years ago  . Allergy   . Anxiety   . Asthma   . Depression    MAJOR DEPRESSIVE DISORDER  . Depression with anxiety   . Drug abuse ([place])    Remission >20 years ago  . Encounter for HIV (human immunodeficiency virus) test 2014-2015  . Environmental allergies   . History of chicken pox   . Hyperlipidemia   . Hypertension   . Hypoalphalipoproteinemia, familial   . Lumbar pain   . Migraines   . Primary generalized (osteo)arthritis   . Seasonal allergies     Past Surgical History:  Procedure Laterality Date  . EXCISION MASS HEAD  03/17/2018   excision cells on head  ; still in testing for cancer per patient report   . LUMBAR EPIDURAL INJECTION    . UMBILICAL HERNIA REPAIR N/A 04/02/2018   Procedure: OPEN UMBILICAL HERNIA REPAIR WITH INSERTION OF MESH;  Surgeon: Ileana Roup, MD;  Location: [facility];  Service: General;  Laterality: N/A;  . WISDOM TOOTH EXTRACTION  1989-1990    There were no vitals filed for this visit.  Subjective Assessment - 09/21/19 1610    Subjective  Pain feels more anterior recently. Having discomfort after doing "things" so he has been icing. Back has been  bothering him recently as well. Has been working at the desk more recently since the conclusion of winter break. Reports 15-20% improvement in L shoulder thus far.    Pertinent History  migraines, lumbar pain, HTN, HLD, drug & alcohol abuse, depression, anxiety, asthma, umbilical hernia repair    Diagnostic tests  10/03/2018 left shoulder MRI: low-grade articular surface tear involving supraspinatus tendon but no full-thickness cuff tear.  Subacromial subdeltoid bursal fluid consistent with bursitis was also seen.  Mild arthropathy of the [facility] joint.  Glenohumeral joint well-maintained.    Patient Stated Goals  avoid surgery    Currently in Pain?  Yes    Pain Score  3     Pain Location  Shoulder    Pain Orientation  Left    Pain Descriptors / Indicators  Aching;Sore    Pain Type  Chronic pain    Multiple Pain Sites  Yes    Pain Location  Back    Pain Orientation  Left;Mid    Pain Descriptors / Indicators  Aching    Pain Type  Acute pain         [facility] PT Assessment - 09/21/19 0001      AROM   Left Shoulder Flexion  131 Degrees  Left Shoulder ABduction  106 Degrees    Left Shoulder Internal Rotation  --   FIR T10 w/ mild pain   Left Shoulder External Rotation  --   FER T2 w/ mild pain     Strength   Left Shoulder Flexion  4+/5    Left Shoulder ABduction  4/5    Left Shoulder Internal Rotation  4-/5    Left Shoulder External Rotation  4-/5      Palpation   Palpation comment  TTP over L thoracic paraspinals at level of T7-8                   [facility] Adult PT Treatment/Exercise - 09/21/19 0001      Self-Care   Self-Care  Other Self-Care Comments    Other Self-Care Comments   edu and practice on use of tennis ball for self massage to L thoracic paraspinals      Shoulder Exercises: Supine   Horizontal ABduction  Strengthening;Both;10 reps;Theraband    Theraband Level (Shoulder Horizontal ABduction)  Level 1 (Yellow)    Horizontal ABduction Limitations  cues for sligh  scap retraction   limited ROM   Other Supine Exercises  L shoulder IR/ER with 2# with shoulder abduction to 45 deg x10   painful popping in L anterior shoulder     Shoulder Exercises: Seated   Other Seated Exercises  thoracic prayer stretch with green pball x5 to tolerance      Shoulder Exercises: Standing   External Rotation  Strengthening;Right    Theraband Level (Shoulder External Rotation)  Level 2 (Red)    External Rotation Limitations  elbow by side, stopping at neutral    Internal Rotation  Left;10 reps;Theraband;Strengthening    Theraband Level (Shoulder Internal Rotation)  Level 2 (Red)    Internal Rotation Limitations  elbow by side, stopping at neutral    Other Standing Exercises  B shoulder ER with yellow TB x10    Other Standing Exercises  L shoulder overhead lift with 1, 2, 3, 4 lbs- 1 rep each   4/10 pain with 4lbs; good scapular mechanics     Shoulder Exercises: ROM/Strengthening   UBE (Upper Arm Bike)  Lvl 1.0, 3 min forwards/3 min backwards       Vasopneumatic   Number Minutes Vasopneumatic   15 minutes    Vasopnuematic Location   Shoulder   L   Vasopneumatic Pressure  Low    Vasopneumatic Temperature   coldest temp.             PT Education - 09/21/19 1704    Education Details  update to HEP; discussion on objective progress and remaining impairments    Person(s) Educated  Patient    Methods  Explanation;Demonstration;Tactile cues;Verbal cues;Handout    Comprehension  Verbalized understanding;Returned demonstration       PT Short Term Goals - 09/21/19 1617      PT SHORT TERM GOAL #1   Title  Patient to be independent with initial HEP.    Time  3    Period  Weeks    Status  Achieved    Target Date  09/15/19        PT Long Term Goals - 09/21/19 1617      PT LONG TERM GOAL #1   Title  Patient to be independent with advanced HEP.    Time  6    Period  Weeks    Status  Partially Met   met for current  PT LONG TERM GOAL #2   Title   Patient to demonstrate L shoulder strength >/=4+/5.    Time  6    Period  Weeks    Status  Partially Met   improved in L shoulder flexion and abduction, still most limited in IR/ER     PT LONG TERM GOAL #3   Title  Patient to demonstrate L shoulder AROM symmetrical to opposite UE and nonpainful.    Time  6    Period  Weeks    Status  On-going   improved in L shoulder flexion, abduction, and IR AROM     PT LONG TERM GOAL #4   Title  Patient to report 70% improvement in pain levels with putting on seatbelt and closing doors with L UE.    Time  7    Period  Weeks    Status  On-going   has been avoiding these activities d/t avoid pain     PT LONG TERM GOAL #5   Title  Patient to demonstrate overhead lift with L UE with 5lbs and without pain.    Time  6    Period  Weeks    Status  On-going   able to lift 4 lbs overhea with good scapular mechanics and 4/10 pain           Plan - 09/21/19 1845    Clinical Impression Statement  Patient reporting 15-20% improvement in L shoulder since initial eval. Today noting decreased pain posteriorly on the shoulder, more pain anteriorly and with new onset of L midback a couple days ago. Upon palpation, patient TTP and with slightly increased soft tissue restriction over thoracic paraspinals. Educated patient on use of self-massage and stretching to relieve this pain.  Patient has demonstrated improvement in L shoulder flexion, abduction, and IR AROM. Strength testing revealed improved L shoulder flexion and abduction, still most limited in IR/ER. Patient today able to lift 4 lbs overhead with good scapular mechanics, but with 4/10 pain. Worked on addressing patient's shoulder rotation weakness for remainder of appointment. Patient with uncomfortable popping in L shoulder with supine IR/ER, but with better tolerance of banded IR/ER with shoulder in neutral. Patient reported understanding of new HEP update. Ended session with [place] to L shoulder to  ease post-exercise soreness. No complaints at end of session. Patient overall showing good progress. Would continue to benefit from skilled PT services.    Comorbidities  migraines, lumbar pain, HTN, HLD, drug & alcohol abuse, depression, anxiety, asthma, umbilical hernia repair    Rehab Potential  Good    PT Treatment/Interventions  ADLs/Self Care Home Management;Cryotherapy;Electrical Stimulation;Iontophoresis '4mg'$ /ml Dexamethasone;Moist Heat;Therapeutic exercise;Therapeutic activities;Functional mobility training;Ultrasound;Neuromuscular re-education;Patient/family education;Manual techniques;Vasopneumatic Device;Taping;Energy conservation;Dry needling;Passive range of motion    PT Next Visit Plan  Progress AAROM, ROM, gentle strengthening    Consulted and Agree with Plan of Care  Patient       Patient will benefit from skilled therapeutic intervention in order to improve the following deficits and impairments:  Decreased activity tolerance, Decreased strength, Pain, Impaired UE functional use, Increased muscle spasms, Improper body mechanics, Decreased range of motion, Impaired flexibility, Postural dysfunction  Visit Diagnosis: Chronic left shoulder pain  Stiffness of left shoulder, not elsewhere classified  Muscle weakness (generalized)     Problem List Patient Active Problem List   Diagnosis Date Noted  . 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 MDD (major depressive disorder) 03/06/2017     Janene Harvey, PT, DPT 09/21/19 6:48 PM  [facility] [address]  [place], [place], [address] Phone: [phone]   Fax:  [phone]  Name: George Ward MRN: [id number] Date of Birth: 1971/02/25

## 2019-09-23 ENCOUNTER — Encounter: Payer: Self-pay | Admitting: Orthopaedic Surgery

## 2019-09-23 ENCOUNTER — Other Ambulatory Visit: Payer: Self-pay

## 2019-09-23 ENCOUNTER — Ambulatory Visit: Payer: BC Managed Care – PPO | Admitting: Orthopaedic Surgery

## 2019-09-23 DIAGNOSIS — M7542 Impingement syndrome of left shoulder: Secondary | ICD-10-CM

## 2019-09-23 DIAGNOSIS — M7532 Calcific tendinitis of left shoulder: Secondary | ICD-10-CM

## 2019-09-23 NOTE — Progress Notes (Signed)
The patient is well-known to me.  He is a very active 49 year old gentleman with chronic left shoulder pain.  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  We have tried multiple steroid injections in the subacromial space of his left shoulder.  He is worked on activity modification as well as taking anti-inflammatories.  He has rested his shoulder.  He has been through therapy as well.  At this point, conservative treatment has failed.  He is interested in arthroscopic intervention.  We have discussed this before.  He hurts with overhead activities and reaching behind him.  It has been waking up at night.  Even rest and time have failed to get his shoulder to improve on the left side.  A MRI was obtained earlier that did not show any rotator cuff tear but it did show tendinosis of the rotator cuff and evidence of impingement.  On exam today he still has significant pain with full forward flexion and abduction as well as internal rotation with adduction.  With reaching behind him with the left arm he can only reach to the mid lumbar spine where he can reach as much higher to the thoracic spine on his other side.  We talked in length about shoulder arthroscopic surgery.  I described in detail what this involves.  We discussed his interoperative and postoperative course.  We talked about the risk and benefits of surgery as well.  All question concerns were answered and addressed.  We will work on getting this scheduled in the near future and then would see him back in 1 week postoperative.

## 2019-09-24 DIAGNOSIS — F332 Major depressive disorder, recurrent severe without psychotic features: Secondary | ICD-10-CM | POA: Diagnosis not present

## 2019-09-28 ENCOUNTER — Ambulatory Visit: Payer: BC Managed Care – PPO | Admitting: Physical Therapy

## 2019-10-02 ENCOUNTER — Encounter: Payer: Self-pay | Admitting: Orthopaedic Surgery

## 2019-10-05 ENCOUNTER — Ambulatory Visit: Payer: BC Managed Care – PPO

## 2019-10-05 ENCOUNTER — Other Ambulatory Visit: Payer: Self-pay

## 2019-10-05 DIAGNOSIS — M25612 Stiffness of left shoulder, not elsewhere classified: Secondary | ICD-10-CM | POA: Diagnosis not present

## 2019-10-05 DIAGNOSIS — G8929 Other chronic pain: Secondary | ICD-10-CM | POA: Diagnosis not present

## 2019-10-05 DIAGNOSIS — M6281 Muscle weakness (generalized): Secondary | ICD-10-CM

## 2019-10-05 DIAGNOSIS — M25512 Pain in left shoulder: Secondary | ICD-10-CM | POA: Diagnosis not present

## 2019-10-05 NOTE — Therapy (Addendum)
[facility] [address]  [place], [place], [address] Phone: [phone]   Fax:  [phone]  Physical Therapy Treatment  Patient Details  Name: George Ward MRN: [id number] Date of Birth: 1970/10/19 Referring Provider (PT): Erskine Emery, PA-C   Encounter Date: 10/05/2019  PT End of Session - 10/05/19 1619    Visit Number  6    Number of Visits  7    Date for PT Re-Evaluation  10/06/19    Authorization Type  BCBS    PT Start Time  1615    PT Stop Time  1720    PT Time Calculation (min)  65 min    Activity Tolerance  Patient tolerated treatment well;Patient limited by pain    Behavior During Therapy  [facility] for tasks assessed/performed       Past Medical History:  Diagnosis Date  . Alcohol abuse    Remission >20 years ago  . Allergy   . Anxiety   . Asthma   . Depression    MAJOR DEPRESSIVE DISORDER  . Depression with anxiety   . Drug abuse ([place])    Remission >20 years ago  . Encounter for HIV (human immunodeficiency virus) test 2014-2015  . Environmental allergies   . History of chicken pox   . Hyperlipidemia   . Hypertension   . Hypoalphalipoproteinemia, familial   . Lumbar pain   . Migraines   . Primary generalized (osteo)arthritis   . Seasonal allergies     Past Surgical History:  Procedure Laterality Date  . EXCISION MASS HEAD  03/17/2018   excision cells on head  ; still in testing for cancer per patient report   . LUMBAR EPIDURAL INJECTION    . UMBILICAL HERNIA REPAIR N/A 04/02/2018   Procedure: OPEN UMBILICAL HERNIA REPAIR WITH INSERTION OF MESH;  Surgeon: Ileana Roup, MD;  Location: [facility];  Service: General;  Laterality: N/A;  . WISDOM TOOTH EXTRACTION  1989-1990    There were no vitals filed for this visit.  Subjective Assessment - 10/05/19 1622    Subjective  Pt. reporting he saw MD a few weeks ago and MD recommending arthroscopic surgery.  Notes shoulder pain is waking  him up multiple times a night when he roles over on L side.    Pertinent History  migraines, lumbar pain, HTN, HLD, drug & alcohol abuse, depression, anxiety, asthma, umbilical hernia repair    Diagnostic tests  10/03/2018 left shoulder MRI: low-grade articular surface tear involving supraspinatus tendon but no full-thickness cuff tear.  Subacromial subdeltoid bursal fluid consistent with bursitis was also seen.  Mild arthropathy of the [facility] joint.  Glenohumeral joint well-maintained.    Patient Stated Goals  avoid surgery    Currently in Pain?  Yes    Pain Score  3     Pain Location  Shoulder    Pain Orientation  Left    Pain Descriptors / Indicators  Sore    Pain Type  Chronic pain    Aggravating Factors   "turning arm", raising arm out to side    Multiple Pain Sites  No         [facility] PT Assessment - 10/05/19 0001      Observation/Other Assessments   Focus on Therapeutic Outcomes (FOTO)   59% (41% limitation)      AROM   AROM Assessment Site  Shoulder    Right/Left Shoulder  Left    Right Shoulder Flexion  168 Degrees    Right Shoulder ABduction  173 Degrees    Right Shoulder Internal Rotation  --   FIR to T8   Right Shoulder External Rotation  --   FER to T3   Left Shoulder Flexion  130 Degrees    Left Shoulder ABduction  97 Degrees    Left Shoulder Internal Rotation  --   FIR to 3" below your shoulder blades    Left Shoulder External Rotation  --   FER to T2     Strength   Strength Assessment Site  Shoulder    Right/Left Shoulder  Right;Left    Right Shoulder Flexion  4+/5    Right Shoulder ABduction  4+/5    Right Shoulder Internal Rotation  4+/5    Right Shoulder External Rotation  4+/5    Left Shoulder Flexion  4/5    Left Shoulder ABduction  4/5    Left Shoulder Internal Rotation  4/5    Left Shoulder External Rotation  4/5                   [facility] Adult PT Treatment/Exercise - 10/05/19 0001      Self-Care   Self-Care  Other Self-Care Comments     Other Self-Care Comments   Education on home tens unit and electrode placement and proper setup;  reviewed comprehensive home program to check for understanding       Shoulder Exercises: Standing   External Rotation  10 reps;Strengthening;Left    Theraband Level (Shoulder External Rotation)  Level 2 (Red)    Internal Rotation  Left;10 reps;Theraband;Strengthening    Theraband Level (Shoulder Internal Rotation)  Level 2 (Red)    Extension  15 reps;Both;Strengthening;Theraband    Theraband Level (Shoulder Extension)  Level 2 (Red)    Row  Both;15 reps;Strengthening;Theraband    Theraband Level (Shoulder Row)  Level 3 (Green)      Shoulder Exercises: ROM/Strengthening   UBE (Upper Arm Bike)  Lvl 1.0, 3 min forwards/3 min backwards       Shoulder Exercises: Stretch   Corner Stretch  2 reps;30 seconds    Corner Stretch Limitations  low in doorway              PT Education - 10/05/19 1746    Education Details  comprehensive HEP update with green TB issued to pt. for row;  TENS unit educational handout issued to pt. for electrode placement and general setup    Person(s) Educated  Patient    Methods  Explanation;Demonstration;Verbal cues;Handout    Comprehension  Verbalized understanding;Returned demonstration;Verbal cues required       PT Short Term Goals - 09/21/19 1617      PT SHORT TERM GOAL #1   Title  Patient to be independent with initial HEP.    Time  3    Period  Weeks    Status  Achieved    Target Date  09/15/19        PT Long Term Goals - 10/05/19 1711      PT LONG TERM GOAL #1   Title  Patient to be independent with advanced HEP.    Time  6    Period  Weeks    Status  Partially Met   met for current     PT LONG TERM GOAL #2   Title  Patient to demonstrate L shoulder strength >/=4+/5.    Time  6    Period  Weeks  Status  Partially Met   improved in L shoulder flexion and abduction, still most limited in IR/ER     PT LONG TERM GOAL #3   Title   Patient to demonstrate L shoulder AROM symmetrical to opposite UE and nonpainful.    Time  6    Period  Weeks    Status  Partially Met   improved in L shoulder flexion, abduction, and IR AROM     PT LONG TERM GOAL #4   Title  Patient to report 70% improvement in pain levels with putting on seatbelt and closing doors with L UE.    Time  7    Period  Weeks    Status  On-going   has been avoiding these activities d/t avoid pain     PT LONG TERM GOAL #5   Title  Patient to demonstrate overhead lift with L UE with 5lbs and without pain.    Time  6    Period  Weeks    Status  Partially Met   Able to lift 5lb dumbbell to shelf overhead however with 3/10 pain and with some visible scapular "hiking"           Plan - 10/05/19 1620    Clinical Impression Statement  Elta Guadeloupe reporting MD recommending surgery at recent f/u as Dezmen feels that he has made slow progress with conservative approach.  Pt. wishing to transition to home program after today with supervising PT approving this plan.  Pt. opting for 30-day hold from therapy.  Pt. has partially met all LTGs at this point with L shoulder AROM and strength nearly unchanged since when last addressed ~ 2 weeks ago in session. Session focused on review and updated of comprehensive HEP to check for understanding and tolerance to updated band resistance.  Pt. issued green TB for row and instructed to continue with red TB for standing IR, ER, extension.  Pt. verbalized understanding of comprehensive HEP and now on 30-day hold.    Comorbidities  migraines, lumbar pain, HTN, HLD, drug & alcohol abuse, depression, anxiety, asthma, umbilical hernia repair    Rehab Potential  Good    PT Treatment/Interventions  ADLs/Self Care Home Management;Cryotherapy;Electrical Stimulation;Iontophoresis '4mg'$ /ml Dexamethasone;Moist Heat;Therapeutic exercise;Therapeutic activities;Functional mobility training;Ultrasound;Neuromuscular re-education;Patient/family education;Manual  techniques;Vasopneumatic Device;Taping;Energy conservation;Dry needling;Passive range of motion    PT Next Visit Plan  30-day hold    Consulted and Agree with Plan of Care  Patient       Patient will benefit from skilled therapeutic intervention in order to improve the following deficits and impairments:  Decreased activity tolerance, Decreased strength, Pain, Impaired UE functional use, Increased muscle spasms, Improper body mechanics, Decreased range of motion, Impaired flexibility, Postural dysfunction  Visit Diagnosis: Chronic left shoulder pain  Stiffness of left shoulder, not elsewhere classified  Muscle weakness (generalized)     Problem List Patient Active Problem List   Diagnosis Date Noted  . 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 MDD (major depressive disorder) 03/06/2017    Bess Harvest, PTA 10/05/19 5:47 PM    [facility] [address]  [place], [place], [address] Phone: [phone]   Fax:  [phone]  Name: DHIREN AZIMI MRN: [id number] Date of Birth: [birth year]  PHYSICAL THERAPY DISCHARGE SUMMARY  Visits from Start of Care: 6  Current functional level related to goals / functional outcomes: See above clinical impression; patient did not return after 30 day hold   Remaining deficits:  Decreased ROM, strength, functional activity tolerance    Education / Equipment: HEP  Plan: Patient agrees to discharge.  Patient goals were partially met. Patient is being discharged due to the patient's request.  ?????     Janene Harvey, PT, DPT 11/18/19 3:20 PM

## 2019-10-05 NOTE — Patient Instructions (Signed)
TENS stands for Transcutaneous Electrical Nerve Stimulation. In other words, electrical impulses are allowed to pass through the skin in order to excite a nerve.  ° °Purpose and Use of TENS:  °TENS is a method used to manage acute and chronic pain without the use of drugs. It has been effective in managing pain associated with surgery, sprains, strains, trauma, rheumatoid arthritis, and neuralgias. It is a non-addictive, low risk, and non-invasive technique used to control pain. It is not, by any means, a curative form of treatment.  ° °How TENS Works:  °Most TENS units are a small pocket-sized unit powered by one 9 volt battery. Attached to the outside of the unit are two lead wires where two pins and/or snaps connect on each wire. All units come with a set of four reusable pads or electrodes. These are placed on the skin surrounding the area involved. By inserting the leads into  the pads, the electricity can pass from the unit making the circuit complete.  °As the intensity is turned up slowly, the electrical current enters the body from the electrodes through the skin to the surrounding nerve fibers. This triggers the release of hormones from within the body. These hormones contain pain relievers. By increasing the circulation of these hormones, the person’s pain may be lessened. It is also believed that the electrical stimulation itself helps to block the pain messages being sent to the brain, thus also decreasing the body’s perception of pain.  ° °Hazards:  °TENS units are NOT to be used by patients with PACEMAKERS, DEFIBRILLATORS, DIABETIC PUMPS, PREGNANT WOMEN, and patients with SEIZURE DISORDERS.  °TENS units are NOT to be used over the heart, throat, brain, or spinal cord.  °One of the major side effects from the TENS unit may be skin irritation. Some people may develop a rash if they are sensitive to the materials used in the electrodes or the connecting wires.  ° °Wear the unit for 15 min.  ° °Avoid  overuse due the body getting used to the stem making it not as effective over time.  ° °

## 2019-10-06 ENCOUNTER — Encounter: Payer: Self-pay | Admitting: Medical

## 2019-10-08 ENCOUNTER — Other Ambulatory Visit (HOSPITAL_BASED_OUTPATIENT_CLINIC_OR_DEPARTMENT_OTHER): Payer: Self-pay

## 2019-10-08 DIAGNOSIS — R0683 Snoring: Secondary | ICD-10-CM

## 2019-10-08 DIAGNOSIS — G4733 Obstructive sleep apnea (adult) (pediatric): Secondary | ICD-10-CM

## 2019-10-08 DIAGNOSIS — G4710 Hypersomnia, unspecified: Secondary | ICD-10-CM

## 2019-10-14 ENCOUNTER — Telehealth: Payer: Self-pay | Admitting: Medical

## 2019-10-14 DIAGNOSIS — E785 Hyperlipidemia, unspecified: Secondary | ICD-10-CM

## 2019-10-15 NOTE — Telephone Encounter (Signed)
George Ward -- Previous office note said pt was due for 3 month follow up in person. He says he can do any day after 3:45 but those are designated as Virtual Visits. Is it ok to bring him in for in person visit at 4pm one day?

## 2019-10-15 NOTE — Addendum Note (Signed)
Addended by: Gwenevere Abbot on: 10/15/2019 02:27 PM   Modules accepted: Orders

## 2019-10-15 NOTE — Telephone Encounter (Signed)
He can be scheduled llate but have him scheduled for labs earlier. That way if lab closes by the time I see him they will get done. Future fasting lipid panel and cmp placed.

## 2019-10-19 ENCOUNTER — Other Ambulatory Visit: Payer: Self-pay

## 2019-10-19 ENCOUNTER — Other Ambulatory Visit (INDEPENDENT_AMBULATORY_CARE_PROVIDER_SITE_OTHER): Payer: BC Managed Care – PPO

## 2019-10-19 DIAGNOSIS — E785 Hyperlipidemia, unspecified: Secondary | ICD-10-CM

## 2019-10-19 LAB — COMPREHENSIVE METABOLIC PANEL
ALT: 27 U/L (ref 0–53)
AST: 20 U/L (ref 0–37)
Albumin: 4.1 g/dL (ref 3.5–5.2)
Alkaline Phosphatase: 65 U/L (ref 39–117)
BUN: 21 mg/dL (ref 6–23)
CO2: 28 mEq/L (ref 19–32)
Calcium: 9 mg/dL (ref 8.4–10.5)
Chloride: 107 mEq/L (ref 96–112)
Creatinine, Ser: 1.12 mg/dL (ref 0.40–1.50)
GFR: 69.6 mL/min (ref >60.00)
Glucose, Bld: 90 mg/dL (ref 70–99)
Potassium: 3.8 mEq/L (ref 3.5–5.1)
Sodium: 142 mEq/L (ref 135–145)
Total Bilirubin: 0.3 mg/dL (ref 0.2–1.2)
Total Protein: 6.3 g/dL (ref 6.0–8.3)

## 2019-10-19 LAB — LIPID PANEL
Cholesterol: 155 mg/dL (ref 0–200)
HDL: 32.8 mg/dL — ABNORMAL LOW (ref >39.00)
Total CHOL/HDL Ratio: 5
Triglycerides: 411 mg/dL — ABNORMAL HIGH (ref 0.0–149.0)

## 2019-10-19 LAB — LDL CHOLESTEROL, DIRECT: Direct LDL: 81 mg/dL

## 2019-10-20 ENCOUNTER — Ambulatory Visit: Payer: BC Managed Care – PPO | Admitting: Medical

## 2019-10-20 VITALS — BP 126/82 | HR 76 | Temp 97.1°F | Resp 12 | Ht 69.0 in | Wt 211.8 lb

## 2019-10-20 DIAGNOSIS — E785 Hyperlipidemia, unspecified: Secondary | ICD-10-CM | POA: Diagnosis not present

## 2019-10-20 DIAGNOSIS — I10 Essential (primary) hypertension: Secondary | ICD-10-CM

## 2019-10-20 MED ORDER — FENOFIBRATE 67 MG PO CAPS: 67.0000 mg | ORAL_CAPSULE | Freq: Every day | ORAL | 11 refills | Status: DC

## 2019-10-20 NOTE — Patient Instructions (Addendum)
For htn, continue maxide.  For high cholesterol, recommend continue current atrorvastatin and will increase fenofibrate incrimentally. Higher dose potential side effects. Hopefully dose given today not too expensive. Recommend low cholesterol, low processed carbohydrate diet. Recommend start exercising daily.  On review pt mood is well controlled.   Follow up in 3 months or as needed

## 2019-10-20 NOTE — Progress Notes (Signed)
Subjective:    Patient ID: George Ward, male    DOB: [birth year], 49 y.o.   MRN: [id number]  HPI  Pt in for follow up.  Pt triglycerides were elevated worse than before.. Hdl mild decrease. Pt is on atorvastatin and fenofibrate. Pt states eating healthier in terms of less carbohydrates and less cholesterol. On statin and fenofibrate.  Pt admits not exercising.   Back in year 2018 very close to normal triglycerides. He was exercising back then.      Review of Systems  Constitutional: Negative for chills, fatigue and fever.  Respiratory: Negative for cough, chest tightness, shortness of breath and wheezing.   Cardiovascular: Negative for chest pain and palpitations.  Gastrointestinal: Negative for abdominal pain and blood in stool.  Genitourinary: Negative for dysuria.  Musculoskeletal: Negative for back pain and joint swelling.  Skin: Negative for rash.  Neurological: Negative for dizziness, syncope, weakness, numbness and headaches.  Hematological: Negative for adenopathy. Does not bruise/bleed easily.  Psychiatric/Behavioral: Negative for behavioral problems and confusion.    Past Medical History:  Diagnosis Date  . Alcohol abuse    Remission >20 years ago  . Allergy   . Anxiety   . Asthma   . Depression    MAJOR DEPRESSIVE DISORDER  . Depression with anxiety   . Drug abuse ([place])    Remission >20 years ago  . Encounter for HIV (human immunodeficiency virus) test 2014-2015  . Environmental allergies   . History of chicken pox   . Hyperlipidemia   . Hypertension   . Hypoalphalipoproteinemia, familial   . Lumbar pain   . Migraines   . Primary generalized (osteo)arthritis   . Seasonal allergies      Social History   Socioeconomic History  . Marital status: Married    Spouse name: Not on file  . Number of children: Not on file  . Years of education: Not on file  . Highest education level: Not on file  Occupational History  . Not on file  Tobacco Use    . Smoking status: Former Smoker    Types: Cigarettes    Quit date: 03/07/1988    Years since quitting: 31.6  . Smokeless tobacco: Former Network engineer and Sexual Activity  . Alcohol use: Not Currently  . Drug use: Not Currently  . Sexual activity: Yes  Other Topics Concern  . Not on file  Social History Narrative  . Not on file   Social Determinants of Health   Financial Resource Strain:   . Difficulty of Paying Living Expenses: Not on file  Food Insecurity:   . Worried About Charity fundraiser in the Last Year: Not on file  . Ran Out of Food in the Last Year: Not on file  Transportation Needs:   . Lack of Transportation (Medical): Not on file  . Lack of Transportation (Non-Medical): Not on file  Physical Activity:   . Days of Exercise per Week: Not on file  . Minutes of Exercise per Session: Not on file  Stress:   . Feeling of Stress : Not on file  Social Connections:   . Frequency of Communication with Friends and Family: Not on file  . Frequency of Social Gatherings with Friends and Family: Not on file  . Attends Religious Services: Not on file  . Active Member of Clubs or Organizations: Not on file  . Attends Archivist Meetings: Not on file  . Marital Status: Not on file  Intimate  Partner Violence:   . Fear of Current or Ex-Partner: Not on file  . Emotionally Abused: Not on file  . Physically Abused: Not on file  . Sexually Abused: Not on file    Past Surgical History:  Procedure Laterality Date  . EXCISION MASS HEAD  03/17/2018   excision cells on head  ; still in testing for cancer per patient report   . LUMBAR EPIDURAL INJECTION    . UMBILICAL HERNIA REPAIR N/A 04/02/2018   Procedure: OPEN UMBILICAL HERNIA REPAIR WITH INSERTION OF MESH;  Surgeon: Andria Meuse, MD;  Location: [facility];  Service: General;  Laterality: N/A;  . WISDOM TOOTH EXTRACTION  1989-1990    Family History  Problem Relation Age of Onset  . Hypertension Mother   .  Hyperlipidemia Father   . Healthy Sister   . Colon cancer Maternal Grandfather   . Prostate cancer Maternal Grandfather   . Congestive Heart Failure Maternal Grandfather   . Dementia Paternal Grandmother   . Heart disease Paternal Grandfather   . Heart attack Paternal Grandfather   . Hypertension Maternal Uncle   . Hyperlipidemia Maternal Uncle   . Hepatitis Paternal Aunt   . Hepatitis C Paternal Aunt   . Hypertension Paternal Uncle   . Hyperlipidemia Paternal Uncle   . Healthy Son        x1  . Healthy Daughter        x4    Allergies  Allergen Reactions  . Co-Trimoxazole [Sulfamethoxazole-Trimethoprim] Nausea And Vomiting  . Lisinopril Cough    With Wheezing   . Sulfa Antibiotics Nausea And Vomiting    Current Outpatient Medications on File Prior to Visit  Medication Sig Dispense Refill  . acetaminophen (TYLENOL) 500 MG tablet Take 1,000 mg by mouth daily as needed for moderate pain or headache.    . albuterol (PROVENTIL HFA;VENTOLIN HFA) 108 (90 Base) MCG/ACT inhaler Inhale 1-2 puffs into the lungs every 6 (six) hours as needed for wheezing or shortness of breath. Ventolin    . atorvastatin (LIPITOR) 40 MG tablet Take 1 tablet (40 mg total) by mouth daily. 90 tablet 1  . calcium carbonate (TUMS - DOSED IN MG ELEMENTAL CALCIUM) 500 MG chewable tablet Chew 2 tablets by mouth daily as needed for indigestion or heartburn.    . fenofibrate (TRICOR) 48 MG tablet TAKE 1 TABLET BY MOUTH EVERY DAY 90 tablet 1  . ibuprofen (ADVIL,MOTRIN) 200 MG tablet Take 400 mg by mouth daily as needed for headache or moderate pain.    Marland Kitchen loratadine (CLARITIN) 10 MG tablet Take 10 mg by mouth daily as needed for allergies.    Marland Kitchen LORazepam (ATIVAN) 0.5 MG tablet Take 1 tablet (0.5 mg total) by mouth 2 (two) times daily as needed for anxiety. 60 tablet 1  . Multiple Vitamin (MULTIVITAMIN WITH MINERALS) TABS tablet Take 1 tablet by mouth daily.    . SUMAtriptan (IMITREX) 50 MG tablet TAKE 1 TABLET BY  MOUTH AS NEEDED FOR MIGRAINE, MAY REPEAT IN 2 HOURS IF HEADACHE PERSISTS/RECURS 9 tablet 2  . triamterene-hydrochlorothiazide (MAXZIDE-25) 37.5-25 MG tablet TAKE 1 TABLET BY MOUTH EVERY DAY 90 tablet 1  . FLUoxetine (PROZAC) 40 MG capsule Take 2 capsules (80 mg total) by mouth daily. 180 capsule 1  . QUEtiapine (SEROQUEL) 50 MG tablet Take 2 tablets (100 mg total) by mouth at bedtime. 180 tablet 1  . zolpidem (AMBIEN) 10 MG tablet Take 1 tablet (10 mg total) by mouth at bedtime as needed for  sleep. 30 tablet 1   No current facility-administered medications on file prior to visit.    BP 126/82 (BP Location: Left Arm, Cuff Size: Normal)   Pulse 76   Temp (!) 97.1 F (36.2 C) (Temporal)   Resp 12   Ht 5\' 9"  (1.753 m)   Wt 211 lb 12.8 oz (96.1 kg)   SpO2 99%   BMI 31.28 kg/m       Objective:   Physical Exam   General Mental Status- Alert. General Appearance- Not in acute distress.   Skin General: Color- Normal Color. Moisture- Normal Moisture.  Neck Carotid Arteries- Normal color. Moisture- Normal Moisture. No carotid bruits. No JVD.  Chest and Lung Exam Auscultation: Breath Sounds:-Normal.  Cardiovascular Auscultation:Rythm- Regular. Murmurs & Other Heart Sounds:Auscultation of the heart reveals- No Murmurs.  Abdomen Inspection:-Inspeection Normal. Palpation/Percussion:Note:No mass. Palpation and Percussion of the abdomen reveal- Non Tender, Non Distended + BS, no rebound or guarding.    Neurologic Cranial Nerve exam:- CN III-XII intact(No nystagmus), symmetric smile. Strength:- 5/5 equal and symmetric strength both upper and lower extremities.     Assessment & Plan:  For htn, continue maxide.  For high cholesterol, recommend continue current atrorvastatin and will increase fenofibrate incrimentally. Higher dose potential side effects. Hopefully dose given today not too expensive. Recommend low cholesterol, low processed carbohydrate diet. Recommend start  exercising daily.  On review pt mood is well controlled.   Follow up in 3 months or as needed  20 minutes spent with pt today.

## 2019-10-22 ENCOUNTER — Other Ambulatory Visit: Payer: Self-pay | Admitting: Orthopaedic Surgery

## 2019-10-22 DIAGNOSIS — M7552 Bursitis of left shoulder: Secondary | ICD-10-CM | POA: Diagnosis not present

## 2019-10-22 DIAGNOSIS — M7582 Other shoulder lesions, left shoulder: Secondary | ICD-10-CM | POA: Diagnosis not present

## 2019-10-22 DIAGNOSIS — M7542 Impingement syndrome of left shoulder: Secondary | ICD-10-CM | POA: Diagnosis not present

## 2019-10-22 DIAGNOSIS — M24112 Other articular cartilage disorders, left shoulder: Secondary | ICD-10-CM | POA: Diagnosis not present

## 2019-10-22 MED ORDER — ONDANSETRON 4 MG PO TBDP: 4.0000 mg | ORAL_TABLET | Freq: Three times a day (TID) | ORAL | 0 refills | Status: DC | PRN

## 2019-10-22 MED ORDER — OXYCODONE HCL 5 MG PO TABS: 5.0000 mg | ORAL_TABLET | ORAL | 0 refills | Status: DC | PRN

## 2019-10-25 ENCOUNTER — Other Ambulatory Visit: Payer: Self-pay | Admitting: Medical

## 2019-10-26 NOTE — Telephone Encounter (Addendum)
Patient is requesting a refill of Atorvastatin and its not prescribed by [facility] , and I just want to know if this refill is appropriate .  Looks like in October I prescribed 40 mg tabs. 90 with one refill. So should not need refill presently. Did I sent that to wrong pharmacy in October wanted to increase dose from 20 mg to 40 mg.

## 2019-10-29 ENCOUNTER — Inpatient Hospital Stay: Payer: BC Managed Care – PPO | Admitting: Orthopaedic Surgery

## 2019-11-04 ENCOUNTER — Encounter: Payer: Self-pay | Admitting: Orthopaedic Surgery

## 2019-11-04 ENCOUNTER — Other Ambulatory Visit: Payer: Self-pay

## 2019-11-04 ENCOUNTER — Ambulatory Visit (INDEPENDENT_AMBULATORY_CARE_PROVIDER_SITE_OTHER): Payer: BC Managed Care – PPO | Admitting: Orthopaedic Surgery

## 2019-11-04 DIAGNOSIS — Z9889 Other specified postprocedural states: Secondary | ICD-10-CM

## 2019-11-04 NOTE — Progress Notes (Signed)
HPI: Mr. Lapinsky returns today 1 week status post left shoulder arthroscopy with extensive debridement SAD.  He states overall things are improving slightly each day.  He does note that he has difficulty with overhead motion and external rotation.  States arm initially felt heavy and tight but feels like it is loosening up.  Physical exam: Left shoulder port sites healing well no signs of infection.  Right shoulder has forward flexion to approximately 90 degrees.  Full cross over and limited external rotation.  Impression: 1 week status post left shoulder arthroscopy with extensive debridement and SA D  Plan: Suture removed.  He will work on scar tissue mobilization.  Work on range of motion strengthening shoulder.  He has Thera-Band at home and exercises from previous visits to physical therapy.  However if he feels the need to go to therapy because he is not getting his range of motion he will call our office we will send him to therapy at that time.  Otherwise we will see him back in 1 month check his range of motion strength.

## 2019-12-02 ENCOUNTER — Encounter: Payer: Self-pay | Admitting: Orthopaedic Surgery

## 2019-12-02 ENCOUNTER — Other Ambulatory Visit: Payer: Self-pay

## 2019-12-02 ENCOUNTER — Ambulatory Visit (INDEPENDENT_AMBULATORY_CARE_PROVIDER_SITE_OTHER): Payer: BC Managed Care – PPO | Admitting: Orthopaedic Surgery

## 2019-12-02 DIAGNOSIS — Z9889 Other specified postprocedural states: Secondary | ICD-10-CM

## 2019-12-02 NOTE — Progress Notes (Signed)
HPI: George Ward returns today 5 weeks status post left shoulder arthroscopy with extensive debridement and subacromial decompression.  He is overall doing well.  He feels that his range of motion strength are improving.  He will has problems with internal rotation.  Physical exam: Left shoulder 5 out of 5 strength with external and internal rotation against resistance.  Full forward flexion.  Full abduction.  External rotation slight limited compared to the right.  Impression: Status post left shoulder arthroscopy with extensive debridement and subacromial decompression.  Plan: Continue to work on range of motion strengthening.  He may benefit from subacromial injection in the future if he develops any pain.  Questions encouraged and answered.  Follow-up as needed.

## 2019-12-10 ENCOUNTER — Ambulatory Visit (HOSPITAL_BASED_OUTPATIENT_CLINIC_OR_DEPARTMENT_OTHER): Payer: BC Managed Care – PPO | Attending: Psychiatry | Admitting: Internal Medicine

## 2019-12-10 ENCOUNTER — Other Ambulatory Visit: Payer: Self-pay

## 2019-12-10 VITALS — Ht 70.0 in | Wt 200.0 lb

## 2019-12-10 DIAGNOSIS — G4710 Hypersomnia, unspecified: Secondary | ICD-10-CM | POA: Diagnosis not present

## 2019-12-10 DIAGNOSIS — G4733 Obstructive sleep apnea (adult) (pediatric): Secondary | ICD-10-CM | POA: Diagnosis not present

## 2019-12-10 DIAGNOSIS — R0683 Snoring: Secondary | ICD-10-CM | POA: Diagnosis not present

## 2019-12-24 ENCOUNTER — Other Ambulatory Visit: Payer: Self-pay | Admitting: Medical

## 2019-12-26 DIAGNOSIS — G4710 Hypersomnia, unspecified: Secondary | ICD-10-CM | POA: Diagnosis not present

## 2019-12-26 DIAGNOSIS — F332 Major depressive disorder, recurrent severe without psychotic features: Secondary | ICD-10-CM | POA: Diagnosis not present

## 2019-12-26 NOTE — Procedures (Signed)
   Patient Name: George Ward, George Ward Date: 12/10/2019 Gender: Male D.O.B: 1970/10/09 Age (years): 48 Referring Provider: Angus Palms Height (inches): 70 Interpreting Physician: Jetty Duhamel MD, ABSM Weight (lbs): 200 RPSGT: Elaina Pattee BMI: 29 MRN: [id number] Neck Size: 18.00  CLINICAL INFORMATION Sleep Study Type: HST Indication for sleep study: OSA Epworth Sleepiness Score: 4  SLEEP STUDY TECHNIQUE A multi-channel overnight portable sleep study was performed. The channels recorded were: nasal airflow, thoracic respiratory movement, and oxygen saturation with a pulse oximetry. Snoring was also monitored.  MEDICATIONS Patient self administered medications include: N/A.  SLEEP ARCHITECTURE Patient was studied for 372.7 minutes. The sleep efficiency was 100.0 % and the patient was supine for 3.1%. The arousal index was 0.0 per hour.  RESPIRATORY PARAMETERS The overall AHI was 2.9 per hour, with a central apnea index of 0.0 per hour. The oxygen nadir was 89% during sleep.  CARDIAC DATA Mean heart rate during sleep was 67.1 bpm.  IMPRESSIONS - No significant obstructive sleep apnea occurred during this study (AHI = 2.9/h). - No significant central sleep apnea occurred during this study (CAI = 0.0/h). - Mild oxygen desaturation was noted during this study (Min O2 = 89%). Mean sat 93% - Patient snored.  DIAGNOSIS - Normal study  RECOMMENDATIONS  - Sleep hygiene should be reviewed to assess factors that may improve sleep quality. - Weight management and regular exercise should be initiated or continued.  [Electronically signed] 12/26/2019 11:05 AM  Jetty Duhamel MD, ABSM Diplomate, American Board of Sleep Medicine   NPI: [id number]                          Jetty Duhamel Diplomate, American Board of Sleep Medicine  ELECTRONICALLY SIGNED ON:  12/26/2019, 11:03 AM [facility] PH: [phone]   FX: [phone]  [id number] ACCREDITED BY THE AMERICAN ACADEMY OF SLEEP MEDICINE

## 2020-01-11 ENCOUNTER — Other Ambulatory Visit: Payer: Self-pay | Admitting: Medical

## 2020-01-21 ENCOUNTER — Encounter: Payer: Self-pay | Admitting: Orthopaedic Surgery

## 2020-01-22 ENCOUNTER — Other Ambulatory Visit: Payer: Self-pay

## 2020-01-22 DIAGNOSIS — Z9889 Other specified postprocedural states: Secondary | ICD-10-CM

## 2020-01-27 ENCOUNTER — Ambulatory Visit (INDEPENDENT_AMBULATORY_CARE_PROVIDER_SITE_OTHER): Payer: BC Managed Care – PPO | Admitting: Physician Assistant

## 2020-01-27 ENCOUNTER — Other Ambulatory Visit: Payer: Self-pay

## 2020-01-27 ENCOUNTER — Encounter: Payer: Self-pay | Admitting: Physician Assistant

## 2020-01-27 DIAGNOSIS — M25512 Pain in left shoulder: Secondary | ICD-10-CM | POA: Diagnosis not present

## 2020-01-27 DIAGNOSIS — Z9889 Other specified postprocedural states: Secondary | ICD-10-CM | POA: Diagnosis not present

## 2020-01-27 MED ORDER — LIDOCAINE HCL 1 % IJ SOLN
3.0000 mL | INTRAMUSCULAR | Status: AC | PRN
  Administered 2020-01-27: 3 mL

## 2020-01-27 MED ORDER — METHYLPREDNISOLONE ACETATE 40 MG/ML IJ SUSP
40.0000 mg | INTRAMUSCULAR | Status: AC | PRN
  Administered 2020-01-27: 40 mg via INTRA_ARTICULAR

## 2020-01-27 NOTE — Progress Notes (Signed)
Office Visit Note   Patient: George Ward           Date of Birth: [birth year]           MRN: [id number] Visit Date: 01/27/2020              Requested by: Mackie Pai, PA-C [place],  [place] [id number] PCP: Mackie Pai, PA-C   Assessment & Plan: Visit Diagnoses:  1. S/P arthroscopy of left shoulder     Plan: We will send him back to physical therapy to work on range of motion strengthening of the left shoulder. Like to see him back in 3 weeks to see how he did with therapy and the injection. Questions were encouraged and answered.  Follow-Up Instructions: Return in about 3 weeks (around 02/17/2020).   Orders:  Orders Placed This Encounter  Procedures  . Large Joint Inj   No orders of the defined types were placed in this encounter.     Procedures: Large Joint Inj: L subacromial bursa on 01/27/2020 4:43 PM Indications: pain Details: 22 G 1.5 in needle, superior approach  Arthrogram: No  Medications: 3 mL lidocaine 1 %; 40 mg methylPREDNISolone acetate 40 MG/ML Outcome: tolerated well, no immediate complications Procedure, treatment alternatives, risks and benefits explained, specific risks discussed. Consent was given by the patient. Immediately prior to procedure a time out was called to verify the correct patient, procedure, equipment, support staff and site/side marked as required. Patient was prepped and draped in the usual sterile fashion.       Clinical Data: No additional findings.   Subjective: Chief Complaint  Patient presents with  . Left Shoulder - Pain    HPI [name] returns today due to increasing left shoulder pain. He was overall doing well and we last saw him in March and felt like he was progressing well. However about a month ago portable basketball goal was falling he tried to catch it and since that time has had worsening left shoulder pain. States that shoulder is very achy sharp pain especially with  overhead activity and his range of motion is diminished. No numbness tingling down the arm. He has tried some Motrin. He is now approximately 13 weeks status post left shoulder arthroscopy with extensive debridement and subacromial decompression. Review of Systems See HPI  Objective: Vital Signs: There were no vitals taken for this visit.  Physical Exam General: Well-developed well-nourished male no acute distress Psych: Alert and oriented x3 Ortho Exam Left shoulder forward flexion actively to about 100 degrees passively and bring 120 degrees. Has limited external rotation. 5 out of 5 strength though with external and internal rotation against resistance and empty can test is negative bilaterally.  Specialty Comments:  No specialty comments available.  Imaging: No results found.   PMFS History: Patient Active Problem List   Diagnosis Date Noted  . 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 MDD (major depressive disorder) 03/06/2017   Past Medical History:  Diagnosis Date  . Alcohol abuse    Remission >20 years ago  . Allergy   . Anxiety   . Asthma   . Depression    MAJOR DEPRESSIVE DISORDER  . Depression with anxiety   . Drug abuse ([place])    Remission >20 years ago  . Encounter for HIV (human immunodeficiency virus) test 2014-2015  . Environmental allergies   . History of chicken pox   . Hyperlipidemia   . Hypertension   .  Hypoalphalipoproteinemia, familial   . Lumbar pain   . Migraines   . Primary generalized (osteo)arthritis   . Seasonal allergies     Family History  Problem Relation Age of Onset  . Hypertension Mother   . Hyperlipidemia Father   . Healthy Sister   . Colon cancer Maternal Grandfather   . Prostate cancer Maternal Grandfather   . Congestive Heart Failure Maternal Grandfather   . Dementia Paternal Grandmother   . Heart disease Paternal Grandfather   . Heart attack Paternal Grandfather   . Hypertension Maternal Uncle   . Hyperlipidemia  Maternal Uncle   . Hepatitis Paternal Aunt   . Hepatitis C Paternal Aunt   . Hypertension Paternal Uncle   . Hyperlipidemia Paternal Uncle   . Healthy Son        x1  . Healthy Daughter        x4    Past Surgical History:  Procedure Laterality Date  . EXCISION MASS HEAD  03/17/2018   excision cells on head  ; still in testing for cancer per patient report   . LUMBAR EPIDURAL INJECTION    . UMBILICAL HERNIA REPAIR N/A 04/02/2018   Procedure: OPEN UMBILICAL HERNIA REPAIR WITH INSERTION OF MESH;  Surgeon: Andria Meuse, MD;  Location: [facility];  Service: General;  Laterality: N/A;  . WISDOM TOOTH EXTRACTION  1989-1990   Social History   Occupational History  . Not on file  Tobacco Use  . Smoking status: Former Smoker    Types: Cigarettes    Quit date: 03/07/1988    Years since quitting: 31.9  . Smokeless tobacco: Former Engineer, water and Sexual Activity  . Alcohol use: Not Currently  . Drug use: Not Currently  . Sexual activity: Yes

## 2020-01-28 ENCOUNTER — Other Ambulatory Visit: Payer: Self-pay | Admitting: Radiology

## 2020-01-28 DIAGNOSIS — Z9889 Other specified postprocedural states: Secondary | ICD-10-CM

## 2020-02-09 ENCOUNTER — Encounter: Payer: Self-pay | Admitting: Physical Therapy

## 2020-02-09 ENCOUNTER — Other Ambulatory Visit: Payer: Self-pay

## 2020-02-09 ENCOUNTER — Ambulatory Visit: Payer: BC Managed Care – PPO | Attending: Physician Assistant | Admitting: Physical Therapy

## 2020-02-09 DIAGNOSIS — M25612 Stiffness of left shoulder, not elsewhere classified: Secondary | ICD-10-CM | POA: Insufficient documentation

## 2020-02-09 DIAGNOSIS — M6281 Muscle weakness (generalized): Secondary | ICD-10-CM | POA: Insufficient documentation

## 2020-02-09 DIAGNOSIS — G8929 Other chronic pain: Secondary | ICD-10-CM | POA: Insufficient documentation

## 2020-02-09 DIAGNOSIS — M25512 Pain in left shoulder: Secondary | ICD-10-CM | POA: Insufficient documentation

## 2020-02-09 NOTE — Therapy (Signed)
[facility] [address]  [address] [place], [place], [address] Phone: [phone]   Fax:  [phone]  Physical Therapy Evaluation  Patient Details  Name: George Ward MRN: [id number] Date of Birth: 1970/11/17 Referring Provider (PT): Richardean Canal, PA-C   Encounter Date: 02/09/2020  PT End of Session - 02/09/20 1518    Visit Number  1    Number of Visits  7    Date for PT Re-Evaluation  03/22/20    Authorization Type  BC[employer]    PT Start Time  1439    PT Stop Time  1512    PT Time Calculation (min)  33 min    Activity Tolerance  Patient tolerated treatment well;Patient limited by pain    Behavior During Therapy  [facility] for tasks assessed/performed       Past Medical History:  Diagnosis Date  . Alcohol abuse    Remission >20 years ago  . Allergy   . Anxiety   . Asthma   . Depression    MAJOR DEPRESSIVE DISORDER  . Depression with anxiety   . Drug abuse (HCC)    Remission >20 years ago  . Encounter for HIV (human immunodeficiency virus) test 2014-2015  . Environmental allergies   . History of chicken pox   . Hyperlipidemia   . Hypertension   . Hypoalphalipoproteinemia, familial   . Lumbar pain   . Migraines   . Primary generalized (osteo)arthritis   . Seasonal allergies     Past Surgical History:  Procedure Laterality Date  . EXCISION MASS HEAD  03/17/2018   excision cells on head  ; still in testing for cancer per patient report   . LUMBAR EPIDURAL INJECTION    . UMBILICAL HERNIA REPAIR N/A 04/02/2018   Procedure: OPEN UMBILICAL HERNIA REPAIR WITH INSERTION OF MESH;  Surgeon: Andria Meuse, MD;  Location: [facility];  Service: General;  Laterality: N/A;  . WISDOM TOOTH EXTRACTION  1989-1990    There were no vitals filed for this visit.   Subjective Assessment - 02/09/20 1440    Subjective  Patient reports that in mid-February he had a L shoulder arthroscopy. Felt that he was recovering well from  this surgery until April, when he tried to catch a falling basketball hoop as it pushed his arm back into ER. Now noting a decrease in mobility and pain with basic movements. Pain is located over L posterior shoulder with intermittent radiation down the arm. Was given a cortisone injection without any benefit. Worse with reaching behind to close a door, don/doff shirts, reach overhead.    Pertinent History  migraines, lumbar pain, HTN, HLD, drug & alcohol abuse, depression, anxiety, asthma, umbilical hernia repair    Limitations  Lifting;House hold activities;Writing    Diagnostic tests  none recent    Patient Stated Goals  improve pain    Currently in Pain?  Yes    Pain Score  3     Pain Location  Shoulder    Pain Orientation  Posterior    Pain Descriptors / Indicators  Heaviness    Pain Type  Acute pain         [facility] PT Assessment - 02/09/20 1447      Assessment   Medical Diagnosis  s/p arthroscopy of L shoulder    Referring Provider (PT)  Richardean Canal, PA-C    Onset Date/Surgical Date  --   L shoulder arthroscopy mid February   Hand  Dominance  Left;Right    Next MD Visit  02/17/20    Prior Therapy  yes- pre-op      Precautions   Precautions  None      Balance Screen   Has the patient fallen in the past 6 months  No    Has the patient had a decrease in activity level because of a fear of falling?   No    Is the patient reluctant to leave their home because of a fear of falling?   No      Home Environment   Living Environment  Private residence    Living Arrangements  Spouse/significant other;Children    Available Help at Discharge  Family;Friend(s)    Type of Home  House      Prior Function   Level of Independence  Independent    Vocation  Full time employment      Cognition   Overall Cognitive Status  Within Functional Limits for tasks assessed      Sensation   Light Touch  Appears Intact      Coordination   Gross Motor Movements are Fluid and Coordinated  Yes       Posture/Postural Control   Posture/Postural Control  Postural limitations    Postural Limitations  Rounded Shoulders;Forward head      ROM / Strength   AROM / PROM / Strength  AROM;Strength      AROM   AROM Assessment Site  Shoulder    Right/Left Shoulder  Right;Left    Right Shoulder Flexion  170 Degrees    Right Shoulder ABduction  170 Degrees    Right Shoulder Internal Rotation  --   FIR T7   Right Shoulder External Rotation  --   FER T3   Left Shoulder Flexion  109 Degrees   severe pain   Left Shoulder ABduction  61 Degrees   severe pain   Left Shoulder Internal Rotation  --   FIR L4; severe pain   Left Shoulder External Rotation  --   FER C6; severe pain     Strength   Strength Assessment Site  Shoulder    Right/Left Shoulder  Right;Left    Right Shoulder Flexion  4+/5    Right Shoulder ABduction  4+/5    Right Shoulder Internal Rotation  4+/5    Right Shoulder External Rotation  4+/5    Left Shoulder Flexion  3+/5    Left Shoulder ABduction  2+/5    Left Shoulder Internal Rotation  2+/5   tested with shoulder in neutral   Left Shoulder External Rotation  2+/5   tested with shoulder in neutral     Palpation   Palpation comment  TTP over L infra/teres insertion and proximal biceps tendon                  Objective measurements completed on examination: See above findings.              PT Education - 02/09/20 1517    Education Details  prognosis, POC, HEP    Person(s) Educated  Patient    Methods  Explanation;Demonstration;Tactile cues;Verbal cues;Handout    Comprehension  Verbalized understanding;Returned demonstration       PT Short Term Goals - 02/09/20 1522      PT SHORT TERM GOAL #1   Title  Patient to be independent with initial HEP.    Time  3    Period  Weeks    Status  New    Target Date  03/01/20        PT Long Term Goals - 02/09/20 1523      PT LONG TERM GOAL #1   Title  Patient to be independent with  advanced HEP.    Time  6    Period  Weeks    Status  New    Target Date  03/22/20      PT LONG TERM GOAL #2   Title  Patient to demonstrate L shoulder strength >/=4+/5.    Time  6    Period  Weeks    Status  New    Target Date  03/22/20      PT LONG TERM GOAL #3   Title  Patient to demonstrate L shoulder AROM symmetrical to opposite UE and nonpainful.    Time  6    Period  Weeks    Status  New    Target Date  03/22/20      PT LONG TERM GOAL #4   Title  Patient to report 70% improvement in pain levels with dressing reaching overhead.    Time  6    Period  Weeks    Status  New    Target Date  03/22/20      PT LONG TERM GOAL #5   Title  Patient to demonstrate overhead lift with L UE with 5lbs and without pain.    Time  6    Period  Weeks    Status  New    Target Date  03/22/20             Plan - 02/09/20 1518    Clinical Impression Statement  Patient is a 49y/o M presenting to O[facility] with c/o acute L shoulder pain after reaching to catch a falling basketball hoop sometime in April 2021. Patient unsure of exact date of surgery, but underwent L shoulder arthroscopy with extensive debridement and subacromial decompression in mid-February. Pain is located over the L posterior shoulder with intermittent radiation down the arm. Worse with reaching behind to close a door, donning/doffing shirts, and reaching overhead. Patient today presenting with rounded shoulders and forward head posture, limited and severely painful L shoulder AROM, marked L shoulder weakness, and TTP over L infraspinatus/teres insertion and proximal biceps tendon. Patient educated on gentle ROM HEP- patient reported understanding. Would benefit from skilled PT services 1x/week for 6 weeks to address aforementioned impairments.    Personal Factors and Comorbidities  Age;Sex;Comorbidity 3+;Fitness;Past/Current Experience;Profession;Time since onset of injury/illness/exacerbation    Comorbidities  migraines, lumbar  pain, HTN, HLD, drug & alcohol abuse, depression, anxiety, asthma, umbilical hernia repair    Examination-Activity Limitations  Sleep;Caring for Others;Carry;Dressing;Hygiene/Grooming;Lift;Reach Overhead;Self Feeding    Examination-Participation Restrictions  Cleaning;Shop;Community Activity;Driving;Yard Work;Laundry;Meal Prep    Stability/Clinical Decision Making  Stable/Uncomplicated    Clinical Decision Making  Low    Rehab Potential  Good    PT Frequency  1x / week    PT Duration  6 weeks    PT Treatment/Interventions  ADLs/Self Care Home Management;Cryotherapy;Electrical Stimulation;Iontophoresis 4mg /ml Dexamethasone;Moist Heat;Therapeutic exercise;Therapeutic activities;Functional mobility training;Ultrasound;Neuromuscular re-education;Patient/family education;Manual techniques;Vasopneumatic Device;Taping;Energy conservation;Dry needling;Passive range of motion;Scar mobilization    PT Next Visit Plan  shoulder FOTO; reassess HEP, PROM, AAROM, AROM to tolerance; gameready, RTC isometrics, periscapular strengthening    Consulted and Agree with Plan of Care  Patient       Patient will benefit from skilled therapeutic intervention in order to improve the following deficits and impairments:  Increased edema, Decreased activity tolerance, Decreased strength, Increased fascial restricitons, Pain, Impaired UE functional use, Increased muscle spasms, Improper body mechanics, Decreased range of motion, Impaired flexibility, Postural dysfunction  Visit Diagnosis: Acute pain of left shoulder  Stiffness of left shoulder, not elsewhere classified  Muscle weakness (generalized)     Problem List Patient Active Problem List   Diagnosis Date Noted  . 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 MDD (major depressive disorder) 03/06/2017     Anette Guarneri, PT, DPT 02/09/20 3:25 PM   [facility] [address]  [address] [place], [place], [address] Phone: [phone]   Fax:  [phone]  Name: George Ward MRN: [id number] Date of Birth: May 28, 1971

## 2020-02-11 ENCOUNTER — Other Ambulatory Visit: Payer: Self-pay | Admitting: Medical

## 2020-02-15 ENCOUNTER — Ambulatory Visit: Payer: BC Managed Care – PPO

## 2020-02-16 ENCOUNTER — Other Ambulatory Visit: Payer: Self-pay

## 2020-02-16 ENCOUNTER — Encounter: Payer: Self-pay | Admitting: Medical

## 2020-02-16 ENCOUNTER — Ambulatory Visit: Payer: BC Managed Care – PPO

## 2020-02-16 DIAGNOSIS — M25512 Pain in left shoulder: Secondary | ICD-10-CM | POA: Diagnosis not present

## 2020-02-16 DIAGNOSIS — M25612 Stiffness of left shoulder, not elsewhere classified: Secondary | ICD-10-CM

## 2020-02-16 DIAGNOSIS — M6281 Muscle weakness (generalized): Secondary | ICD-10-CM

## 2020-02-16 NOTE — Therapy (Addendum)
[facility] [address]  [place], [place], [address] Phone: [phone]   Fax:  [phone]  Physical Therapy Treatment  Patient Details  Name: George Ward MRN: [id number] Date of Birth: 06-11-1971 Referring Provider (PT): Erskine Emery, PA-C   Encounter Date: 02/16/2020  PT End of Session - 02/16/20 1411    Visit Number  2    Number of Visits  7    Date for PT Re-Evaluation  03/22/20    Authorization Type  BCBS    PT Start Time  2694    PT Stop Time  1445    PT Time Calculation (min)  40 min    Activity Tolerance  Patient tolerated treatment well;Patient limited by pain    Behavior During Therapy  [facility] for tasks assessed/performed       Past Medical History:  Diagnosis Date  . Alcohol abuse    Remission >20 years ago  . Allergy   . Anxiety   . Asthma   . Depression    MAJOR DEPRESSIVE DISORDER  . Depression with anxiety   . Drug abuse ([place])    Remission >20 years ago  . Encounter for HIV (human immunodeficiency virus) test 2014-2015  . Environmental allergies   . History of chicken pox   . Hyperlipidemia   . Hypertension   . Hypoalphalipoproteinemia, familial   . Lumbar pain   . Migraines   . Primary generalized (osteo)arthritis   . Seasonal allergies     Past Surgical History:  Procedure Laterality Date  . EXCISION MASS HEAD  03/17/2018   excision cells on head  ; still in testing for cancer per patient report   . LUMBAR EPIDURAL INJECTION    . UMBILICAL HERNIA REPAIR N/A 04/02/2018   Procedure: OPEN UMBILICAL HERNIA REPAIR WITH INSERTION OF MESH;  Surgeon: Ileana Roup, MD;  Location: [facility];  Service: General;  Laterality: N/A;  . WISDOM TOOTH EXTRACTION  1989-1990    There were no vitals filed for this visit.  Subjective Assessment - 02/16/20 1407    Subjective  Doing ok.  Having some pain with HEP.    Pertinent History  migraines, lumbar pain, HTN, HLD, drug & alcohol  abuse, depression, anxiety, asthma, umbilical hernia repair    Diagnostic tests  none recent    Patient Stated Goals  improve pain    Currently in Pain?  Yes    Pain Score  4     Pain Location  Shoulder    Pain Orientation  Posterior;Left    Pain Type  Acute pain         [facility] PT Assessment - 02/16/20 0001      Observation/Other Assessments   Focus on Therapeutic Outcomes (FOTO)   shoulder 44% (56% limitation)                    [facility] Adult PT Treatment/Exercise - 02/16/20 0001      Shoulder Exercises: Supine   External Rotation  Left;15 reps;AAROM    External Rotation Limitations  wand - pillow under elbow     Flexion  Left;10 reps;AAROM    Flexion Limitations  wand; to tolerance     ABduction  Left;10 reps;AAROM    ABduction Limitations  wand; to tolerance       Shoulder Exercises: Sidelying   External Rotation  Left;10 reps;AROM    External Rotation Limitations  pillow under elbow  ABduction  Left;10 reps;Strengthening;AROM      Shoulder Exercises: Pulleys   Flexion  3 minutes    Flexion Limitations  to tolerance     Scaption  3 minutes    Scaption Limitations  to tolerance       Vasopneumatic   Number Minutes Vasopneumatic   10 minutes    Vasopnuematic Location   Shoulder   L    Vasopneumatic Pressure  Low    Vasopneumatic Temperature   coldest temp.      Manual Therapy   Manual Therapy  Soft tissue mobilization;Myofascial release    Manual therapy comments  sidelying     Soft tissue mobilization  STM to L posterior/inferior shoulder, posterior, lateral, anterior deltoid, L proximal biceps     Myofascial Release  TPR to L proximal biceps, L lateral biceps, L teres minor               PT Short Term Goals - 02/16/20 1411      PT SHORT TERM GOAL #1   Title  Patient to be independent with initial HEP.    Time  3    Period  Weeks    Status  On-going    Target Date  03/01/20        PT Long Term Goals - 02/16/20 1411      PT  LONG TERM GOAL #1   Title  Patient to be independent with advanced HEP.    Time  6    Period  Weeks    Status  On-going      PT LONG TERM GOAL #2   Title  Patient to demonstrate L shoulder strength >/=4+/5.    Time  6    Period  Weeks    Status  On-going      PT LONG TERM GOAL #3   Title  Patient to demonstrate L shoulder AROM symmetrical to opposite UE and nonpainful.    Time  6    Period  Weeks    Status  On-going      PT LONG TERM GOAL #4   Title  Patient to report 70% improvement in pain levels with dressing reaching overhead.    Time  6    Period  Weeks    Status  On-going      PT LONG TERM GOAL #5   Title  Patient to demonstrate overhead lift with L UE with 5lbs and without pain.    Time  6    Period  Weeks    Status  On-going            Plan - 02/16/20 1413    Clinical Impression Statement  [place] noting some pain with HEP.  Required minor change in positioning to reduce ER positioning with sidelying abduction for improved comfort.  Pt. noting he sees orthopedist tomorrow.  Tolerated all supine ER, abduction, flexion AAROM activities well today however does still demo very limited ROM in all planes.  Pt. with report of ongoing L shoulder weakness/pain when trying to raise arm to shoulder height or above.  Ended session with ice/compression to L shoulder to reduce post-exercise soreness and pain.    Comorbidities  migraines, lumbar pain, HTN, HLD, drug & alcohol abuse, depression, anxiety, asthma, umbilical hernia repair    Rehab Potential  Good    PT Frequency  1x / week    PT Treatment/Interventions  ADLs/Self Care Home Management;Cryotherapy;Electrical Stimulation;Iontophoresis '4mg'$ /ml Dexamethasone;Moist Heat;Therapeutic exercise;Therapeutic activities;Functional mobility training;Ultrasound;Neuromuscular re-education;Patient/family  education;Manual techniques;Vasopneumatic Device;Taping;Energy conservation;Dry needling;Passive range of motion;Scar mobilization    PT  Next Visit Plan  PROM, AAROM, AROM to tolerance; gameready, RTC isometrics, periscapular strengthening    Consulted and Agree with Plan of Care  Patient       Patient will benefit from skilled therapeutic intervention in order to improve the following deficits and impairments:  Increased edema, Decreased activity tolerance, Decreased strength, Increased fascial restricitons, Pain, Impaired UE functional use, Increased muscle spasms, Improper body mechanics, Decreased range of motion, Impaired flexibility, Postural dysfunction  Visit Diagnosis: Acute pain of left shoulder  Stiffness of left shoulder, not elsewhere classified  Muscle weakness (generalized)  Chronic left shoulder pain     Problem List Patient Active Problem List   Diagnosis Date Noted  . 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 MDD (major depressive disorder) 03/06/2017    Bess Harvest, PTA 02/16/20 3:14 PM   [facility] [address]  [place], [place], [address] Phone: [phone]   Fax:  [phone]  Name: KISHAUN EREKSON MRN: [id number] Date of Birth: 21-May-1971   PHYSICAL THERAPY DISCHARGE SUMMARY  Visits from Start of Care: 2  Current functional level related to goals / functional outcomes: Unable to assess    Remaining deficits: Unable to assess   Education / Equipment: HEP  Plan: Patient agrees to discharge.  Patient goals were not met. Patient is being discharged due to not returning since the last visit.  ?????      Janene Harvey, PT, DPT 04/25/20 11:31 AM

## 2020-02-17 ENCOUNTER — Encounter: Payer: Self-pay | Admitting: Family

## 2020-02-17 ENCOUNTER — Telehealth: Payer: Self-pay | Admitting: Family

## 2020-02-17 ENCOUNTER — Encounter: Payer: Self-pay | Admitting: Physician Assistant

## 2020-02-17 ENCOUNTER — Ambulatory Visit: Payer: BC Managed Care – PPO | Admitting: Physician Assistant

## 2020-02-17 ENCOUNTER — Ambulatory Visit: Payer: BC Managed Care – PPO | Admitting: Family

## 2020-02-17 VITALS — BP 127/98 | HR 90 | Temp 97.5°F | Resp 16 | Ht 70.0 in | Wt 203.0 lb

## 2020-02-17 VITALS — Ht 70.0 in | Wt 200.0 lb

## 2020-02-17 DIAGNOSIS — M25512 Pain in left shoulder: Secondary | ICD-10-CM | POA: Diagnosis not present

## 2020-02-17 DIAGNOSIS — G8929 Other chronic pain: Secondary | ICD-10-CM | POA: Diagnosis not present

## 2020-02-17 DIAGNOSIS — I10 Essential (primary) hypertension: Secondary | ICD-10-CM

## 2020-02-17 DIAGNOSIS — H00012 Hordeolum externum right lower eyelid: Secondary | ICD-10-CM

## 2020-02-17 MED ORDER — CEPHALEXIN 500 MG PO CAPS
500.0000 mg | ORAL_CAPSULE | Freq: Three times a day (TID) | ORAL | 0 refills | Status: DC
Start: 2020-02-17 — End: 2021-03-08

## 2020-02-17 NOTE — Progress Notes (Signed)
HPI: George Ward returns today follow-up of his left shoulder pain.  He had injection on 01/27/2020 and has gone to physical therapy with no relief.  Again he underwent a left shoulder arthroscopy with extensive debridement and SA D back February 2021 was doing well until he had a portable basketball goal that was falling he tried to catch it since then he has had increasing pain in the shoulder despite conservative treatment.  Denies any numbness tingling down the arm.  Occasionally does feel that he has pain that radiates down the arm with certain movements.  Has tried ibuprofen Motrin out any real relief.   Review of systems: No fevers chills.  Please see HPI.  Physical exam: Left shoulder forward flexion to approximately 110 degrees passively 120 degrees.  Has limited external rotation.  5 out of 5 strength with external and internal rotation against resistance liftoff test is positive on the right negative on the left.  Impingement testing positive on the left.  Good range of motion cervical spine without pain.  No tenderness medial border of the left scapula.  Impression: Left shoulder pain  Plan: We will have him undergo an MRI of the left shoulder rule out rotator cuff tear.  Have him follow-up after the MRI to go over results and discuss further treatment.  Questions encouraged and answered.

## 2020-02-17 NOTE — Progress Notes (Signed)
Subjective:    Patient ID: George Ward, male    DOB: May 13, 1971, 49 y.o.   MRN: [id number]  HPI  Patient is a 49 yr old male who presents today with chief complaint of cyst on his lower right eyelid. Cyst has been present for 4 days. Reports that he has been applying warm compresses, "but it just keeps getting worse."     Review of Systems See HPI  Past Medical History:  Diagnosis Date  . Alcohol abuse    Remission >20 years ago  . Allergy   . Anxiety   . Asthma   . Depression    MAJOR DEPRESSIVE DISORDER  . Depression with anxiety   . Drug abuse (HCC)    Remission >20 years ago  . Encounter for HIV (human immunodeficiency virus) test 2014-2015  . Environmental allergies   . History of chicken pox   . Hyperlipidemia   . Hypertension   . Hypoalphalipoproteinemia, familial   . Lumbar pain   . Migraines   . Primary generalized (osteo)arthritis   . Seasonal allergies      Social History   Socioeconomic History  . Marital status: Married    Spouse name: Not on file  . Number of children: Not on file  . Years of education: Not on file  . Highest education level: Not on file  Occupational History  . Not on file  Tobacco Use  . Smoking status: Former Smoker    Types: Cigarettes    Quit date: 03/07/1988    Years since quitting: 31.9  . Smokeless tobacco: Former Engineer, water and Sexual Activity  . Alcohol use: Not Currently  . Drug use: Not Currently  . Sexual activity: Yes  Other Topics Concern  . Not on file  Social History Narrative  . Not on file   Social Determinants of Health   Financial Resource Strain:   . Difficulty of Paying Living Expenses:   Food Insecurity:   . Worried About Programme researcher, broadcasting/film/video in the Last Year:   . Barista in the Last Year:   Transportation Needs:   . Freight forwarder (Medical):   Marland Kitchen Lack of Transportation (Non-Medical):   Physical Activity:   . Days of Exercise per Week:   . Minutes of Exercise per  Session:   Stress:   . Feeling of Stress :   Social Connections:   . Frequency of Communication with Friends and Family:   . Frequency of Social Gatherings with Friends and Family:   . Attends Religious Services:   . Active Member of Clubs or Organizations:   . Attends Banker Meetings:   Marland Kitchen Marital Status:   Intimate Partner Violence:   . Fear of Current or Ex-Partner:   . Emotionally Abused:   Marland Kitchen Physically Abused:   . Sexually Abused:     Past Surgical History:  Procedure Laterality Date  . EXCISION MASS HEAD  03/17/2018   excision cells on head  ; still in testing for cancer per patient report   . LUMBAR EPIDURAL INJECTION    . UMBILICAL HERNIA REPAIR N/A 04/02/2018   Procedure: OPEN UMBILICAL HERNIA REPAIR WITH INSERTION OF MESH;  Surgeon: Andria Meuse, MD;  Location: [facility];  Service: General;  Laterality: N/A;  . WISDOM TOOTH EXTRACTION  1989-1990    Family History  Problem Relation Age of Onset  . Hypertension Mother   . Hyperlipidemia Father   . Healthy Sister   .  Colon cancer Maternal Grandfather   . Prostate cancer Maternal Grandfather   . Congestive Heart Failure Maternal Grandfather   . Dementia Paternal Grandmother   . Heart disease Paternal Grandfather   . Heart attack Paternal Grandfather   . Hypertension Maternal Uncle   . Hyperlipidemia Maternal Uncle   . Hepatitis Paternal Aunt   . Hepatitis C Paternal Aunt   . Hypertension Paternal Uncle   . Hyperlipidemia Paternal Uncle   . Healthy Son        x1  . Healthy Daughter        x4    Allergies  Allergen Reactions  . Co-Trimoxazole [Sulfamethoxazole-Trimethoprim] Nausea And Vomiting  . Lisinopril Cough    With Wheezing   . Sulfa Antibiotics Nausea And Vomiting    Current Outpatient Medications on File Prior to Visit  Medication Sig Dispense Refill  . acetaminophen (TYLENOL) 500 MG tablet Take 1,000 mg by mouth daily as needed for moderate pain or headache.    . albuterol  (PROVENTIL HFA;VENTOLIN HFA) 108 (90 Base) MCG/ACT inhaler Inhale 1-2 puffs into the lungs every 6 (six) hours as needed for wheezing or shortness of breath. Ventolin    . atorvastatin (LIPITOR) 40 MG tablet TAKE 1 TABLET BY MOUTH EVERY DAY 90 tablet 1  . calcium carbonate (TUMS - DOSED IN MG ELEMENTAL CALCIUM) 500 MG chewable tablet Chew 2 tablets by mouth daily as needed for indigestion or heartburn.    . fenofibrate (TRICOR) 48 MG tablet TAKE 1 TABLET BY MOUTH EVERY DAY 90 tablet 1  . fenofibrate micronized (LOFIBRA) 67 MG capsule Take 1 capsule (67 mg total) by mouth daily before breakfast. 30 capsule 11  . ibuprofen (ADVIL,MOTRIN) 200 MG tablet Take 400 mg by mouth daily as needed for headache or moderate pain.    Marland Kitchen loratadine (CLARITIN) 10 MG tablet Take 10 mg by mouth daily as needed for allergies.    Marland Kitchen LORazepam (ATIVAN) 0.5 MG tablet Take 1 tablet (0.5 mg total) by mouth 2 (two) times daily as needed for anxiety. 60 tablet 1  . Multiple Vitamin (MULTIVITAMIN WITH MINERALS) TABS tablet Take 1 tablet by mouth daily.    . SUMAtriptan (IMITREX) 50 MG tablet TAKE 1 TABLET BY MOUTH AS NEEDED FOR MIGRAINE, MAY REPEAT IN 2 HOURS IF HEADACHE PERSISTS/RECURS 9 tablet 2  . triamterene-hydrochlorothiazide (MAXZIDE-25) 37.5-25 MG tablet TAKE 1 TABLET BY MOUTH EVERY DAY 90 tablet 1  . FLUoxetine (PROZAC) 40 MG capsule Take 2 capsules (80 mg total) by mouth daily. 180 capsule 1  . QUEtiapine (SEROQUEL) 50 MG tablet Take 2 tablets (100 mg total) by mouth at bedtime. 180 tablet 1  . zolpidem (AMBIEN) 10 MG tablet Take 1 tablet (10 mg total) by mouth at bedtime as needed for sleep. 30 tablet 1   No current facility-administered medications on file prior to visit.    BP (!) 127/98 (BP Location: Right Arm, Patient Position: Sitting, Cuff Size: Small)   Pulse 90   Temp (!) 97.5 F (36.4 C) (Temporal)   Resp 16   Ht 5\' 10"  (1.778 m)   Wt 203 lb (92.1 kg)   SpO2 99%   BMI 29.13 kg/m       Objective:    Physical Exam Constitutional:      General: He is not in acute distress.    Appearance: He is well-developed.  HENT:     Head: Normocephalic and atraumatic.  Eyes:     Comments: Internal stye noted in right  lower lid, some external swelling and erythema of lower lid noted.    Pulmonary:     Effort: Pulmonary effort is normal.  Neurological:     Mental Status: He is alert and oriented to person, place, and time.  Psychiatric:        Behavior: Behavior normal.        Thought Content: Thought content normal.            Assessment & Plan:  HTN- pt walked 2 miles in the heat today to his appointment. He has hx of HTN and is maintained on maxide.   Plan repeat in 3 months with pcp.   BP Readings from Last 3 Encounters:  02/17/20 (!) 127/98  10/20/19 126/82  06/24/19 130/85   Stye- advised pt to continue bid warm compresses.  Since increased redness/swelling last few days will rx with keflex. Pt is advised to call if symptoms worsen or if symptoms fail to improve.   This visit occurred during the SARS-CoV-2 public health emergency.  Safety protocols were in place, including screening questions prior to the visit, additional usage of staff PPE, and extensive cleaning of exam room while observing appropriate contact time as indicated for disinfecting solutions.

## 2020-02-17 NOTE — Patient Instructions (Signed)
Please continue warm compresses twice daily. Begin keflex. Call if symptoms if symptoms worsen or if not improved in 1 week.   Stye  A stye, also known as a hordeolum, is a bump that forms on an eyelid. It may look like a pimple next to the eyelash. A stye can form inside the eyelid (internal stye) or outside the eyelid (external stye). A stye can cause redness, swelling, and pain on the eyelid. Styes are very common. Anyone can get them at any age. They usually occur in just one eye, but you may have more than one in either eye. What are the causes? A stye is caused by an infection. The infection is almost always caused by bacteria called Staphylococcus aureus. This is a common type of bacteria that lives on the skin. An internal stye may result from an infected oil-producing gland inside the eyelid. An external stye may be caused by an infection at the base of the eyelash (hair follicle). What increases the risk? You are more likely to develop a stye if:  You have had a stye before.  You have any of these conditions: ? Diabetes. ? Red, itchy, inflamed eyelids (blepharitis). ? A skin condition such as seborrheic dermatitis or rosacea. ? High fat levels in your blood (lipids). What are the signs or symptoms? The most common symptom of a stye is eyelid pain. Internal styes are more painful than external styes. Other symptoms may include:  Painful swelling of your eyelid.  A scratchy feeling in your eye.  Tearing and redness of your eye.  Pus draining from the stye. How is this diagnosed? Your health care provider may be able to diagnose a stye just by examining your eye. The health care provider may also check to make sure:  You do not have a fever or other signs of a more serious infection.  The infection has not spread to other parts of your eye or areas around your eye. How is this treated? Most styes will clear up in a few days without treatment or with warm compresses  applied to the area. You may need to use antibiotic drops or ointment to treat an infection. In some cases, if your stye does not heal with routine treatment, your health care provider may drain pus from the stye using a thin blade or needle. This may be done if the stye is large, causing a lot of pain, or affecting your vision. Follow these instructions at home:  Take over-the-counter and prescription medicines only as told by your health care provider. This includes eye drops or ointments.  If you were prescribed an antibiotic medicine, apply or use it as told by your health care provider. Do not stop using the antibiotic even if your condition improves.  Apply a warm, wet cloth (warm compress) to your eye for 5-10 minutes, 4 times a day.  Clean the affected eyelid as directed by your health care provider.  Do not wear contact lenses or eye makeup until your stye has healed.  Do not try to pop or drain the stye.  Do not rub your eye. Contact a health care provider if:  You have chills or a fever.  Your stye does not go away after several days.  Your stye affects your vision.  Your eyeball becomes swollen, red, or painful. Get help right away if:  You have pain when moving your eye around. Summary  A stye is a bump that forms on an eyelid. It may  look like a pimple next to the eyelash.  A stye can form inside the eyelid (internal stye) or outside the eyelid (external stye). A stye can cause redness, swelling, and pain on the eyelid.  Your health care provider may be able to diagnose a stye just by examining your eye.  Apply a warm, wet cloth (warm compress) to your eye for 5-10 minutes, 4 times a day. This information is not intended to replace advice given to you by your health care provider. Make sure you discuss any questions you have with your health care provider. Document Revised: 08/09/2017 Document Reviewed: 05/09/2017 Elsevier Patient Education  2020 [employer].

## 2020-02-17 NOTE — Telephone Encounter (Signed)
See mychart.  

## 2020-02-23 ENCOUNTER — Ambulatory Visit: Payer: BC Managed Care – PPO | Admitting: Physical Therapy

## 2020-02-26 ENCOUNTER — Encounter: Payer: BC Managed Care – PPO | Admitting: Physical Therapy

## 2020-03-22 ENCOUNTER — Ambulatory Visit
Admission: RE | Admit: 2020-03-22 | Discharge: 2020-03-22 | Disposition: A | Discharge status: Home / Self Care | Payer: BC Managed Care – PPO | Source: Ambulatory Visit | Attending: Physician Assistant | Admitting: Physician Assistant

## 2020-03-22 ENCOUNTER — Other Ambulatory Visit: Payer: Self-pay

## 2020-03-22 DIAGNOSIS — G8929 Other chronic pain: Secondary | ICD-10-CM

## 2020-03-28 ENCOUNTER — Encounter: Payer: Self-pay | Admitting: Physician Assistant

## 2020-03-28 ENCOUNTER — Ambulatory Visit: Payer: BC Managed Care – PPO | Admitting: Physician Assistant

## 2020-03-28 ENCOUNTER — Other Ambulatory Visit: Payer: Self-pay

## 2020-03-28 DIAGNOSIS — M25512 Pain in left shoulder: Secondary | ICD-10-CM

## 2020-03-28 MED ORDER — DICLOFENAC SODIUM 75 MG PO TBEC: 75.0000 mg | DELAYED_RELEASE_TABLET | Freq: Two times a day (BID) | ORAL | 0 refills | Status: DC

## 2020-03-28 MED ORDER — METHYLPREDNISOLONE ACETATE 40 MG/ML IJ SUSP
40.0000 mg | INTRAMUSCULAR | Status: AC | PRN
  Administered 2020-03-28: 40 mg via INTRA_ARTICULAR

## 2020-03-28 MED ORDER — LIDOCAINE HCL 1 % IJ SOLN
3.0000 mL | INTRAMUSCULAR | Status: AC | PRN
  Administered 2020-03-28: 3 mL

## 2020-03-28 NOTE — Progress Notes (Signed)
Office Visit Note   Patient: George Ward           Date of Birth: 20-Mar-1971           MRN: [id number] Visit Date: 03/28/2020              Requested by: Esperanza Richters, PA-C [id number] [place],  [place] [address] PCP: Esperanza Richters, PA-C   Assessment & Plan: Visit Diagnoses:  1. Left shoulder pain, unspecified chronicity     Plan: He will continue to work on range of motion strengthening of the left shoulder shown by physical therapy.  Follow-up with [place] in 6 weeks to check his response to the injection and continued rehab at the shoulder.  Questions were encouraged and answered.  Also placed him on diclofenac 75 mg twice daily no other NSAIDs while on the diclofenac and this is discussed with patient at length.  Follow-Up Instructions: Return in about 6 weeks (around 05/09/2020).   Orders:  Orders Placed This Encounter  Procedures  . Large Joint Inj: L subacromial bursa   Meds ordered this encounter  Medications  . diclofenac (VOLTAREN) 75 MG EC tablet    Sig: Take 1 tablet (75 mg total) by mouth 2 (two) times daily.    Dispense:  60 tablet    Refill:  0      Procedures: Large Joint Inj: L subacromial bursa on 03/28/2020 5:03 PM Indications: pain Details: 22 G 1.5 in needle, superior approach  Arthrogram: No  Medications: 3 mL lidocaine 1 %; 40 mg methylPREDNISolone acetate 40 MG/ML Outcome: tolerated well, no immediate complications Procedure, treatment alternatives, risks and benefits explained, specific risks discussed. Consent was given by the patient. Immediately prior to procedure a time out was called to verify the correct patient, procedure, equipment, support staff and site/side marked as required. Patient was prepped and draped in the usual sterile fashion.       Clinical Data: No additional findings.   Subjective: Chief Complaint  Patient presents with  . Left Shoulder - Follow-up    HPI [name] returns today to go  over the MRI of his left shoulder.  He states pain in the shoulder is unchanged.  MRI of the left shoulder dated 03/22/2020 is reviewed with patient.  This showed this mild acute intersubstance tear of the superior subscapularis tendon.  No high-grade tear.  Mild supraspinatus tendinosis.  Review of Systems SEE HPI  Objective: Vital Signs: There were no vitals taken for this visit.  Physical Exam Constitutional:      Appearance: He is not ill-appearing or diaphoretic.  Pulmonary:     Effort: Pulmonary effort is normal.  Neurological:     Mental Status: He is alert and oriented to person, place, and time.  Psychiatric:        Behavior: Behavior normal.     Ortho Exam Positive impingement testing on the left.  5 out of 5 strengths external/internal rotation against resistance.  Empty can test negative bilaterally. Specialty Comments:  No specialty comments available.  Imaging: No results found.   PMFS History: Patient Active Problem List   Diagnosis Date Noted  . 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 MDD (major depressive disorder) 03/06/2017   Past Medical History:  Diagnosis Date  . Alcohol abuse    Remission >20 years ago  . Allergy   . Anxiety   . Asthma   . Depression    MAJOR DEPRESSIVE DISORDER  .  Depression with anxiety   . Drug abuse (HCC)    Remission >20 years ago  . Encounter for HIV (human immunodeficiency virus) test 2014-2015  . Environmental allergies   . History of chicken pox   . Hyperlipidemia   . Hypertension   . Hypoalphalipoproteinemia, familial   . Lumbar pain   . Migraines   . Primary generalized (osteo)arthritis   . Seasonal allergies     Family History  Problem Relation Age of Onset  . Hypertension Mother   . Hyperlipidemia Father   . Healthy Sister   . Colon cancer Maternal Grandfather   . Prostate cancer Maternal Grandfather   . Congestive Heart Failure Maternal Grandfather   . Dementia Paternal Grandmother   .  Heart disease Paternal Grandfather   . Heart attack Paternal Grandfather   . Hypertension Maternal Uncle   . Hyperlipidemia Maternal Uncle   . Hepatitis Paternal Aunt   . Hepatitis C Paternal Aunt   . Hypertension Paternal Uncle   . Hyperlipidemia Paternal Uncle   . Healthy Son        x1  . Healthy Daughter        x4    Past Surgical History:  Procedure Laterality Date  . EXCISION MASS HEAD  03/17/2018   excision cells on head  ; still in testing for cancer per patient report   . LUMBAR EPIDURAL INJECTION    . UMBILICAL HERNIA REPAIR N/A 04/02/2018   Procedure: OPEN UMBILICAL HERNIA REPAIR WITH INSERTION OF MESH;  Surgeon: Andria Meuse, MD;  Location: [facility];  Service: General;  Laterality: N/A;  . WISDOM TOOTH EXTRACTION  1989-1990   Social History   Occupational History  . Not on file  Tobacco Use  . Smoking status: Former Smoker    Types: Cigarettes    Quit date: 03/07/1988    Years since quitting: 32.0  . Smokeless tobacco: Former Clinical biochemist  . Vaping Use: Never used  Substance and Sexual Activity  . Alcohol use: Not Currently  . Drug use: Not Currently  . Sexual activity: Yes

## 2020-03-30 ENCOUNTER — Ambulatory Visit: Payer: BC Managed Care – PPO | Admitting: Physician Assistant

## 2020-04-18 ENCOUNTER — Other Ambulatory Visit: Payer: Self-pay | Admitting: Physician Assistant

## 2020-04-20 ENCOUNTER — Other Ambulatory Visit: Payer: Self-pay | Admitting: Medical

## 2020-05-09 ENCOUNTER — Ambulatory Visit: Payer: BC Managed Care – PPO | Admitting: Orthopaedic Surgery

## 2020-05-15 ENCOUNTER — Other Ambulatory Visit: Payer: Self-pay | Admitting: Orthopaedic Surgery

## 2020-05-20 ENCOUNTER — Encounter: Payer: Self-pay | Admitting: Medical

## 2020-05-23 ENCOUNTER — Encounter: Payer: Self-pay | Admitting: Medical

## 2020-05-23 ENCOUNTER — Telehealth (INDEPENDENT_AMBULATORY_CARE_PROVIDER_SITE_OTHER): Payer: BC Managed Care – PPO | Admitting: Medical

## 2020-05-23 ENCOUNTER — Other Ambulatory Visit: Payer: Self-pay

## 2020-05-23 DIAGNOSIS — J029 Acute pharyngitis, unspecified: Secondary | ICD-10-CM | POA: Diagnosis not present

## 2020-05-23 DIAGNOSIS — J3489 Other specified disorders of nose and nasal sinuses: Secondary | ICD-10-CM

## 2020-05-23 DIAGNOSIS — R05 Cough: Secondary | ICD-10-CM

## 2020-05-23 DIAGNOSIS — H9203 Otalgia, bilateral: Secondary | ICD-10-CM | POA: Diagnosis not present

## 2020-05-23 DIAGNOSIS — R059 Cough, unspecified: Secondary | ICD-10-CM

## 2020-05-23 MED ORDER — PREDNISONE 10 MG PO TABS: ORAL_TABLET | ORAL | 0 refills | Status: DC

## 2020-05-23 MED ORDER — BENZONATATE 100 MG PO CAPS: 100.0000 mg | ORAL_CAPSULE | Freq: Three times a day (TID) | ORAL | 0 refills | Status: DC | PRN

## 2020-05-23 MED ORDER — AMOXICILLIN-POT CLAVULANATE 875-125 MG PO TABS: 1.0000 | ORAL_TABLET | Freq: Two times a day (BID) | ORAL | 0 refills | Status: DC

## 2020-05-23 NOTE — Progress Notes (Signed)
   Subjective:    Patient ID: George Ward, male    DOB: 1971/03/23, 49 y.o.   MRN: [id number]  HPI  Virtual Visit via Telephone Note  I connected with George Ward on 05/23/20 at  3:40 PM EDT by telephone and verified that I am speaking with the correct person using two identifiers.  Location: Patient: [employer]. Provider: office.   I discussed the limitations, risks, security and privacy concerns of performing an evaluation and management service by telephone and the availability of in person appointments. I also discussed with the patient that there may be a patient responsible charge related to this service. The patient expressed understanding and agreed to proceed.   History of Present Illness: Pt states he has been sick for about 8-9 days. States started out with st. Then got sinus congestion and ear pain. He also got dry cough.  Pt had no fever. A lot of post nasal drainage. Some productive cough now.   Ha on and off and tired.   No loss of taste or smell.   No fevers.  Pt has been taking nyquil.   Pt has been fully vaccinated against covid.  Working 12 hours day.   Observations/Objective: General- sounds congested. No acute distress. heent- some sinus pressure/pain on self palpation.  Assessment and Plan: Hx of allergic rhinitis with possible sinus infection.  Augmentin antibiotic rx, taper prednisone 4 day course and  benzonate for cough.   Continue claritin  and nasal spray.  Off work until at least Friday.    Pt will get tested for covid tomorrow. Advised do rapid home test. Update on my chart regarding the results.  Follow up in 7 days or as needed  Follow Up Instructions:    I discussed the assessment and treatment plan with the patient. The patient was provided an opportunity to ask questions and all were answered. The patient agreed with the plan and demonstrated an understanding of the instructions.   The patient was advised to  call back or seek an in-person evaluation if the symptoms worsen or if the condition fails to improve as anticipated.  I provided 25 minutes of non-face-to-face time during this encounter.   Esperanza Richters, PA-C   Review of Systems     Objective:   Physical Exam        Assessment & Plan:

## 2020-05-23 NOTE — Patient Instructions (Addendum)
Hx of allergic rhinitis with possible sinus infection.  Augmentin antibiotic rx, taper prednisone 4 day course and  benzonate for cough.   Continue claritin  and nasal spray.  Off work until at least Friday.    Pt will get tested for covid tomorrow. Advised do rapid home test. Update on my chart regarding the results.  Follow up in 7 days or as needed

## 2020-05-27 ENCOUNTER — Encounter: Payer: Self-pay | Admitting: Medical

## 2020-05-29 ENCOUNTER — Other Ambulatory Visit: Payer: Self-pay | Admitting: Medical

## 2020-06-01 ENCOUNTER — Ambulatory Visit: Payer: BC Managed Care – PPO | Admitting: Medical

## 2020-06-01 ENCOUNTER — Other Ambulatory Visit: Payer: Self-pay

## 2020-06-01 ENCOUNTER — Ambulatory Visit (HOSPITAL_BASED_OUTPATIENT_CLINIC_OR_DEPARTMENT_OTHER)
Admission: RE | Admit: 2020-06-01 | Discharge: 2020-06-01 | Disposition: A | Discharge status: Home / Self Care | Payer: BC Managed Care – PPO | Source: Ambulatory Visit | Attending: Medical | Admitting: Medical

## 2020-06-01 VITALS — BP 122/84 | HR 90 | Temp 97.9°F | Resp 17 | Ht 70.0 in | Wt 189.0 lb

## 2020-06-01 DIAGNOSIS — R05 Cough: Secondary | ICD-10-CM

## 2020-06-01 DIAGNOSIS — J3489 Other specified disorders of nose and nasal sinuses: Secondary | ICD-10-CM

## 2020-06-01 DIAGNOSIS — R0989 Other specified symptoms and signs involving the circulatory and respiratory systems: Secondary | ICD-10-CM | POA: Insufficient documentation

## 2020-06-01 DIAGNOSIS — R0600 Dyspnea, unspecified: Secondary | ICD-10-CM | POA: Diagnosis not present

## 2020-06-01 DIAGNOSIS — R059 Cough, unspecified: Secondary | ICD-10-CM

## 2020-06-01 DIAGNOSIS — R5383 Other fatigue: Secondary | ICD-10-CM | POA: Diagnosis not present

## 2020-06-01 LAB — CBC WITH DIFFERENTIAL/PLATELET
Absolute Monocytes: 608 cells/uL (ref 200–950)
Basophils Absolute: 38 cells/uL (ref 0–200)
Basophils Relative: 0.6 %
Eosinophils Absolute: 410 cells/uL (ref 15–500)
Eosinophils Relative: 6.4 %
HCT: 40.3 % (ref 38.5–50.0)
Hemoglobin: 13.1 g/dL — ABNORMAL LOW (ref 13.2–17.1)
Lymphs Abs: 1805 cells/uL (ref 850–3900)
MCH: 28.2 pg (ref 27.0–33.0)
MCHC: 32.5 g/dL (ref 32.0–36.0)
MCV: 86.9 fL (ref 80.0–100.0)
MPV: 11.2 fL (ref 7.5–12.5)
Monocytes Relative: 9.5 %
Neutro Abs: 3539 cells/uL (ref 1500–7800)
Neutrophils Relative %: 55.3 %
Platelets: 281 10*3/uL (ref 140–400)
RBC: 4.64 10*6/uL (ref 4.20–5.80)
RDW: 13.6 % (ref 11.0–15.0)
Total Lymphocyte: 28.2 %
WBC: 6.4 10*3/uL (ref 3.8–10.8)

## 2020-06-01 LAB — COMPREHENSIVE METABOLIC PANEL
AG Ratio: 2 (calc) (ref 1.0–2.5)
ALT: 33 U/L (ref 9–46)
AST: 28 U/L (ref 10–40)
Albumin: 4.3 g/dL (ref 3.6–5.1)
Alkaline phosphatase (APISO): 61 U/L (ref 36–130)
BUN: 22 mg/dL (ref 7–25)
CO2: 28 mmol/L (ref 20–32)
Calcium: 9.3 mg/dL (ref 8.6–10.3)
Chloride: 104 mmol/L (ref 98–110)
Creat: 1.03 mg/dL (ref 0.60–1.35)
Globulin: 2.2 g/dL (calc) (ref 1.9–3.7)
Glucose, Bld: 97 mg/dL (ref 65–99)
Potassium: 4.3 mmol/L (ref 3.5–5.3)
Sodium: 138 mmol/L (ref 135–146)
Total Bilirubin: 0.5 mg/dL (ref 0.2–1.2)
Total Protein: 6.5 g/dL (ref 6.1–8.1)

## 2020-06-01 MED ORDER — HYDROCODONE-HOMATROPINE 5-1.5 MG/5ML PO SYRP
5.0000 mL | ORAL_SOLUTION | Freq: Four times a day (QID) | ORAL | 0 refills | Status: DC | PRN
Start: 2020-06-01 — End: 2021-03-08

## 2020-06-01 MED ORDER — BUDESONIDE-FORMOTEROL FUMARATE 80-4.5 MCG/ACT IN AERO: 2.0000 | INHALATION_SPRAY | Freq: Two times a day (BID) | RESPIRATORY_TRACT | 3 refills | Status: DC

## 2020-06-01 MED ORDER — PREDNISONE 10 MG (21) PO TBPK: ORAL_TABLET | ORAL | 0 refills | Status: DC

## 2020-06-01 NOTE — Progress Notes (Signed)
Subjective:    Patient ID: George Ward, male    DOB: December 15, 1970, 49 y.o.   MRN: [id number]  HPI  Pt in for follow up.  Pt states he has some chest congestion. He has some non prodcutive cough. He feels like he needs to use his inhaler. He coughs more late more at night.  Mid chest sensation as if he had asthma attack.  No deep substernal pain. No associated cardiac like signs/symptoms.  Pt had hx of covid vaccines and last Friday did rapid otc test that was negative.  Pt has been taking augmentin, short taper dose prednisone and has used benzonatate for cough.  Pt did use albuterol the other day and he felt that helped.   Pt notes that when he was on 4 days of taper prednisone felt a lot better.    Review of Systems  Constitutional: Positive for fatigue. Negative for chills and fever.  Eyes: Negative for pain.  Respiratory: Positive for shortness of breath. Negative for cough, chest tightness and wheezing.   Cardiovascular: Negative for chest pain and palpitations.  Gastrointestinal: Negative for abdominal pain, blood in stool, diarrhea, nausea and vomiting.  Musculoskeletal: Negative for back pain, myalgias and neck pain.       See hpi on anterior chest congestion sensation.   Neurological: Negative for dizziness, speech difficulty, weakness and headaches.  Hematological: Negative for adenopathy. Does not bruise/bleed easily.  Psychiatric/Behavioral: Negative for behavioral problems and confusion. The patient is not nervous/anxious.     Past Medical History:  Diagnosis Date  . Alcohol abuse    Remission >20 years ago  . Allergy   . Anxiety   . Asthma   . Depression    MAJOR DEPRESSIVE DISORDER  . Depression with anxiety   . Drug abuse (HCC)    Remission >20 years ago  . Encounter for HIV (human immunodeficiency virus) test 2014-2015  . Environmental allergies   . History of chicken pox   . Hyperlipidemia   . Hypertension   . Hypoalphalipoproteinemia,  familial   . Lumbar pain   . Migraines   . Primary generalized (osteo)arthritis   . Seasonal allergies      Social History   Socioeconomic History  . Marital status: Married    Spouse name: Not on file  . Number of children: Not on file  . Years of education: Not on file  . Highest education level: Not on file  Occupational History  . Not on file  Tobacco Use  . Smoking status: Former Smoker    Types: Cigarettes    Quit date: 03/07/1988    Years since quitting: 32.2  . Smokeless tobacco: Former Clinical biochemist  . Vaping Use: Never used  Substance and Sexual Activity  . Alcohol use: Not Currently  . Drug use: Not Currently  . Sexual activity: Yes  Other Topics Concern  . Not on file  Social History Narrative  . Not on file   Social Determinants of Health   Financial Resource Strain:   . Difficulty of Paying Living Expenses: Not on file  Food Insecurity:   . Worried About Programme researcher, broadcasting/film/video in the Last Year: Not on file  . Ran Out of Food in the Last Year: Not on file  Transportation Needs:   . Lack of Transportation (Medical): Not on file  . Lack of Transportation (Non-Medical): Not on file  Physical Activity:   . Days of Exercise per Week: Not on file  .  Minutes of Exercise per Session: Not on file  Stress:   . Feeling of Stress : Not on file  Social Connections:   . Frequency of Communication with Friends and Family: Not on file  . Frequency of Social Gatherings with Friends and Family: Not on file  . Attends Religious Services: Not on file  . Active Member of Clubs or Organizations: Not on file  . Attends Banker Meetings: Not on file  . Marital Status: Not on file  Intimate Partner Violence:   . Fear of Current or Ex-Partner: Not on file  . Emotionally Abused: Not on file  . Physically Abused: Not on file  . Sexually Abused: Not on file    Past Surgical History:  Procedure Laterality Date  . EXCISION MASS HEAD  03/17/2018   excision  cells on head  ; still in testing for cancer per patient report   . LUMBAR EPIDURAL INJECTION    . UMBILICAL HERNIA REPAIR N/A 04/02/2018   Procedure: OPEN UMBILICAL HERNIA REPAIR WITH INSERTION OF MESH;  Surgeon: Andria Meuse, MD;  Location: [facility];  Service: General;  Laterality: N/A;  . WISDOM TOOTH EXTRACTION  1989-1990    Family History  Problem Relation Age of Onset  . Hypertension Mother   . Hyperlipidemia Father   . Healthy Sister   . Colon cancer Maternal Grandfather   . Prostate cancer Maternal Grandfather   . Congestive Heart Failure Maternal Grandfather   . Dementia Paternal Grandmother   . Heart disease Paternal Grandfather   . Heart attack Paternal Grandfather   . Hypertension Maternal Uncle   . Hyperlipidemia Maternal Uncle   . Hepatitis Paternal Aunt   . Hepatitis C Paternal Aunt   . Hypertension Paternal Uncle   . Hyperlipidemia Paternal Uncle   . Healthy Son        x1  . Healthy Daughter        x4    Allergies  Allergen Reactions  . Co-Trimoxazole [Sulfamethoxazole-Trimethoprim] Nausea And Vomiting  . Lisinopril Cough    With Wheezing   . Sulfa Antibiotics Nausea And Vomiting    Current Outpatient Medications on File Prior to Visit  Medication Sig Dispense Refill  . acetaminophen (TYLENOL) 500 MG tablet Take 1,000 mg by mouth daily as needed for moderate pain or headache.    . albuterol (PROVENTIL HFA;VENTOLIN HFA) 108 (90 Base) MCG/ACT inhaler Inhale 1-2 puffs into the lungs every 6 (six) hours as needed for wheezing or shortness of breath. Ventolin    . amoxicillin-clavulanate (AUGMENTIN) 875-125 MG tablet Take 1 tablet by mouth 2 (two) times daily. 20 tablet 0  . atorvastatin (LIPITOR) 40 MG tablet TAKE 1 TABLET BY MOUTH EVERY DAY 90 tablet 1  . benzonatate (TESSALON) 100 MG capsule Take 1 capsule (100 mg total) by mouth 3 (three) times daily as needed for cough. 30 capsule 0  . calcium carbonate (TUMS - DOSED IN MG ELEMENTAL CALCIUM) 500 MG  chewable tablet Chew 2 tablets by mouth daily as needed for indigestion or heartburn.    . cephALEXin (KEFLEX) 500 MG capsule Take 1 capsule (500 mg total) by mouth 3 (three) times daily. 21 capsule 0  . diclofenac (VOLTAREN) 75 MG EC tablet TAKE 1 TABLET BY MOUTH TWICE A DAY 60 tablet 0  . fenofibrate micronized (LOFIBRA) 67 MG capsule Take 1 capsule (67 mg total) by mouth daily before breakfast. 30 capsule 11  . ibuprofen (ADVIL,MOTRIN) 200 MG tablet Take 400 mg by  mouth daily as needed for headache or moderate pain.    Marland Kitchen loratadine (CLARITIN) 10 MG tablet Take 10 mg by mouth daily as needed for allergies.    Marland Kitchen LORazepam (ATIVAN) 0.5 MG tablet Take 1 tablet (0.5 mg total) by mouth 2 (two) times daily as needed for anxiety. 60 tablet 1  . Multiple Vitamin (MULTIVITAMIN WITH MINERALS) TABS tablet Take 1 tablet by mouth daily.    . predniSONE (DELTASONE) 10 MG tablet 4 tab po day 1, 3 tab po day 2, 2 tab po day 3, 1 tab po day 4 10 tablet 0  . SUMAtriptan (IMITREX) 50 MG tablet TAKE 1 TABLET BY MOUTH AS NEEDED FOR MIGRAINE, MAY REPEAT IN 2 HOURS IF HEADACHE PERSISTS/RECURS 9 tablet 2  . triamterene-hydrochlorothiazide (MAXZIDE-25) 37.5-25 MG tablet TAKE 1 TABLET BY MOUTH EVERY DAY 90 tablet 1  . FLUoxetine (PROZAC) 40 MG capsule Take 2 capsules (80 mg total) by mouth daily. 180 capsule 1  . QUEtiapine (SEROQUEL) 50 MG tablet Take 2 tablets (100 mg total) by mouth at bedtime. 180 tablet 1  . zolpidem (AMBIEN) 10 MG tablet Take 1 tablet (10 mg total) by mouth at bedtime as needed for sleep. 30 tablet 1   No current facility-administered medications on file prior to visit.    BP 122/84 (BP Location: Left Arm, Patient Position: Sitting, Cuff Size: Large)   Pulse 90   Temp 97.9 F (36.6 C) (Oral)   Resp 17   Ht 5\' 10"  (1.778 m)   Wt 189 lb (85.7 kg)   SpO2 98%   BMI 27.12 kg/m       Objective:   Physical Exam  General  Mental Status - Alert. General Appearance - Well groomed. Not in  acute distress.  Skin Rashes- No Rashes.  HEENT Head- Normal. Ear Auditory Canal - Left- Normal. Right - Normal.Tympanic Membrane- Left- Normal. Right- Normal. Eye Sclera/Conjunctiva- Left- Normal. Right- Normal. Nose & Sinuses Nasal Mucosa- Left-  Boggy and Congested. Right-  Boggy and  Congested.Bilateral  Faint maxillary and frontal sinus pressure. Mouth & Throat Lips: Upper Lip- Normal: no dryness, cracking, pallor, cyanosis, or vesicular eruption. Lower Lip-Normal: no dryness, cracking, pallor, cyanosis or vesicular eruption. Buccal Mucosa- Bilateral- No Aphthous ulcers. Oropharynx- No Discharge or Erythema. Tonsils: Characteristics- Bilateral- No Erythema or Congestion. Size/Enlargement- Bilateral- No enlargement. Discharge- bilateral-None.  Neck Neck- Supple. No Masses.   Chest and Lung Exam Auscultation: Breath Sounds:-Clear even and unlabored.  Cardiovascular Auscultation:Rythm- Regular, rate and rhythm. Murmurs & Other Heart Sounds:Ausculatation of the heart reveal- No Murmurs.  Lymphatic Head & Neck General Head & Neck Lymphatics: Bilateral: Description- No Localized lymphadenopathy.       Assessment & Plan:  Appear recently that you had sinus infection with bronchitis.  Sinus pressure much improved with Augmentin.  Still having some bronchitis type symptoms.  Recent mild dyspnea which he described as mild like.  Report feeling improved during the 4-day taper dose of prednisone.  Recent Covid test negative and given vaccinated against Covid.   We will get chest x-ray, CBC and metabolic panel.  Make sure no walking pneumonia present.  Will Rx 6-day taper dose of prednisone.  Symbicort inhaler to use daily.  Use albuterol as a backup.  Also prescribing Hycodan for cough.  Follow-up 7 to 10 days or as needed.

## 2020-06-01 NOTE — Patient Instructions (Signed)
Appear recently that you had sinus infection with bronchitis.  Sinus pressure much improved with Augmentin.  Still having some bronchitis type symptoms.  Recent mild dyspnea which he described as mild like.  Report feeling improved during the 4-day taper dose of prednisone.  Recent Covid test negative and given vaccinated against Covid.   We will get chest x-ray, CBC and metabolic panel.  Make sure no walking pneumonia present.  Will Rx 6-day taper dose of prednisone.  Symbicort inhaler to use daily.  Use albuterol as a backup.  Also prescribing Hycodan for cough.  Follow-up 7 to 10 days or as needed.

## 2020-06-03 ENCOUNTER — Ambulatory Visit: Payer: BC Managed Care – PPO | Admitting: Medical

## 2020-06-04 ENCOUNTER — Encounter: Payer: Self-pay | Admitting: Medical

## 2020-06-06 ENCOUNTER — Encounter: Payer: Self-pay | Admitting: Medical

## 2020-06-11 ENCOUNTER — Other Ambulatory Visit: Payer: Self-pay | Admitting: Orthopaedic Surgery

## 2020-07-01 ENCOUNTER — Encounter: Payer: Self-pay | Admitting: Medical

## 2020-07-06 ENCOUNTER — Encounter: Payer: Self-pay | Admitting: Medical

## 2020-07-14 ENCOUNTER — Other Ambulatory Visit: Payer: Self-pay | Admitting: Orthopaedic Surgery

## 2020-08-05 ENCOUNTER — Other Ambulatory Visit: Payer: Self-pay | Admitting: Medical

## 2020-08-11 ENCOUNTER — Encounter: Payer: Self-pay | Admitting: Medical

## 2020-08-13 MED ORDER — TOBRAMYCIN 0.3 % OP SOLN: 2.0000 [drp] | Freq: Four times a day (QID) | OPHTHALMIC | 0 refills | Status: DC

## 2020-08-15 ENCOUNTER — Other Ambulatory Visit: Payer: Self-pay | Admitting: Orthopaedic Surgery

## 2020-08-24 ENCOUNTER — Encounter: Payer: Self-pay | Admitting: Medical

## 2020-08-24 MED ORDER — SUMATRIPTAN SUCCINATE 50 MG PO TABS: ORAL_TABLET | ORAL | 2 refills | Status: DC

## 2020-09-12 ENCOUNTER — Other Ambulatory Visit: Payer: Self-pay | Admitting: Orthopaedic Surgery

## 2020-09-19 ENCOUNTER — Other Ambulatory Visit: Payer: Self-pay | Admitting: Medical

## 2020-10-02 ENCOUNTER — Other Ambulatory Visit: Payer: Self-pay | Admitting: Medical

## 2020-10-10 ENCOUNTER — Other Ambulatory Visit: Payer: Self-pay | Admitting: Orthopaedic Surgery

## 2020-11-11 ENCOUNTER — Other Ambulatory Visit: Payer: Self-pay | Admitting: Orthopaedic Surgery

## 2020-11-11 NOTE — Telephone Encounter (Signed)
Please advise 

## 2020-11-18 ENCOUNTER — Other Ambulatory Visit: Payer: Self-pay | Admitting: Medical

## 2020-11-22 ENCOUNTER — Other Ambulatory Visit: Payer: Self-pay | Admitting: Medical

## 2020-11-30 ENCOUNTER — Encounter: Payer: Self-pay | Admitting: Medical

## 2020-12-12 ENCOUNTER — Other Ambulatory Visit: Payer: Self-pay | Admitting: Orthopaedic Surgery

## 2021-01-15 ENCOUNTER — Other Ambulatory Visit: Payer: Self-pay | Admitting: Orthopaedic Surgery

## 2021-02-13 ENCOUNTER — Other Ambulatory Visit: Payer: Self-pay | Admitting: Medical

## 2021-02-22 ENCOUNTER — Other Ambulatory Visit: Payer: Self-pay | Admitting: Orthopaedic Surgery

## 2021-02-22 ENCOUNTER — Other Ambulatory Visit: Payer: Self-pay | Admitting: Medical

## 2021-02-22 NOTE — Telephone Encounter (Signed)
Pharmacy comment: Script Clarification:PLEASE CLARIFY DIRECTIONS.

## 2021-02-24 NOTE — Telephone Encounter (Signed)
Sig updated and medication sent to pharmacy

## 2021-02-28 ENCOUNTER — Other Ambulatory Visit: Payer: Self-pay | Admitting: Medical

## 2021-03-02 ENCOUNTER — Encounter: Payer: Self-pay | Admitting: Medical

## 2021-03-02 NOTE — Telephone Encounter (Signed)
Called pt to schedule the appointment

## 2021-03-08 ENCOUNTER — Encounter: Payer: Self-pay | Admitting: Family

## 2021-03-08 ENCOUNTER — Ambulatory Visit: Payer: BC Managed Care – PPO | Admitting: Family

## 2021-03-08 ENCOUNTER — Other Ambulatory Visit: Payer: Self-pay

## 2021-03-08 DIAGNOSIS — G43909 Migraine, unspecified, not intractable, without status migrainosus: Secondary | ICD-10-CM | POA: Diagnosis not present

## 2021-03-08 MED ORDER — PROPRANOLOL HCL 20 MG PO TABS: 20.0000 mg | ORAL_TABLET | Freq: Two times a day (BID) | ORAL | 0 refills | Status: DC

## 2021-03-08 NOTE — Assessment & Plan Note (Signed)
Uncontrolled.  Will initiate propranolol 20mg  bid for migraine prophylaxis. Continue imitrex prn.  Follow up with PCP in 2 weeks.

## 2021-03-08 NOTE — Progress Notes (Signed)
Established Patient Office Visit  Subjective:  Patient ID: George Ward, male    DOB: [birth year]  Age: 50 y.o. MRN: [id number]  CC:  Chief Complaint  Patient presents with   Migraine    More frequent migraine,no trigger    HPI George Ward presents for migraines.  Reports that he first developed migraines in his 20's. Used to be every few weeks,  recently has been every other day. Has been using imitrex prn with variable results.  It is often dependent on how quickly he takes it.   Last migraine was last week (Thursday). Started mid morning and took tylenol, sumatriptan.  Lasted until he fell asleep that night.  He reports occasional associated nausea.  He does have sensitivity to light and noise.    Past Medical History:  Diagnosis Date   Alcohol abuse    Remission >20 years ago   Allergy    Anxiety    Asthma    Depression    MAJOR DEPRESSIVE DISORDER   Depression with anxiety    Drug abuse (HCC)    Remission >20 years ago   Encounter for HIV (human immunodeficiency virus) test 2014-2015   Environmental allergies    History of chicken pox    Hyperlipidemia    Hypertension    Hypoalphalipoproteinemia, familial    Lumbar pain    Migraines    Primary generalized (osteo)arthritis    Seasonal allergies     Past Surgical History:  Procedure Laterality Date   EXCISION MASS HEAD  03/17/2018   excision cells on head  ; still in testing for cancer per patient report    LUMBAR EPIDURAL INJECTION     UMBILICAL HERNIA REPAIR N/A 04/02/2018   Procedure: OPEN UMBILICAL HERNIA REPAIR WITH INSERTION OF MESH;  Surgeon: Andria Meuse, MD;  Location: [facility];  Service: General;  Laterality: N/A;   WISDOM TOOTH EXTRACTION  1989-1990    Family History  Problem Relation Age of Onset   Hypertension Mother    Hyperlipidemia Father    Healthy Sister    Colon cancer Maternal Grandfather    Prostate cancer Maternal Grandfather    Congestive Heart Failure Maternal  Grandfather    Dementia Paternal Grandmother    Heart disease Paternal Grandfather    Heart attack Paternal Grandfather    Hypertension Maternal Uncle    Hyperlipidemia Maternal Uncle    Hepatitis Paternal Aunt    Hepatitis C Paternal Aunt    Hypertension Paternal Uncle    Hyperlipidemia Paternal Uncle    Healthy Son        x1   Healthy Daughter        x4    Social History   Socioeconomic History   Marital status: Married    Spouse name: Not on file   Number of children: Not on file   Years of education: Not on file   Highest education level: Not on file  Occupational History   Not on file  Tobacco Use   Smoking status: Former    Pack years: 0.00    Types: Cigarettes    Quit date: 03/07/1988    Years since quitting: 33.0   Smokeless tobacco: Former  Building services engineer Use: Never used  Substance and Sexual Activity   Alcohol use: Not Currently   Drug use: Not Currently   Sexual activity: Yes  Other Topics Concern   Not on file  Social History Narrative   Not on file  Social Determinants of Health   Financial Resource Strain: Not on file  Food Insecurity: Not on file  Transportation Needs: Not on file  Physical Activity: Not on file  Stress: Not on file  Social Connections: Not on file  Intimate Partner Violence: Not on file    Outpatient Medications Prior to Visit  Medication Sig Dispense Refill   acetaminophen (TYLENOL) 500 MG tablet Take 1,000 mg by mouth daily as needed for moderate pain or headache.     albuterol (PROVENTIL HFA;VENTOLIN HFA) 108 (90 Base) MCG/ACT inhaler Inhale 1-2 puffs into the lungs every 6 (six) hours as needed for wheezing or shortness of breath. Ventolin     atorvastatin (LIPITOR) 40 MG tablet TAKE 1 TABLET BY MOUTH EVERY DAY 90 tablet 1   calcium carbonate (TUMS - DOSED IN MG ELEMENTAL CALCIUM) 500 MG chewable tablet Chew 2 tablets by mouth daily as needed for indigestion or heartburn.     diclofenac (VOLTAREN) 75 MG EC tablet  TAKE 1 TABLET BY MOUTH TWICE A DAY 60 tablet 0   fenofibrate micronized (LOFIBRA) 67 MG capsule TAKE 1 CAPSULE (67 MG TOTAL) BY MOUTH DAILY BEFORE BREAKFAST. 90 capsule 3   ibuprofen (ADVIL,MOTRIN) 200 MG tablet Take 400 mg by mouth daily as needed for headache or moderate pain.     loratadine (CLARITIN) 10 MG tablet Take 10 mg by mouth daily as needed for allergies.     LORazepam (ATIVAN) 0.5 MG tablet Take 1 tablet (0.5 mg total) by mouth 2 (two) times daily as needed for anxiety. 60 tablet 1   Multiple Vitamin (MULTIVITAMIN WITH MINERALS) TABS tablet Take 1 tablet by mouth daily.     SUMAtriptan (IMITREX) 50 MG tablet 1 tablet daily , May repeat in 2 hours if headache persists or recurs. 9 tablet 2   SYMBICORT 80-4.5 MCG/ACT inhaler TAKE 2 PUFFS BY MOUTH TWICE A DAY 10.2 each 3   tobramycin (TOBREX) 0.3 % ophthalmic solution Place 2 drops into the right eye every 6 (six) hours. 5 mL 0   triamterene-hydrochlorothiazide (MAXZIDE-25) 37.5-25 MG tablet TAKE 1 TABLET BY MOUTH EVERY DAY 90 tablet 1   FLUoxetine (PROZAC) 40 MG capsule Take 2 capsules (80 mg total) by mouth daily. 180 capsule 1   QUEtiapine (SEROQUEL) 50 MG tablet Take 2 tablets (100 mg total) by mouth at bedtime. 180 tablet 1   zolpidem (AMBIEN) 10 MG tablet Take 1 tablet (10 mg total) by mouth at bedtime as needed for sleep. 30 tablet 1   amoxicillin-clavulanate (AUGMENTIN) 875-125 MG tablet Take 1 tablet by mouth 2 (two) times daily. 20 tablet 0   cephALEXin (KEFLEX) 500 MG capsule Take 1 capsule (500 mg total) by mouth 3 (three) times daily. 21 capsule 0   HYDROcodone-homatropine (HYCODAN) 5-1.5 MG/5ML syrup Take 5 mLs by mouth every 6 (six) hours as needed for cough. 100 mL 0   predniSONE (STERAPRED UNI-PAK 21 TAB) 10 MG (21) TBPK tablet standar taper over 6 days. 21 tablet 0   No facility-administered medications prior to visit.    Allergies  Allergen Reactions   Co-Trimoxazole [Sulfamethoxazole-Trimethoprim] Nausea And  Vomiting   Lisinopril Cough    With Wheezing    Sulfa Antibiotics Nausea And Vomiting    ROS Review of Systems    Objective:    Physical Exam Constitutional:      General: He is not in acute distress.    Appearance: He is well-developed.  HENT:     Head: Normocephalic and atraumatic.  Cardiovascular:     Rate and Rhythm: Normal rate and regular rhythm.     Heart sounds: No murmur heard. Pulmonary:     Effort: Pulmonary effort is normal. No respiratory distress.     Breath sounds: Normal breath sounds. No wheezing or rales.  Skin:    General: Skin is warm and dry.  Neurological:     General: No focal deficit present.     Mental Status: He is alert and oriented to person, place, and time.     Cranial Nerves: Cranial nerves are intact.     Motor: Motor function is intact.     Gait: Gait is intact.     Comments: Bilateral UE/LE strength is 5/5  Psychiatric:        Behavior: Behavior normal.        Thought Content: Thought content normal.    BP 128/82 (BP Location: Right Arm, Patient Position: Sitting, Cuff Size: Normal)   Pulse 76   Temp 97.6 F (36.4 C)   Resp 18   Ht 5\' 10"  (1.778 m)   Wt 191 lb (86.6 kg)   SpO2 98%   BMI 27.41 kg/m  Wt Readings from Last 3 Encounters:  03/08/21 191 lb (86.6 kg)  06/01/20 189 lb (85.7 kg)  02/17/20 200 lb (90.7 kg)   Assessment & Plan:   Problem List Items Addressed This Visit       Unprioritized   Migraine    Uncontrolled.  Will initiate propranolol 20mg  bid for migraine prophylaxis. Continue imitrex prn.  Follow up with PCP in 2 weeks.        Relevant Medications   propranolol (INDERAL) 20 MG tablet    Meds ordered this encounter  Medications   propranolol (INDERAL) 20 MG tablet    Sig: Take 1 tablet (20 mg total) by mouth 2 (two) times daily.    Dispense:  60 tablet    Refill:  0    Order Specific Question:   Supervising Provider    Answer:   04/18/20 A [[id number]]    Follow-up: Return in about 2 weeks  (around 03/22/2021) for follow up visit with Danise Edge.    03/24/2021, NP

## 2021-03-08 NOTE — Patient Instructions (Signed)
Please add propranolol 20mg  twice daily for migraine.

## 2021-03-12 IMAGING — MR MR SHOULDER*L* W/O CM
6 series · 40 of 40 positions shown · non-contrast
Comparison: 10/22/2018

CLINICAL DATA: Left shoulder pain, limited range of motion

EXAM:
MRI OF THE LEFT SHOULDER WITHOUT CONTRAST
TECHNIQUE: Multiplanar, multisequence MR imaging of the shoulder was performed.
No intravenous contrast was administered.

[Series 3: PD fat-sat · axial · 4.0mm · 0.55mm/px · z∈[-40,+66]mm · 5 of 20 slices shown (1 of 2)]
[im 1/20]
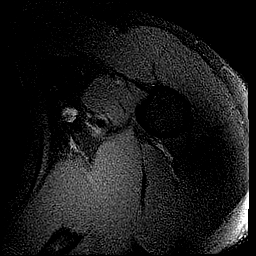
[im 5/20]
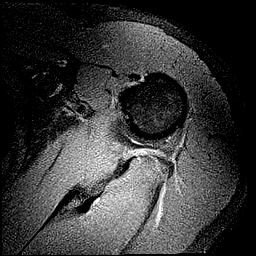
[im 10/20]
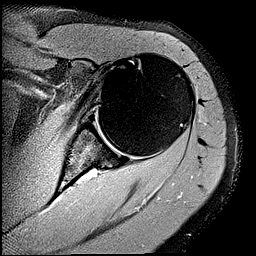
[im 15/20]
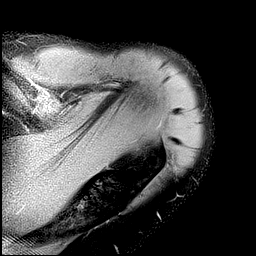
[im 20/20]
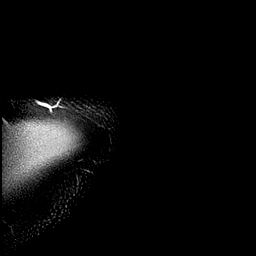

[Series 4: T2 fat-sat · oblique · 4.0mm · 0.55mm/px · 7 of 22 slices shown (1 of 3)]
[im 1/22]
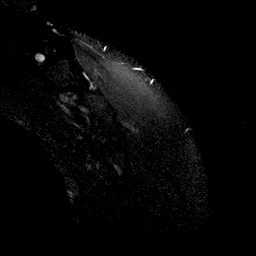
[im 4/22]
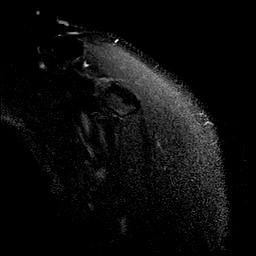
[im 8/22]
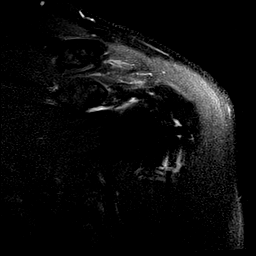
[im 11/22]
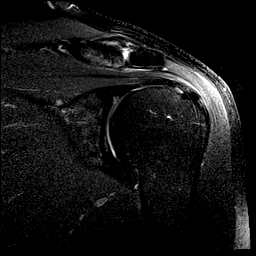
[im 15/22]
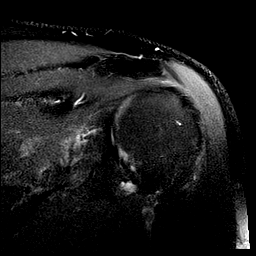
[im 18/22]
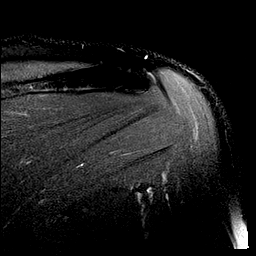
[im 22/22]
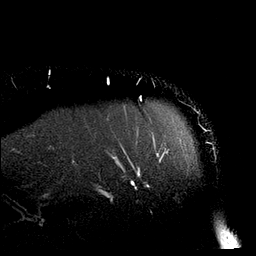

[Series 5: T2 fat-sat · oblique · 4.0mm · 0.55mm/px · 7 of 22 slices shown (2 of 3)]
[im 1/22]
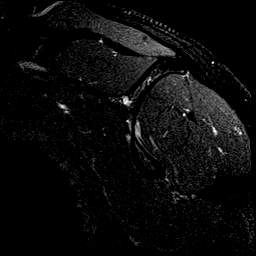
[im 4/22]
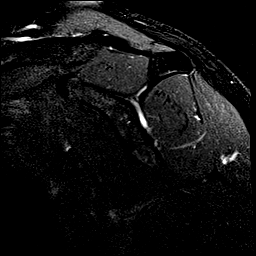
[im 8/22]
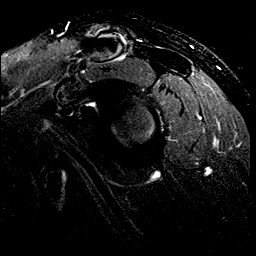
[im 11/22]
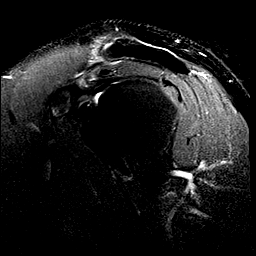
[im 15/22]
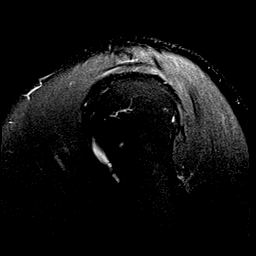
[im 18/22]
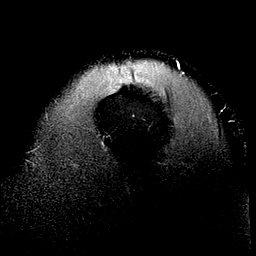
[im 22/22]
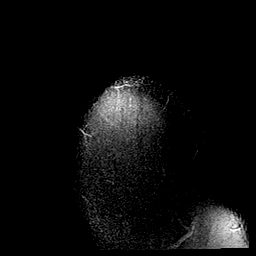

[Series 7: T2 fat-sat · oblique · 4.0mm · 0.55mm/px · 7 of 22 slices shown (3 of 3)]
[im 1/22]
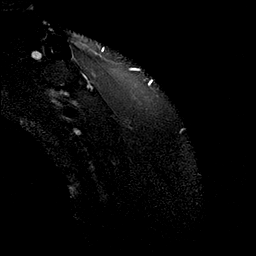
[im 4/22]
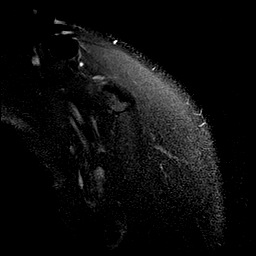
[im 8/22]
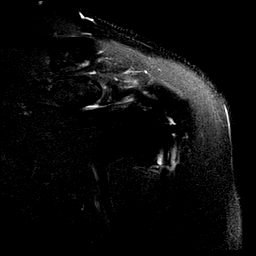
[im 11/22]
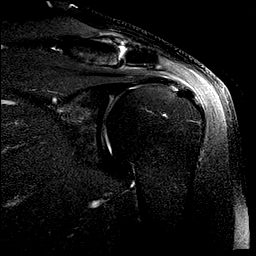
[im 15/22]
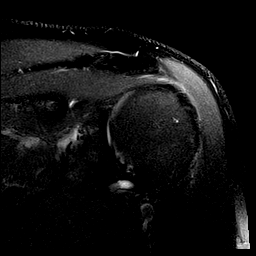
[im 18/22]
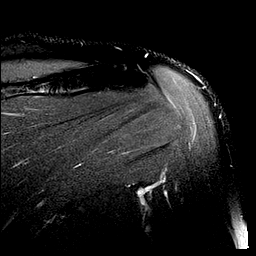
[im 22/22]
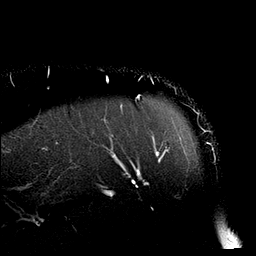

[Series 8: PD fat-sat · oblique · 4.0mm · 0.55mm/px · 7 of 22 slices shown (2 of 2)]
[im 1/22]
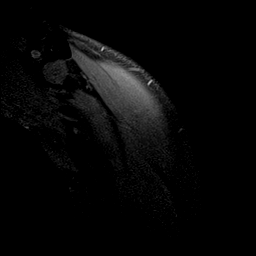
[im 4/22]
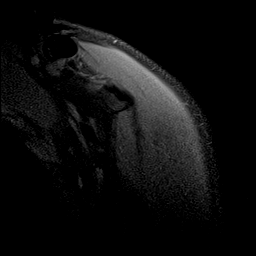
[im 8/22]
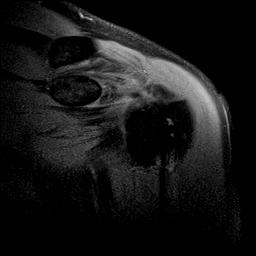
[im 11/22]
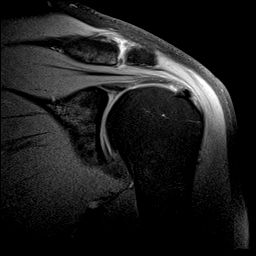
[im 15/22]
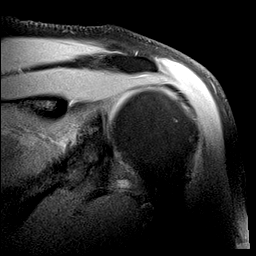
[im 18/22]
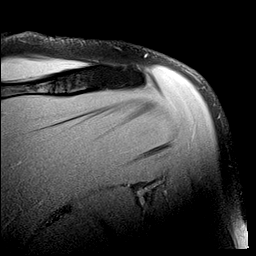
[im 22/22]
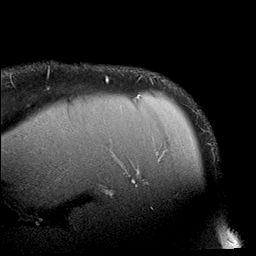

[Series 9: T1 · oblique · 4.0mm · 0.55mm/px · 7 of 22 slices shown]
[im 1/22]
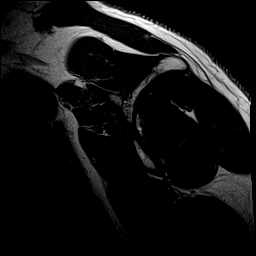
[im 4/22]
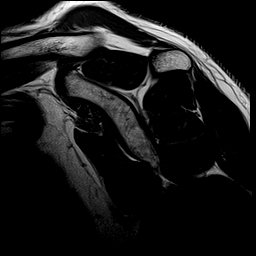
[im 8/22]
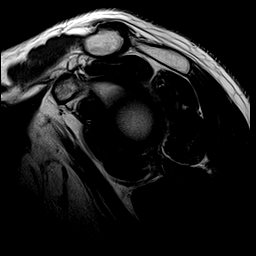
[im 11/22]
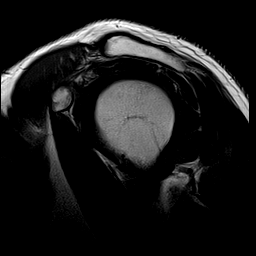
[im 15/22]
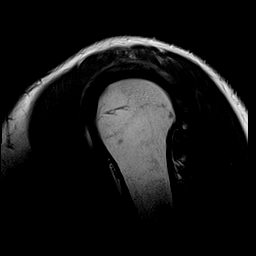
[im 18/22]
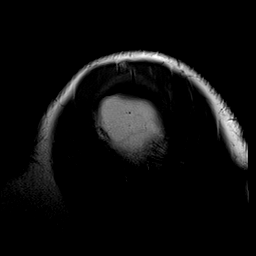
[im 22/22]
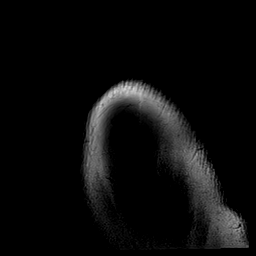

[40 of 40 positions shown; findings below may reference images not displayed]

FINDINGS: Rotator cuff: Mild supraspinatus tendinosis with a tiny low-grade
partial-thickness articular sided tear of the mid portion of the
distal tendon involving less than 25% of the tendon depth (series 7,
image 12). No full-thickness component. Infraspinatus,
subscapularis, and teres minor tendons intact.

Muscles: No atrophy or abnormal signal of the muscles of the rotator
cuff.

Biceps long head:  Intact.

Acromioclavicular Joint: Mild arthropathy of the acromioclavicular
joint. Trace subacromial-subdeltoid bursal fluid.

Glenohumeral Joint: No joint effusion. No chondral defect.

Labrum: Grossly intact, but evaluation is limited by lack of
intraarticular fluid.

Bones:  No marrow abnormality, fracture or dislocation.

Other: None.
IMPRESSION: 1. Mild supraspinatus tendinosis with tiny low-grade articular
surface tear. No full-thickness rotator cuff tear.
2. Minimal subacromial-subdeltoid bursal fluid suggesting bursitis.

## 2021-03-14 ENCOUNTER — Encounter: Payer: Self-pay | Admitting: Medical

## 2021-03-21 ENCOUNTER — Other Ambulatory Visit: Payer: Self-pay | Admitting: Physician Assistant

## 2021-03-22 ENCOUNTER — Ambulatory Visit: Payer: BC Managed Care – PPO | Admitting: Medical

## 2021-03-22 ENCOUNTER — Encounter: Payer: Self-pay | Admitting: Medical

## 2021-03-22 ENCOUNTER — Other Ambulatory Visit: Payer: Self-pay

## 2021-03-22 VITALS — BP 123/89 | HR 74 | Resp 18 | Ht 70.0 in | Wt 194.0 lb

## 2021-03-22 DIAGNOSIS — F3341 Major depressive disorder, recurrent, in partial remission: Secondary | ICD-10-CM | POA: Diagnosis not present

## 2021-03-22 DIAGNOSIS — I10 Essential (primary) hypertension: Secondary | ICD-10-CM | POA: Diagnosis not present

## 2021-03-22 DIAGNOSIS — G43909 Migraine, unspecified, not intractable, without status migrainosus: Secondary | ICD-10-CM

## 2021-03-22 DIAGNOSIS — Z125 Encounter for screening for malignant neoplasm of prostate: Secondary | ICD-10-CM | POA: Diagnosis not present

## 2021-03-22 DIAGNOSIS — Z23 Encounter for immunization: Secondary | ICD-10-CM | POA: Diagnosis not present

## 2021-03-22 DIAGNOSIS — Z1211 Encounter for screening for malignant neoplasm of colon: Secondary | ICD-10-CM

## 2021-03-22 DIAGNOSIS — E785 Hyperlipidemia, unspecified: Secondary | ICD-10-CM

## 2021-03-22 DIAGNOSIS — J4520 Mild intermittent asthma, uncomplicated: Secondary | ICD-10-CM

## 2021-03-22 LAB — PSA: PSA: 0.52 ng/mL (ref 0.10–4.00)

## 2021-03-22 LAB — COMPREHENSIVE METABOLIC PANEL
ALT: 24 U/L (ref 0–53)
AST: 21 U/L (ref 0–37)
Albumin: 4.4 g/dL (ref 3.5–5.2)
Alkaline Phosphatase: 57 U/L (ref 39–117)
BUN: 31 mg/dL — ABNORMAL HIGH (ref 6–23)
CO2: 29 mEq/L (ref 19–32)
Calcium: 9.5 mg/dL (ref 8.4–10.5)
Chloride: 105 mEq/L (ref 96–112)
Creatinine, Ser: 1.17 mg/dL (ref 0.40–1.50)
GFR: 72.58 mL/min (ref >60.00)
Glucose, Bld: 92 mg/dL (ref 70–99)
Potassium: 4.4 mEq/L (ref 3.5–5.1)
Sodium: 140 mEq/L (ref 135–145)
Total Bilirubin: 0.5 mg/dL (ref 0.2–1.2)
Total Protein: 6.7 g/dL (ref 6.0–8.3)

## 2021-03-22 LAB — LIPID PANEL
Cholesterol: 164 mg/dL (ref 0–200)
HDL: 38.9 mg/dL — ABNORMAL LOW (ref >39.00)
LDL Cholesterol: 88 mg/dL (ref 0–99)
NonHDL: 124.87
Total CHOL/HDL Ratio: 4
Triglycerides: 185 mg/dL — ABNORMAL HIGH (ref 0.0–149.0)
VLDL: 37 mg/dL (ref 0.0–40.0)

## 2021-03-22 NOTE — Addendum Note (Signed)
Addended by: Maximino Sarin on: 03/22/2021 09:23 AM   Modules accepted: Orders

## 2021-03-22 NOTE — Progress Notes (Signed)
Subjective:    Patient ID: George Ward, male    DOB: 02-18-1971, 50 y.o.   MRN: [id number]  HPI  Pt in for follow up.  Htn hx. Bp well controlled. Pt is on maxide.  Hx of high cholesterol. On Atorvastatin.  For depressive disorder has been seeing psychiatrist. Pt on prozac and seroquel. Pt also on ativan.   Migraine ha controlled. Has imitrex to use if needed. Recent got propanol for migraine prevention and no ha since. Stated prior to that ha were getting more frequent.     Review of Systems  Constitutional:  Negative for chills, fatigue and fever.  HENT:  Negative for congestion.   Respiratory:  Negative for cough, chest tightness, shortness of breath and wheezing.   Cardiovascular:  Negative for chest pain and palpitations.  Gastrointestinal:  Negative for abdominal distention, abdominal pain, blood in stool and nausea.  Genitourinary:  Negative for dysuria and flank pain.  Musculoskeletal:  Negative for back pain and myalgias.  Skin:  Negative for rash.  Neurological:  Negative for dizziness and headaches.  Hematological:  Negative for adenopathy. Does not bruise/bleed easily.  Psychiatric/Behavioral:  Positive for dysphoric mood. Negative for behavioral problems, decreased concentration and sleep disturbance. The patient is nervous/anxious.        Stable on meds.     Past Medical History:  Diagnosis Date   Alcohol abuse    Remission >20 years ago   Allergy    Anxiety    Asthma    Depression    MAJOR DEPRESSIVE DISORDER   Depression with anxiety    Drug abuse (HCC)    Remission >20 years ago   Encounter for HIV (human immunodeficiency virus) test 2014-2015   Environmental allergies    History of chicken pox    Hyperlipidemia    Hypertension    Hypoalphalipoproteinemia, familial    Lumbar pain    Migraines    Primary generalized (osteo)arthritis    Seasonal allergies      Social History   Socioeconomic History   Marital status: Married    Spouse  name: Not on file   Number of children: Not on file   Years of education: Not on file   Highest education level: Not on file  Occupational History   Not on file  Tobacco Use   Smoking status: Former    Pack years: 0.00    Types: Cigarettes    Quit date: 03/07/1988    Years since quitting: 33.0   Smokeless tobacco: Former  Building services engineer Use: Never used  Substance and Sexual Activity   Alcohol use: Not Currently   Drug use: Not Currently   Sexual activity: Yes  Other Topics Concern   Not on file  Social History Narrative   Not on file   Social Determinants of Health   Financial Resource Strain: Not on file  Food Insecurity: Not on file  Transportation Needs: Not on file  Physical Activity: Not on file  Stress: Not on file  Social Connections: Not on file  Intimate Partner Violence: Not on file    Past Surgical History:  Procedure Laterality Date   EXCISION MASS HEAD  03/17/2018   excision cells on head  ; still in testing for cancer per patient report    LUMBAR EPIDURAL INJECTION     UMBILICAL HERNIA REPAIR N/A 04/02/2018   Procedure: OPEN UMBILICAL HERNIA REPAIR WITH INSERTION OF MESH;  Surgeon: Andria Meuse, MD;  Location:  [facility];  Service: General;  Laterality: N/A;   WISDOM TOOTH EXTRACTION  1989-1990    Family History  Problem Relation Age of Onset   Hypertension Mother    Hyperlipidemia Father    Healthy Sister    Colon cancer Maternal Grandfather    Prostate cancer Maternal Grandfather    Congestive Heart Failure Maternal Grandfather    Dementia Paternal Grandmother    Heart disease Paternal Grandfather    Heart attack Paternal Grandfather    Hypertension Maternal Uncle    Hyperlipidemia Maternal Uncle    Hepatitis Paternal Aunt    Hepatitis C Paternal Aunt    Hypertension Paternal Uncle    Hyperlipidemia Paternal Uncle    Healthy Son        x1   Healthy Daughter        x4    Allergies  Allergen Reactions   Co-Trimoxazole  [Sulfamethoxazole-Trimethoprim] Nausea And Vomiting   Lisinopril Cough    With Wheezing    Sulfa Antibiotics Nausea And Vomiting    Current Outpatient Medications on File Prior to Visit  Medication Sig Dispense Refill   acetaminophen (TYLENOL) 500 MG tablet Take 1,000 mg by mouth daily as needed for moderate pain or headache.     albuterol (PROVENTIL HFA;VENTOLIN HFA) 108 (90 Base) MCG/ACT inhaler Inhale 1-2 puffs into the lungs every 6 (six) hours as needed for wheezing or shortness of breath. Ventolin     atorvastatin (LIPITOR) 40 MG tablet TAKE 1 TABLET BY MOUTH EVERY DAY 90 tablet 1   calcium carbonate (TUMS - DOSED IN MG ELEMENTAL CALCIUM) 500 MG chewable tablet Chew 2 tablets by mouth daily as needed for indigestion or heartburn.     diclofenac (VOLTAREN) 75 MG EC tablet TAKE 1 TABLET BY MOUTH TWICE A DAY 60 tablet 0   fenofibrate micronized (LOFIBRA) 67 MG capsule TAKE 1 CAPSULE (67 MG TOTAL) BY MOUTH DAILY BEFORE BREAKFAST. 90 capsule 3   ibuprofen (ADVIL,MOTRIN) 200 MG tablet Take 400 mg by mouth daily as needed for headache or moderate pain.     loratadine (CLARITIN) 10 MG tablet Take 10 mg by mouth daily as needed for allergies.     LORazepam (ATIVAN) 0.5 MG tablet Take 1 tablet (0.5 mg total) by mouth 2 (two) times daily as needed for anxiety. 60 tablet 1   Multiple Vitamin (MULTIVITAMIN WITH MINERALS) TABS tablet Take 1 tablet by mouth daily.     propranolol (INDERAL) 20 MG tablet Take 1 tablet (20 mg total) by mouth 2 (two) times daily. 60 tablet 0   SUMAtriptan (IMITREX) 50 MG tablet 1 tablet daily , May repeat in 2 hours if headache persists or recurs. 9 tablet 2   SYMBICORT 80-4.5 MCG/ACT inhaler TAKE 2 PUFFS BY MOUTH TWICE A DAY 10.2 each 3   tobramycin (TOBREX) 0.3 % ophthalmic solution Place 2 drops into the right eye every 6 (six) hours. 5 mL 0   triamterene-hydrochlorothiazide (MAXZIDE-25) 37.5-25 MG tablet TAKE 1 TABLET BY MOUTH EVERY DAY 90 tablet 1   FLUoxetine  (PROZAC) 40 MG capsule Take 2 capsules (80 mg total) by mouth daily. 180 capsule 1   QUEtiapine (SEROQUEL) 50 MG tablet Take 2 tablets (100 mg total) by mouth at bedtime. 180 tablet 1   zolpidem (AMBIEN) 10 MG tablet Take 1 tablet (10 mg total) by mouth at bedtime as needed for sleep. 30 tablet 1   No current facility-administered medications on file prior to visit.    BP 123/89  Pulse 74   Resp 18   Ht 5\' 10"  (1.778 m)   Wt 194 lb (88 kg)   SpO2 97%   BMI 27.84 kg/m       Objective:   Physical Exam General Mental Status- Alert. General Appearance- Not in acute distress.   Skin General: Color- Normal Color. Moisture- Normal Moisture.  Neck Carotid Arteries- Normal color. Moisture- Normal Moisture. No carotid bruits. No JVD.  Chest and Lung Exam Auscultation: Breath Sounds:-Normal.  Cardiovascular Auscultation:Rythm- Regular. Murmurs & Other Heart Sounds:Auscultation of the heart reveals- No Murmurs.  Abdomen Inspection:-Inspeection Normal. Palpation/Percussion:Note:No mass. Palpation and Percussion of the abdomen reveal- Non Tender, Non Distended + BS, no rebound or guarding.    Neurologic Cranial Nerve exam:- CN III-XII intact(No nystagmus), symmetric smile. trength:- 5/5 equal and symmetric strength both upper and lower extremities.        Assessment & Plan:  BP/htn well controlled continue maxide.  Migraine ha well controlled with propanolol and imitrex.  Depression/mood disorder and anxiet well controlled on current med regimen. Follow up with specialist.  For high cholesterol continue atorvastatin and low cholesterol diet. Repeat lipid panel and cmp today.  Refer for screening colonoscopy.  Screening psa placed.  Follow up date to be determined after lab review.  , PA-C

## 2021-03-22 NOTE — Patient Instructions (Addendum)
BP/htn well controlled continue maxide.  Migraine ha well controlled with propanolol and imitrex.  Depression/mood disorder and anxiet well controlled on current med regimen. Follow up with specialist.  For high cholesterol continue atorvastatin and low cholesterol diet. Repeat lipid panel and cmp today.  Refer for screening colonoscopy.  Screening psa placed.  Pcv 20 vaccine today.(Hx of asthma. Well controlled.)  Follow up date to be determined after lab review.

## 2021-03-30 ENCOUNTER — Other Ambulatory Visit: Payer: Self-pay | Admitting: Family

## 2021-04-07 ENCOUNTER — Other Ambulatory Visit: Payer: Self-pay | Admitting: Orthopaedic Surgery

## 2021-04-07 ENCOUNTER — Other Ambulatory Visit: Payer: Self-pay | Admitting: Medical

## 2021-04-07 NOTE — Telephone Encounter (Signed)
ok 

## 2021-05-23 ENCOUNTER — Other Ambulatory Visit: Payer: Self-pay | Admitting: Orthopaedic Surgery

## 2021-06-13 ENCOUNTER — Other Ambulatory Visit: Payer: Self-pay

## 2021-06-14 ENCOUNTER — Ambulatory Visit: Payer: BC Managed Care – PPO | Admitting: Medical

## 2021-06-14 VITALS — BP 120/80 | HR 67 | Temp 97.9°F | Resp 18 | Ht 70.0 in | Wt 203.6 lb

## 2021-06-14 DIAGNOSIS — R059 Cough, unspecified: Secondary | ICD-10-CM | POA: Diagnosis not present

## 2021-06-14 DIAGNOSIS — J309 Allergic rhinitis, unspecified: Secondary | ICD-10-CM | POA: Diagnosis not present

## 2021-06-14 DIAGNOSIS — I10 Essential (primary) hypertension: Secondary | ICD-10-CM

## 2021-06-14 DIAGNOSIS — J4520 Mild intermittent asthma, uncomplicated: Secondary | ICD-10-CM

## 2021-06-14 DIAGNOSIS — E785 Hyperlipidemia, unspecified: Secondary | ICD-10-CM

## 2021-06-14 LAB — LIPID PANEL
Cholesterol: 148 mg/dL (ref 0–200)
HDL: 36.2 mg/dL — ABNORMAL LOW (ref >39.00)
LDL Cholesterol: 81 mg/dL (ref 0–99)
NonHDL: 111.45
Total CHOL/HDL Ratio: 4
Triglycerides: 153 mg/dL — ABNORMAL HIGH (ref 0.0–149.0)
VLDL: 30.6 mg/dL (ref 0.0–40.0)

## 2021-06-14 LAB — COMPREHENSIVE METABOLIC PANEL
ALT: 27 U/L (ref 0–53)
AST: 26 U/L (ref 0–37)
Albumin: 4.3 g/dL (ref 3.5–5.2)
Alkaline Phosphatase: 64 U/L (ref 39–117)
BUN: 33 mg/dL — ABNORMAL HIGH (ref 6–23)
CO2: 28 mEq/L (ref 19–32)
Calcium: 9.1 mg/dL (ref 8.4–10.5)
Chloride: 105 mEq/L (ref 96–112)
Creatinine, Ser: 1.13 mg/dL (ref 0.40–1.50)
GFR: 75.55 mL/min (ref >60.00)
Glucose, Bld: 92 mg/dL (ref 70–99)
Potassium: 4.2 mEq/L (ref 3.5–5.1)
Sodium: 140 mEq/L (ref 135–145)
Total Bilirubin: 0.4 mg/dL (ref 0.2–1.2)
Total Protein: 6.6 g/dL (ref 6.0–8.3)

## 2021-06-14 MED ORDER — TOBRAMYCIN 0.3 % OP SOLN: 2.0000 [drp] | Freq: Four times a day (QID) | OPHTHALMIC | 0 refills | Status: DC

## 2021-06-14 MED ORDER — FLUTICASONE PROPIONATE 50 MCG/ACT NA SUSP: 2.0000 | Freq: Every day | NASAL | 1 refills | Status: DC

## 2021-06-14 MED ORDER — BENZONATATE 100 MG PO CAPS: 100.0000 mg | ORAL_CAPSULE | Freq: Three times a day (TID) | ORAL | 0 refills | Status: DC | PRN

## 2021-06-14 MED ORDER — PREDNISONE 10 MG (21) PO TBPK: ORAL_TABLET | ORAL | 0 refills | Status: DC

## 2021-06-14 NOTE — Patient Instructions (Addendum)
Dry cough cause probable allergy and asthma related. Continue current inhalers. Add tapered prednisone for 6 days. Rx benzonatate. For next 2-3 months consider flonase nasal spray for allergies. Continue montelukast.  If cough does not resolve over next week notify us and would place cxr order.  For rt eye early conjunctivitis rx tobrex to use if needed.  For high cholesterol check lipid panel. Continue current meds may modiy after lab review.  Bp controlled on recheck. Continue current bp meds.  Follow up 7-10 days or sooner if needed.

## 2021-06-14 NOTE — Progress Notes (Signed)
Subjective:    Patient ID: George Ward, male    DOB: [birth year], 50 y.o.   MRN: [id number]  HPI 2-3 weeks of dry. Pt states he some mild wheeze and sob. Walking 5-6 miles a day. He is using inhaler albuterol 2-3 times a day. Inhaler seems to help stop cough. He is also using his symbicort. No fever, no chills or sweats. He is using singulair.    Hx of some allergies throughout the year. Fall and spring more.   Rt eye mild crusting matting this morning.  Pt is also fasting today in event he needed labs. Pt on lipitor and fenofibrate.   Hx of htn. Bp initially high.    Review of Systems  Constitutional:  Negative for chills and fatigue.  HENT:  Positive for congestion. Negative for drooling, ear pain and postnasal drip.   Respiratory:  Positive for cough and wheezing. Negative for chest tightness and shortness of breath.   Cardiovascular:  Negative for chest pain and palpitations.  Gastrointestinal:  Negative for abdominal pain, constipation and rectal pain.  Musculoskeletal:  Negative for back pain, joint swelling and neck pain.  Skin:  Negative for rash.  Neurological:  Negative for dizziness, seizures, syncope, weakness and headaches.  Psychiatric/Behavioral:  Negative for behavioral problems.     Past Medical History:  Diagnosis Date   Alcohol abuse    Remission >20 years ago   Allergy    Anxiety    Asthma    Depression    MAJOR DEPRESSIVE DISORDER   Depression with anxiety    Drug abuse (HCC)    Remission >20 years ago   Encounter for HIV (human immunodeficiency virus) test 2014-2015   Environmental allergies    History of chicken pox    Hyperlipidemia    Hypertension    Hypoalphalipoproteinemia, familial    Lumbar pain    Migraines    Primary generalized (osteo)arthritis    Seasonal allergies      Social History   Socioeconomic History   Marital status: Married    Spouse name: Not on file   Number of children: Not on file   Years of education: Not  on file   Highest education level: Not on file  Occupational History   Not on file  Tobacco Use   Smoking status: Former    Types: Cigarettes    Quit date: 03/07/1988    Years since quitting: 33.2   Smokeless tobacco: Former  Building services engineer Use: Never used  Substance and Sexual Activity   Alcohol use: Not Currently   Drug use: Not Currently   Sexual activity: Yes  Other Topics Concern   Not on file  Social History Narrative   Not on file   Social Determinants of Health   Financial Resource Strain: Not on file  Food Insecurity: Not on file  Transportation Needs: Not on file  Physical Activity: Not on file  Stress: Not on file  Social Connections: Not on file  Intimate Partner Violence: Not on file    Past Surgical History:  Procedure Laterality Date   EXCISION MASS HEAD  03/17/2018   excision cells on head  ; still in testing for cancer per patient report    LUMBAR EPIDURAL INJECTION     UMBILICAL HERNIA REPAIR N/A 04/02/2018   Procedure: OPEN UMBILICAL HERNIA REPAIR WITH INSERTION OF MESH;  Surgeon: Andria Meuse, MD;  Location: [facility];  Service: General;  Laterality: N/A;   WISDOM  TOOTH EXTRACTION  1989-1990    Family History  Problem Relation Age of Onset   Hypertension Mother    Hyperlipidemia Father    Healthy Sister    Colon cancer Maternal Grandfather    Prostate cancer Maternal Grandfather    Congestive Heart Failure Maternal Grandfather    Dementia Paternal Grandmother    Heart disease Paternal Grandfather    Heart attack Paternal Grandfather    Hypertension Maternal Uncle    Hyperlipidemia Maternal Uncle    Hepatitis Paternal Aunt    Hepatitis C Paternal Aunt    Hypertension Paternal Uncle    Hyperlipidemia Paternal Uncle    Healthy Son        x1   Healthy Daughter        x4    Allergies  Allergen Reactions   Co-Trimoxazole [Sulfamethoxazole-Trimethoprim] Nausea And Vomiting   Lisinopril Cough    With Wheezing    Sulfa  Antibiotics Nausea And Vomiting    Current Outpatient Medications on File Prior to Visit  Medication Sig Dispense Refill   acetaminophen (TYLENOL) 500 MG tablet Take 1,000 mg by mouth daily as needed for moderate pain or headache.     albuterol (PROVENTIL HFA;VENTOLIN HFA) 108 (90 Base) MCG/ACT inhaler Inhale 1-2 puffs into the lungs every 6 (six) hours as needed for wheezing or shortness of breath. Ventolin     atorvastatin (LIPITOR) 40 MG tablet TAKE 1 TABLET BY MOUTH EVERY DAY 90 tablet 1   calcium carbonate (TUMS - DOSED IN MG ELEMENTAL CALCIUM) 500 MG chewable tablet Chew 2 tablets by mouth daily as needed for indigestion or heartburn.     diclofenac (VOLTAREN) 75 MG EC tablet TAKE 1 TABLET BY MOUTH TWICE A DAY 60 tablet 0   fenofibrate micronized (LOFIBRA) 67 MG capsule TAKE 1 CAPSULE (67 MG TOTAL) BY MOUTH DAILY BEFORE BREAKFAST. 90 capsule 3   ibuprofen (ADVIL,MOTRIN) 200 MG tablet Take 400 mg by mouth daily as needed for headache or moderate pain.     loratadine (CLARITIN) 10 MG tablet Take 10 mg by mouth daily as needed for allergies.     LORazepam (ATIVAN) 0.5 MG tablet Take 1 tablet (0.5 mg total) by mouth 2 (two) times daily as needed for anxiety. 60 tablet 1   Multiple Vitamin (MULTIVITAMIN WITH MINERALS) TABS tablet Take 1 tablet by mouth daily.     propranolol (INDERAL) 20 MG tablet Take 1 tablet (20 mg total) by mouth 2 (two) times daily. 180 tablet 1   SUMAtriptan (IMITREX) 50 MG tablet 1 tablet daily , May repeat in 2 hours if headache persists or recurs. 9 tablet 2   SYMBICORT 80-4.5 MCG/ACT inhaler TAKE 2 PUFFS BY MOUTH TWICE A DAY 10.2 each 3   tobramycin (TOBREX) 0.3 % ophthalmic solution Place 2 drops into the right eye every 6 (six) hours. 5 mL 0   triamterene-hydrochlorothiazide (MAXZIDE-25) 37.5-25 MG tablet TAKE 1 TABLET BY MOUTH EVERY DAY 90 tablet 1   FLUoxetine (PROZAC) 40 MG capsule Take 2 capsules (80 mg total) by mouth daily. 180 capsule 1   QUEtiapine  (SEROQUEL) 50 MG tablet Take 2 tablets (100 mg total) by mouth at bedtime. 180 tablet 1   zolpidem (AMBIEN) 10 MG tablet Take 1 tablet (10 mg total) by mouth at bedtime as needed for sleep. 30 tablet 1   No current facility-administered medications on file prior to visit.    BP (!) 142/80   Pulse 67   Temp 97.9 F (36.6 C)  Resp 18   Ht 5\' 10"  (1.778 m)   Wt 203 lb 9.6 oz (92.4 kg)   SpO2 99%   BMI 29.21 kg/m       Objective:   Physical Exam  General- No acute distress. Pleasant patient. Neck- Full range of motion, no jvd Lungs- Clear, even and unlabored. Heart- regular rate and rhythm. Neurologic- CNII- XII grossly intact.  Heent- no sinus pressure. Boggy turbinates mild. +pnd. Eyes. Peerl. No dc presently. Rt ear- canal clear and normal tm. Left ear- canal clear and normal tm.  Lower ext- no pedal edema.      Assessment & Plan:   Patient Instructions  Dry cough cause probable allergy and asthma related. Continue current inhalers. Add tapered prednisone for 6 days. Rx benzonatate. For next 2-3 months consider flonase nasal spray for allergies. Continue montelukast.  If cough does not resolve over next week notify and would place cxr order.  Follow up 7-10 days or sooner if needed.

## 2021-06-19 ENCOUNTER — Encounter: Payer: Self-pay | Admitting: Medical

## 2021-06-30 ENCOUNTER — Encounter: Payer: Self-pay | Admitting: Medical

## 2021-07-06 ENCOUNTER — Other Ambulatory Visit: Payer: Self-pay | Admitting: Medical

## 2021-07-06 ENCOUNTER — Ambulatory Visit: Payer: BC Managed Care – PPO

## 2021-07-11 ENCOUNTER — Other Ambulatory Visit: Payer: Self-pay | Admitting: Medical

## 2021-07-18 ENCOUNTER — Ambulatory Visit: Payer: BC Managed Care – PPO | Attending: Internal Medicine

## 2021-07-18 ENCOUNTER — Other Ambulatory Visit (HOSPITAL_BASED_OUTPATIENT_CLINIC_OR_DEPARTMENT_OTHER): Payer: Self-pay

## 2021-07-18 DIAGNOSIS — Z23 Encounter for immunization: Secondary | ICD-10-CM

## 2021-07-18 MED ORDER — INFLUENZA VAC SPLIT QUAD 0.5 ML IM SUSY
PREFILLED_SYRINGE | INTRAMUSCULAR | 0 refills | Status: DC
  Filled 2021-07-18: qty 0.5, 1d supply, fill #0

## 2021-07-18 NOTE — Progress Notes (Signed)
   Cov[facility] Vaccination [facility]  Name:  RAWSON MINIX    MRN: [id number] DOB: 01/15/1971  07/18/2021  Mr. Champine was observed post Covid-19 immunization for 15 minutes without incident. He was provided with Vaccine Information Sheet and instruction to access the V-Safe system.   Mr. Hellberg was instructed to call 911 with any severe reactions post vaccine: Difficulty breathing  Swelling of face and throat  A fast heartbeat  A bad rash all over body  Dizziness and weakness   Immunizations Administered     Name Date Dose VIS Date Route   Pfizer Covid-19 Vaccine Bivalent Booster 07/18/2021  9:11 AM 0.3 mL 05/10/2021 Intramuscular   Manufacturer: [employer], [employer]   Lot: [id number]   NDC: [phone]

## 2021-08-02 ENCOUNTER — Other Ambulatory Visit: Payer: Self-pay | Admitting: Medical

## 2021-08-05 ENCOUNTER — Other Ambulatory Visit: Payer: Self-pay | Admitting: Medical

## 2021-08-07 ENCOUNTER — Other Ambulatory Visit (HOSPITAL_BASED_OUTPATIENT_CLINIC_OR_DEPARTMENT_OTHER): Payer: Self-pay

## 2021-08-07 MED ORDER — PFIZER COVID-19 VAC BIVALENT 30 MCG/0.3ML IM SUSP
INTRAMUSCULAR | 0 refills | Status: DC
  Filled 2021-08-07: qty 0.3, 1d supply, fill #0

## 2021-08-23 ENCOUNTER — Encounter: Payer: Self-pay | Admitting: Medical

## 2021-08-31 ENCOUNTER — Other Ambulatory Visit: Payer: Self-pay | Admitting: Medical

## 2021-09-15 ENCOUNTER — Encounter: Payer: Self-pay | Admitting: Medical

## 2021-09-23 ENCOUNTER — Other Ambulatory Visit: Payer: Self-pay | Admitting: Medical

## 2021-09-25 ENCOUNTER — Ambulatory Visit (HOSPITAL_BASED_OUTPATIENT_CLINIC_OR_DEPARTMENT_OTHER)
Admission: RE | Admit: 2021-09-25 | Discharge: 2021-09-25 | Disposition: A | Discharge status: Home / Self Care | Payer: BC Managed Care – PPO | Source: Ambulatory Visit | Attending: Medical | Admitting: Medical

## 2021-09-25 ENCOUNTER — Other Ambulatory Visit: Payer: Self-pay

## 2021-09-25 ENCOUNTER — Ambulatory Visit: Payer: BC Managed Care – PPO | Admitting: Medical

## 2021-09-25 VITALS — BP 146/84 | HR 80 | Temp 98.0°F | Resp 18 | Ht 70.0 in | Wt 208.0 lb

## 2021-09-25 DIAGNOSIS — R059 Cough, unspecified: Secondary | ICD-10-CM | POA: Insufficient documentation

## 2021-09-25 DIAGNOSIS — J986 Disorders of diaphragm: Secondary | ICD-10-CM | POA: Diagnosis not present

## 2021-09-25 DIAGNOSIS — Z87891 Personal history of nicotine dependence: Secondary | ICD-10-CM

## 2021-09-25 DIAGNOSIS — R5383 Other fatigue: Secondary | ICD-10-CM | POA: Diagnosis present

## 2021-09-25 DIAGNOSIS — R6882 Decreased libido: Secondary | ICD-10-CM

## 2021-09-25 DIAGNOSIS — J40 Bronchitis, not specified as acute or chronic: Secondary | ICD-10-CM

## 2021-09-25 DIAGNOSIS — I2510 Atherosclerotic heart disease of native coronary artery without angina pectoris: Secondary | ICD-10-CM

## 2021-09-25 MED ORDER — HYDROCODONE BIT-HOMATROP MBR 5-1.5 MG/5ML PO SOLN: 5.0000 mL | Freq: Three times a day (TID) | ORAL | 0 refills | Status: DC | PRN

## 2021-09-25 MED ORDER — AZITHROMYCIN 250 MG PO TABS: ORAL_TABLET | ORAL | 0 refills | Status: AC

## 2021-09-25 MED ORDER — PREDNISONE 10 MG (21) PO TBPK: ORAL_TABLET | ORAL | 0 refills | Status: DC

## 2021-09-25 NOTE — Addendum Note (Signed)
Addended by: Anabel Halon on: 09/25/2021 09:06 PM   Modules accepted: Orders

## 2021-09-25 NOTE — Patient Instructions (Signed)
3-4 weeks of bronchitis vs pneumonia type symptoms. Will get cxr and cbc today. Mild sinus pressure as well. Rx zpack antibiotic and hycodan for cough.  Fatigue may be from the above but decided to expand fatigue work up with cmp, tsh, t4, b12, b1 and vit D.  For wheezing continue symbicort, albuterol and add on 6 day taper dose pack.  Follow up date to 1-2 weeks or sooner if needed.

## 2021-09-25 NOTE — Progress Notes (Signed)
Subjective:    Patient ID: George Ward, male    DOB: 28-Aug-1971, 51 y.o.   MRN: [id number]  HPI Pt in states for about one month he has been feeling fatigued.    He mentions about one month ago he was in [place] and dx with uri but given doxcyycline 100 mg bid x 5 days , cough promethazine  med and gave prednisone 20 mg bid x 5 days.   Cough is still lingering.  In [place] they did not get chest xays.  Remote light smoker for 2 years in highschool only social smoker on weekends with friends.  Some nasal congestion and sinus pressure.  Pt tested himself for covid before he went to MD in [place].  Pt has cough that is keeping him up at night.  Review of Systems  Constitutional:  Positive for chills and fatigue. Negative for fever.  HENT:  Positive for congestion and postnasal drip. Negative for ear pain.   Respiratory:  Positive for cough and wheezing. Negative for chest tightness.        Using his symbicort 2 inh twice a day.   Using albuterol couple of time during the day.  Cardiovascular:  Negative for chest pain and palpitations.  Gastrointestinal:  Negative for abdominal pain.  Musculoskeletal:  Negative for back pain and myalgias.  Neurological:  Negative for dizziness, seizures, syncope, weakness and headaches.  Hematological:  Negative for adenopathy. Does not bruise/bleed easily.  Psychiatric/Behavioral:  Negative for behavioral problems and confusion.     Past Medical History:  Diagnosis Date   Alcohol abuse    Remission >20 years ago   Allergy    Anxiety    Asthma    Depression    MAJOR DEPRESSIVE DISORDER   Depression with anxiety    Drug abuse (HCC)    Remission >20 years ago   Encounter for HIV (human immunodeficiency virus) test 2014-2015   Environmental allergies    History of chicken pox    Hyperlipidemia    Hypertension    Hypoalphalipoproteinemia, familial    Lumbar pain    Migraines    Primary generalized (osteo)arthritis     Seasonal allergies      Social History   Socioeconomic History   Marital status: Married    Spouse name: Not on file   Number of children: Not on file   Years of education: Not on file   Highest education level: Not on file  Occupational History   Not on file  Tobacco Use   Smoking status: Former    Types: Cigarettes    Quit date: 03/07/1988    Years since quitting: 33.5   Smokeless tobacco: Former  Building services engineer Use: Never used  Substance and Sexual Activity   Alcohol use: Not Currently   Drug use: Not Currently   Sexual activity: Yes  Other Topics Concern   Not on file  Social History Narrative   Not on file   Social Determinants of Health   Financial Resource Strain: Not on file  Food Insecurity: Not on file  Transportation Needs: Not on file  Physical Activity: Not on file  Stress: Not on file  Social Connections: Not on file  Intimate Partner Violence: Not on file    Past Surgical History:  Procedure Laterality Date   EXCISION MASS HEAD  03/17/2018   excision cells on head  ; still in testing for cancer per patient report    LUMBAR EPIDURAL INJECTION  UMBILICAL HERNIA REPAIR N/A 04/02/2018   Procedure: OPEN UMBILICAL HERNIA REPAIR WITH INSERTION OF MESH;  Surgeon: Andria MeuseWhite, Christopher M, MD;  Location: [facility];  Service: General;  Laterality: N/A;   WISDOM TOOTH EXTRACTION  1989-1990    Family History  Problem Relation Age of Onset   Hypertension Mother    Hyperlipidemia Father    Healthy Sister    Colon cancer Maternal Grandfather    Prostate cancer Maternal Grandfather    Congestive Heart Failure Maternal Grandfather    Dementia Paternal Grandmother    Heart disease Paternal Grandfather    Heart attack Paternal Grandfather    Hypertension Maternal Uncle    Hyperlipidemia Maternal Uncle    Hepatitis Paternal Aunt    Hepatitis C Paternal Aunt    Hypertension Paternal Uncle    Hyperlipidemia Paternal Uncle    Healthy Son        x1    Healthy Daughter        x4    Allergies  Allergen Reactions   Co-Trimoxazole [Sulfamethoxazole-Trimethoprim] Nausea And Vomiting   Lisinopril Cough    With Wheezing    Sulfa Antibiotics Nausea And Vomiting    Current Outpatient Medications on File Prior to Visit  Medication Sig Dispense Refill   acetaminophen (TYLENOL) 500 MG tablet Take 1,000 mg by mouth daily as needed for moderate pain or headache.     albuterol (PROVENTIL HFA;VENTOLIN HFA) 108 (90 Base) MCG/ACT inhaler Inhale 1-2 puffs into the lungs every 6 (six) hours as needed for wheezing or shortness of breath. Ventolin     atorvastatin (LIPITOR) 40 MG tablet TAKE 1 TABLET BY MOUTH EVERY DAY 90 tablet 1   calcium carbonate (TUMS - DOSED IN MG ELEMENTAL CALCIUM) 500 MG chewable tablet Chew 2 tablets by mouth daily as needed for indigestion or heartburn.     COVID-19 mRNA bivalent vaccine, Pfizer, (PFIZER COVID-19 VAC BIVALENT) injection Inject into the muscle. 0.3 mL 0   fenofibrate micronized (LOFIBRA) 67 MG capsule TAKE 1 CAPSULE (67 MG TOTAL) BY MOUTH DAILY BEFORE BREAKFAST. 90 capsule 3   fluticasone (FLONASE) 50 MCG/ACT nasal spray SPRAY 2 SPRAYS INTO EACH NOSTRIL EVERY DAY 16 mL 1   ibuprofen (ADVIL,MOTRIN) 200 MG tablet Take 400 mg by mouth daily as needed for headache or moderate pain.     influenza vac split quadrivalent PF (FLUARIX) 0.5 ML injection Inject into the muscle. 0.5 mL 0   loratadine (CLARITIN) 10 MG tablet Take 10 mg by mouth daily as needed for allergies.     LORazepam (ATIVAN) 0.5 MG tablet Take 1 tablet (0.5 mg total) by mouth 2 (two) times daily as needed for anxiety. 60 tablet 1   Multiple Vitamin (MULTIVITAMIN WITH MINERALS) TABS tablet Take 1 tablet by mouth daily.     predniSONE (STERAPRED UNI-PAK 21 TAB) 10 MG (21) TBPK tablet Taper over 6 days. 21 tablet 0   propranolol (INDERAL) 20 MG tablet TAKE 1 TABLET BY MOUTH TWICE A DAY 180 tablet 1   SUMAtriptan (IMITREX) 50 MG tablet 1 TABLET DAILY , MAY  REPEAT IN 2 HOURS IF HEADACHE PERSISTS OR RECURS. 9 tablet 2   SYMBICORT 80-4.5 MCG/ACT inhaler TAKE 2 PUFFS BY MOUTH TWICE A DAY 10.2 each 3   tobramycin (TOBREX) 0.3 % ophthalmic solution Place 2 drops into the right eye every 6 (six) hours. 5 mL 0   triamterene-hydrochlorothiazide (MAXZIDE-25) 37.5-25 MG tablet TAKE 1 TABLET BY MOUTH EVERY DAY 90 tablet 1   FLUoxetine (PROZAC)  40 MG capsule Take 2 capsules (80 mg total) by mouth daily. 180 capsule 1   QUEtiapine (SEROQUEL) 50 MG tablet Take 2 tablets (100 mg total) by mouth at bedtime. 180 tablet 1   zolpidem (AMBIEN) 10 MG tablet Take 1 tablet (10 mg total) by mouth at bedtime as needed for sleep. 30 tablet 1   No current facility-administered medications on file prior to visit.    BP (!) 146/84    Pulse 80    Temp 98 F (36.7 C)    Resp 18    Ht 5\' 10"  (1.778 m)    Wt 208 lb (94.3 kg)    SpO2 97%    BMI 29.84 kg/m       Objective:   Physical Exam  General Mental Status- Alert. General Appearance- Not in acute distress.   Skin General: Color- Normal Color. Moisture- Normal Moisture.  Neck Carotid Arteries- Normal color. Moisture- Normal Moisture. No carotid bruits. No JVD.  Chest and Lung Exam Auscultation: Breath Sounds:-Normal.  Heent- mild maxillary sinus pressure.  Cardiovascular Auscultation:Rythm- Regular. Murmurs & Other Heart Sounds:Auscultation of the heart reveals- No Murmurs.  Abdomen Inspection:-Inspeection Normal. Palpation/Percussion:Note:No mass. Palpation and Percussion of the abdomen reveal- Non Tender, Non Distended + BS, no rebound or guarding.   Neurologic Cranial Nerve exam:- CN III-XII intact(No nystagmus), symmetric smile. Drift Test:- No drift. Strength:- 5/5 equal and symmetric strength both upper and lower extremities.        Assessment & Plan:   Patient Instructions  3-4 weeks of bronchitis vs pneumonia type symptoms. Will get cxr and cbc today. Mild sinus pressure as well. Rx zpack  antibiotic and hycodan for cough.  Fatigue may be from the above but decided to expand fatigue work up with cmp, tsh, t4, b12, b1 and vit D.  For wheezing continue symbicort, albuterol and add on 6 day taper dose pack.  Follow up date to 1-2 weeks or sooner if needed.    , PA-C

## 2021-09-26 ENCOUNTER — Encounter: Payer: Self-pay | Admitting: Medical

## 2021-09-26 LAB — CBC WITH DIFFERENTIAL/PLATELET
Basophils Absolute: 0.1 10*3/uL (ref 0.0–0.1)
Basophils Relative: 1 % (ref 0.0–3.0)
Eosinophils Absolute: 0.3 10*3/uL (ref 0.0–0.7)
Eosinophils Relative: 2.8 % (ref 0.0–5.0)
HCT: 39.1 % (ref 39.0–52.0)
Hemoglobin: 12.6 g/dL — ABNORMAL LOW (ref 13.0–17.0)
Lymphocytes Relative: 22.7 % (ref 12.0–46.0)
Lymphs Abs: 2.3 10*3/uL (ref 0.7–4.0)
MCHC: 32.1 g/dL (ref 30.0–36.0)
MCV: 80.1 fl (ref 78.0–100.0)
Monocytes Absolute: 1.5 10*3/uL — ABNORMAL HIGH (ref 0.1–1.0)
Monocytes Relative: 14.4 % — ABNORMAL HIGH (ref 3.0–12.0)
Neutro Abs: 6 10*3/uL (ref 1.4–7.7)
Neutrophils Relative %: 59.1 % (ref 43.0–77.0)
Platelets: 258 10*3/uL (ref 150.0–400.0)
RBC: 4.89 Mil/uL (ref 4.22–5.81)
RDW: 15.3 % (ref 11.5–15.5)
WBC: 10.2 10*3/uL (ref 4.0–10.5)

## 2021-09-26 LAB — COMPREHENSIVE METABOLIC PANEL
ALT: 20 U/L (ref 0–53)
AST: 17 U/L (ref 0–37)
Albumin: 4.2 g/dL (ref 3.5–5.2)
Alkaline Phosphatase: 76 U/L (ref 39–117)
BUN: 18 mg/dL (ref 6–23)
CO2: 29 mEq/L (ref 19–32)
Calcium: 9.1 mg/dL (ref 8.4–10.5)
Chloride: 104 mEq/L (ref 96–112)
Creatinine, Ser: 1.15 mg/dL (ref 0.40–1.50)
GFR: 73.83 mL/min (ref >60.00)
Glucose, Bld: 88 mg/dL (ref 70–99)
Potassium: 4.1 mEq/L (ref 3.5–5.1)
Sodium: 141 mEq/L (ref 135–145)
Total Bilirubin: 0.6 mg/dL (ref 0.2–1.2)
Total Protein: 6.8 g/dL (ref 6.0–8.3)

## 2021-09-26 LAB — TSH: TSH: 0.5 u[IU]/mL (ref 0.35–5.50)

## 2021-09-26 LAB — T4, FREE: Free T4: 0.91 ng/dL (ref 0.60–1.60)

## 2021-09-26 LAB — VITAMIN B12: Vitamin B-12: 713 pg/mL (ref 211–911)

## 2021-09-27 ENCOUNTER — Encounter: Payer: Self-pay | Admitting: Medical

## 2021-09-27 LAB — VITAMIN D 1,25 DIHYDROXY
Vitamin D 1, 25 (OH)2 Total: 35 pg/mL (ref 18–72)
Vitamin D2 1, 25 (OH)2: <8 pg/mL
Vitamin D3 1, 25 (OH)2: 35 pg/mL

## 2021-09-27 NOTE — Addendum Note (Signed)
Addended by: Anabel Halon on: 09/27/2021 12:58 PM   Modules accepted: Orders

## 2021-09-29 LAB — VITAMIN B1: Vitamin B1 (Thiamine): 24 nmol/L (ref 8–30)

## 2021-10-03 ENCOUNTER — Other Ambulatory Visit: Payer: Self-pay | Admitting: Medical

## 2021-10-06 ENCOUNTER — Other Ambulatory Visit (INDEPENDENT_AMBULATORY_CARE_PROVIDER_SITE_OTHER): Payer: BC Managed Care – PPO

## 2021-10-06 DIAGNOSIS — R6882 Decreased libido: Secondary | ICD-10-CM

## 2021-10-06 DIAGNOSIS — R5383 Other fatigue: Secondary | ICD-10-CM

## 2021-10-07 LAB — TESTOSTERONE TOTAL,FREE,BIO, MALES
Albumin: 3.9 g/dL (ref 3.6–5.1)
Sex Hormone Binding: 32 nmol/L (ref 10–50)
Testosterone, Bioavailable: 102.9 ng/dL — ABNORMAL LOW (ref 110.0–575.0)
Testosterone, Free: 57.3 pg/mL (ref 46.0–224.0)
Testosterone: 409 ng/dL (ref 250–827)

## 2021-10-08 ENCOUNTER — Other Ambulatory Visit: Payer: Self-pay | Admitting: Medical

## 2021-10-11 ENCOUNTER — Other Ambulatory Visit: Payer: Self-pay

## 2021-10-11 ENCOUNTER — Encounter: Payer: Self-pay | Admitting: Pulmonary Disease

## 2021-10-11 ENCOUNTER — Ambulatory Visit: Payer: BC Managed Care – PPO | Admitting: Pulmonary Disease

## 2021-10-11 VITALS — BP 128/82 | HR 71 | Ht 70.0 in | Wt 208.0 lb

## 2021-10-11 DIAGNOSIS — R052 Subacute cough: Secondary | ICD-10-CM

## 2021-10-11 DIAGNOSIS — J4530 Mild persistent asthma, uncomplicated: Secondary | ICD-10-CM

## 2021-10-11 DIAGNOSIS — J986 Disorders of diaphragm: Secondary | ICD-10-CM | POA: Diagnosis not present

## 2021-10-11 MED ORDER — BUDESONIDE-FORMOTEROL FUMARATE 160-4.5 MCG/ACT IN AERO: 2.0000 | INHALATION_SPRAY | Freq: Two times a day (BID) | RESPIRATORY_TRACT | 6 refills | Status: DC

## 2021-10-11 NOTE — Progress Notes (Addendum)
Synopsis: Referred in January 2023 for cough by Esperanza RichtersEdward Saguier, PA  Subjective:   PATIENT ID: George Ward GENDER: male DOB: [birth year], MRN: [id number]  HPI  Chief Complaint  Patient presents with   Consult    Referred by PCP for cough, fatigue and SOB for the past 4 weeks. Described the cough as non-productive.    George Ward is a 51 year old male, former smoker with history of asthma who is referred to pulmonary clinic for cough.   He reports having cough since December. He [place] where he was treated with doxycyline, prednisone and PRN albuterol with some relief. He was then evaluated by his PCP on 09/25/20 where he was treated with azithromycin and prednisone taper. He had laboratory workup for fatigue at that time as well. Chest x-ray from 1/16 shows elevated left hemidiaphragm that is new compared to 05/2020. He complains of his heart racing at times and has intermittent chills.  He continues to have cough that is occasionally productive. He has sinus congestion with post-nasal drainage. He has intermittent reflux, but feels this is not a major issue at this time. He has seasonal allergies and takes claritin daily.   He is a former smoker, smoked in high school and had second hand smoke from his father. 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  He had left shoulder surgery on 10/22/19 without issues. He has chronic lumbar back issues. No neck pain.   Past Medical History:  Diagnosis Date   Alcohol abuse    Remission >20 years ago   Allergy    Anxiety    Asthma    Depression    MAJOR DEPRESSIVE DISORDER   Depression with anxiety    Drug abuse (HCC)    Remission >20 years ago   Encounter for HIV (human immunodeficiency virus) test 2014-2015   Environmental allergies    History of chicken pox    Hyperlipidemia    Hypertension    Hypoalphalipoproteinemia, familial    Lumbar pain     Migraines    Primary generalized (osteo)arthritis    Seasonal allergies      Family History  Problem Relation Age of Onset   Hypertension Mother    Hyperlipidemia Father    Healthy Sister    Colon cancer Maternal Grandfather    Prostate cancer Maternal Grandfather    Congestive Heart Failure Maternal Grandfather    Dementia Paternal Grandmother    Heart disease Paternal Grandfather    Heart attack Paternal Grandfather    Hypertension Maternal Uncle    Hyperlipidemia Maternal Uncle    Hepatitis Paternal Aunt    Hepatitis C Paternal Aunt    Hypertension Paternal Uncle    Hyperlipidemia Paternal Uncle    Healthy Son        x1   Healthy Daughter        x4     Social History   Socioeconomic History   Marital status: Married    Spouse name: Not on file   Number of children: Not on file   Years of education: Not on file   Highest education level: Not on file  Occupational History   Not on file  Tobacco Use   Smoking status: Former    Types: Cigarettes    Quit date: 03/07/1988    Years since quitting: 33.6   Smokeless tobacco: Former  Advertising account plannerVaping Use  Vaping Use: Never used  Substance and Sexual Activity   Alcohol use: Not Currently   Drug use: Not Currently   Sexual activity: Yes  Other Topics Concern   Not on file  Social History Narrative   Not on file   Social Determinants of Health   Financial Resource Strain: Not on file  Food Insecurity: Not on file  Transportation Needs: Not on file  Physical Activity: Not on file  Stress: Not on file  Social Connections: Not on file  Intimate Partner Violence: Not on file     Allergies  Allergen Reactions   Co-Trimoxazole [Sulfamethoxazole-Trimethoprim] Nausea And Vomiting   Lisinopril Cough    With Wheezing    Sulfa Antibiotics Nausea And Vomiting     Outpatient Medications Prior to Visit  Medication Sig Dispense Refill   acetaminophen (TYLENOL) 500 MG tablet Take 1,000 mg by mouth daily as needed for  moderate pain or headache.     albuterol (PROVENTIL HFA;VENTOLIN HFA) 108 (90 Base) MCG/ACT inhaler Inhale 1-2 puffs into the lungs every 6 (six) hours as needed for wheezing or shortness of breath. Ventolin     atorvastatin (LIPITOR) 40 MG tablet TAKE 1 TABLET BY MOUTH EVERY DAY 90 tablet 1   calcium carbonate (TUMS - DOSED IN MG ELEMENTAL CALCIUM) 500 MG chewable tablet Chew 2 tablets by mouth daily as needed for indigestion or heartburn.     COVID-19 mRNA bivalent vaccine, Pfizer, (PFIZER COVID-19 VAC BIVALENT) injection Inject into the muscle. 0.3 mL 0   fenofibrate micronized (LOFIBRA) 67 MG capsule TAKE 1 CAPSULE (67 MG TOTAL) BY MOUTH DAILY BEFORE BREAKFAST. 90 capsule 3   fluticasone (FLONASE) 50 MCG/ACT nasal spray SPRAY 2 SPRAYS INTO EACH NOSTRIL EVERY DAY 16 mL 1   HYDROcodone bit-homatropine (HYCODAN) 5-1.5 MG/5ML syrup Take 5 mLs by mouth every 8 (eight) hours as needed for cough. 120 mL 0   ibuprofen (ADVIL,MOTRIN) 200 MG tablet Take 400 mg by mouth daily as needed for headache or moderate pain.     influenza vac split quadrivalent PF (FLUARIX) 0.5 ML injection Inject into the muscle. 0.5 mL 0   loratadine (CLARITIN) 10 MG tablet Take 10 mg by mouth daily as needed for allergies.     LORazepam (ATIVAN) 0.5 MG tablet Take 1 tablet (0.5 mg total) by mouth 2 (two) times daily as needed for anxiety. 60 tablet 1   Multiple Vitamin (MULTIVITAMIN WITH MINERALS) TABS tablet Take 1 tablet by mouth daily.     propranolol (INDERAL) 20 MG tablet TAKE 1 TABLET BY MOUTH TWICE A DAY 180 tablet 1   SUMAtriptan (IMITREX) 50 MG tablet 1 TABLET DAILY , MAY REPEAT IN 2 HOURS IF HEADACHE PERSISTS OR RECURS. 9 tablet 2   tobramycin (TOBREX) 0.3 % ophthalmic solution Place 2 drops into the right eye every 6 (six) hours. 5 mL 0   triamterene-hydrochlorothiazide (MAXZIDE-25) 37.5-25 MG tablet TAKE 1 TABLET BY MOUTH EVERY DAY 90 tablet 1   predniSONE (STERAPRED UNI-PAK 21 TAB) 10 MG (21) TBPK tablet Taper over  6 days. 21 tablet 0   SYMBICORT 80-4.5 MCG/ACT inhaler TAKE 2 PUFFS BY MOUTH TWICE A DAY 10.2 each 3   FLUoxetine (PROZAC) 40 MG capsule Take 2 capsules (80 mg total) by mouth daily. 180 capsule 1   QUEtiapine (SEROQUEL) 50 MG tablet Take 2 tablets (100 mg total) by mouth at bedtime. 180 tablet 1   zolpidem (AMBIEN) 10 MG tablet Take 1 tablet (10 mg total) by mouth at  bedtime as needed for sleep. 30 tablet 1   No facility-administered medications prior to visit.   Review of Systems  Constitutional:  Negative for chills, fever, malaise/fatigue and weight loss.  HENT:  Positive for congestion. Negative for sore throat.   Eyes: Negative.   Respiratory:  Positive for cough and sputum production. Negative for hemoptysis, shortness of breath and wheezing.   Cardiovascular:  Positive for chest pain. Negative for palpitations, orthopnea, claudication and leg swelling.  Gastrointestinal:  Positive for heartburn. Negative for abdominal pain, nausea and vomiting.  Genitourinary: Negative.   Musculoskeletal:  Negative for joint pain and myalgias.  Skin:  Negative for rash.  Neurological:  Negative for weakness.  Endo/Heme/Allergies: Negative.   Psychiatric/Behavioral:  Positive for depression. The patient is nervous/anxious.    Objective:   Vitals:   10/11/21 1500  BP: 128/82  Pulse: 71  SpO2: 98%  Weight: 208 lb (94.3 kg)  Height: 5\' 10"  (1.778 m)   Physical Exam Constitutional:      General: He is not in acute distress. HENT:     Head: Normocephalic and atraumatic.  Eyes:     Extraocular Movements: Extraocular movements intact.     Conjunctiva/sclera: Conjunctivae normal.     Pupils: Pupils are equal, round, and reactive to light.  Cardiovascular:     Rate and Rhythm: Normal rate and regular rhythm.     Pulses: Normal pulses.     Heart sounds: Normal heart sounds. No murmur heard. Abdominal:     General: Bowel sounds are normal.     Palpations: Abdomen is soft.  Musculoskeletal:      Right lower leg: No edema.     Left lower leg: No edema.  Lymphadenopathy:     Cervical: No cervical adenopathy.  Skin:    General: Skin is warm and dry.  Neurological:     General: No focal deficit present.     Mental Status: He is alert.  Psychiatric:        Mood and Affect: Mood normal.        Behavior: Behavior normal.        Thought Content: Thought content normal.        Judgment: Judgment normal.   CBC    Component Value Date/Time   WBC 10.2 09/25/2021 1511   RBC 4.89 09/25/2021 1511   HGB 12.6 (L) 09/25/2021 1511   HCT 39.1 09/25/2021 1511   PLT 258.0 09/25/2021 1511   MCV 80.1 09/25/2021 1511   MCH 28.2 06/01/2020 0834   MCHC 32.1 09/25/2021 1511   RDW 15.3 09/25/2021 1511   LYMPHSABS 2.3 09/25/2021 1511   MONOABS 1.5 (H) 09/25/2021 1511   EOSABS 0.3 09/25/2021 1511   BASOSABS 0.1 09/25/2021 1511   BMP Latest Ref Rng & Units 09/25/2021 06/14/2021 03/22/2021  Glucose 70 - 99 mg/dL 88 92 92  BUN 6 - 23 mg/dL 18 16(X) 09(U)  Creatinine 0.40 - 1.50 mg/dL 0.45 4.09 8.11  BUN/Creat Ratio 6 - 22 (calc) - - -  Sodium 135 - 145 mEq/L 141 140 140  Potassium 3.5 - 5.1 mEq/L 4.1 4.2 4.4  Chloride 96 - 112 mEq/L 104 105 105  CO2 19 - 32 mEq/L 29 28 29   Calcium 8.4 - 10.5 mg/dL 9.1 9.1 9.5   Chest imaging: CXR 09/25/21 Frontal and lateral views of the chest demonstrate an unremarkable cardiac silhouette. Interval elevation of the left hemidiaphragm, with patchy consolidation at the left lung base likely representing atelectasis. No airspace disease,  effusion, or pneumothorax. No acute bony abnormalities.  CXR 06/01/20 Stable cardiomediastinal silhouette with normal heart size. No pneumothorax. No pleural effusion. Lungs appear clear, with no acute consolidative airspace disease and no pulmonary edema.  PFT: No flowsheet data found.  Labs:  Path:  Echo:  Heart Catheterization:  Assessment & Plan:   Subacute cough - Plan: CT Chest Wo Contrast  Elevated  hemidiaphragm - Plan: CT Chest Wo Contrast, Pulmonary Function Test  Mild persistent asthma without complication - Plan: budesonide-formoterol (SYMBICORT) 160-4.5 MCG/ACT inhaler, Pulmonary Function Test  Discussion: George Ward is a 50 year old male, former smoker with history of asthma who is referred to pulmonary clinic for cough.   His cough may be due to post-viral reactive airways disease vs post-nasal drainage vs GERD.   We will increase his symbicort to 160-4.26mcg 2 puffs twice daily and start him on ipratropium nasal spray, 2 sprays per nostril twice daily.   We will check a chest CT scan to evaluate non-traumatic elevation of his left hemidiaphragm.   Follow up in 2 months with pulmonary function tests.  Melody Comas, MD [facility] Office: [phone]    Current Outpatient Medications:    acetaminophen (TYLENOL) 500 MG tablet, Take 1,000 mg by mouth daily as needed for moderate pain or headache., Disp: , Rfl:    albuterol (PROVENTIL HFA;VENTOLIN HFA) 108 (90 Base) MCG/ACT inhaler, Inhale 1-2 puffs into the lungs every 6 (six) hours as needed for wheezing or shortness of breath. Ventolin, Disp: , Rfl:    atorvastatin (LIPITOR) 40 MG tablet, TAKE 1 TABLET BY MOUTH EVERY DAY, Disp: 90 tablet, Rfl: 1   budesonide-formoterol (SYMBICORT) 160-4.5 MCG/ACT inhaler, Inhale 2 puffs into the lungs 2 (two) times daily., Disp: 1 each, Rfl: 6   calcium carbonate (TUMS - DOSED IN MG ELEMENTAL CALCIUM) 500 MG chewable tablet, Chew 2 tablets by mouth daily as needed for indigestion or heartburn., Disp: , Rfl:    COVID-19 mRNA bivalent vaccine, Pfizer, (PFIZER COVID-19 VAC BIVALENT) injection, Inject into the muscle., Disp: 0.3 mL, Rfl: 0   fenofibrate micronized (LOFIBRA) 67 MG capsule, TAKE 1 CAPSULE (67 MG TOTAL) BY MOUTH DAILY BEFORE BREAKFAST., Disp: 90 capsule, Rfl: 3   fluticasone (FLONASE) 50 MCG/ACT nasal spray, SPRAY 2 SPRAYS INTO EACH NOSTRIL EVERY DAY,  Disp: 16 mL, Rfl: 1   HYDROcodone bit-homatropine (HYCODAN) 5-1.5 MG/5ML syrup, Take 5 mLs by mouth every 8 (eight) hours as needed for cough., Disp: 120 mL, Rfl: 0   ibuprofen (ADVIL,MOTRIN) 200 MG tablet, Take 400 mg by mouth daily as needed for headache or moderate pain., Disp: , Rfl:    influenza vac split quadrivalent PF (FLUARIX) 0.5 ML injection, Inject into the muscle., Disp: 0.5 mL, Rfl: 0   loratadine (CLARITIN) 10 MG tablet, Take 10 mg by mouth daily as needed for allergies., Disp: , Rfl:    LORazepam (ATIVAN) 0.5 MG tablet, Take 1 tablet (0.5 mg total) by mouth 2 (two) times daily as needed for anxiety., Disp: 60 tablet, Rfl: 1   Multiple Vitamin (MULTIVITAMIN WITH MINERALS) TABS tablet, Take 1 tablet by mouth daily., Disp: , Rfl:    propranolol (INDERAL) 20 MG tablet, TAKE 1 TABLET BY MOUTH TWICE A DAY, Disp: 180 tablet, Rfl: 1   SUMAtriptan (IMITREX) 50 MG tablet, 1 TABLET DAILY , MAY REPEAT IN 2 HOURS IF HEADACHE PERSISTS OR RECURS., Disp: 9 tablet, Rfl: 2   tobramycin (TOBREX) 0.3 % ophthalmic solution, Place 2 drops into the  right eye every 6 (six) hours., Disp: 5 mL, Rfl: 0   triamterene-hydrochlorothiazide (MAXZIDE-25) 37.5-25 MG tablet, TAKE 1 TABLET BY MOUTH EVERY DAY, Disp: 90 tablet, Rfl: 1   FLUoxetine (PROZAC) 40 MG capsule, Take 2 capsules (80 mg total) by mouth daily., Disp: 180 capsule, Rfl: 1   QUEtiapine (SEROQUEL) 50 MG tablet, Take 2 tablets (100 mg total) by mouth at bedtime., Disp: 180 tablet, Rfl: 1   zolpidem (AMBIEN) 10 MG tablet, Take 1 tablet (10 mg total) by mouth at bedtime as needed for sleep., Disp: 30 tablet, Rfl: 1

## 2021-10-11 NOTE — Patient Instructions (Addendum)
We will check a CT Chest scan to further evaluate the elevated left hemidiaphragm   Use symbicort 160-4.19mcg 2 puffs twice daily - rinse mouth out after each use  Use ipratropium nasal spray, 2 sprays per nostril twice daily - use for 2 weeks then use as needed there after  Follow up in 2 months with PFTs

## 2021-10-12 ENCOUNTER — Encounter: Payer: Self-pay | Admitting: Pulmonary Disease

## 2021-10-12 ENCOUNTER — Telehealth: Payer: Self-pay | Admitting: Pulmonary Disease

## 2021-10-12 MED ORDER — IPRATROPIUM BROMIDE 0.03 % NA SOLN: 2.0000 | Freq: Two times a day (BID) | NASAL | 5 refills | Status: DC

## 2021-10-12 NOTE — Telephone Encounter (Signed)
He is to use the atrovent scheduled 2 sprays per nostril twice daily for the first couple of weeks, then he can use it at night as needed if better.  JD

## 2021-10-12 NOTE — Telephone Encounter (Signed)
Called and spoke with patient's wife Lupita Leash. She verbalized understanding of instructions.   Nothing further needed at time of call.

## 2021-10-12 NOTE — Telephone Encounter (Signed)
Spoke to patient's spouse, Donna(DPR). Butch Penny stated that [facility] did not received RX for Atrovent nasal spray.  She also wants to clarify usage. She was under the impression that patient would use Atrovent only at night. AVS states BID.   Dr. Erin Fulling, please advise on usage and dosage for Atrovent. Thanks

## 2021-10-15 ENCOUNTER — Other Ambulatory Visit: Payer: Self-pay | Admitting: Medical

## 2021-10-24 ENCOUNTER — Other Ambulatory Visit: Payer: Self-pay | Admitting: Medical

## 2021-10-28 ENCOUNTER — Other Ambulatory Visit: Payer: Self-pay | Admitting: Medical

## 2021-11-30 ENCOUNTER — Other Ambulatory Visit: Payer: Self-pay | Admitting: Medical

## 2021-12-05 ENCOUNTER — Other Ambulatory Visit: Payer: Self-pay

## 2021-12-05 ENCOUNTER — Ambulatory Visit
Admission: RE | Admit: 2021-12-05 | Discharge: 2021-12-05 | Disposition: A | Discharge status: Home / Self Care | Payer: BC Managed Care – PPO | Source: Ambulatory Visit | Attending: Pulmonary Disease | Admitting: Pulmonary Disease

## 2021-12-05 DIAGNOSIS — J986 Disorders of diaphragm: Secondary | ICD-10-CM

## 2021-12-05 DIAGNOSIS — R052 Subacute cough: Secondary | ICD-10-CM

## 2021-12-06 ENCOUNTER — Encounter: Payer: Self-pay | Admitting: Medical

## 2021-12-13 ENCOUNTER — Ambulatory Visit: Payer: BC Managed Care – PPO | Admitting: Cardiology

## 2021-12-13 ENCOUNTER — Encounter: Payer: Self-pay | Admitting: Cardiology

## 2021-12-13 DIAGNOSIS — E785 Hyperlipidemia, unspecified: Secondary | ICD-10-CM | POA: Diagnosis not present

## 2021-12-13 DIAGNOSIS — I10 Essential (primary) hypertension: Secondary | ICD-10-CM

## 2021-12-13 DIAGNOSIS — I2510 Atherosclerotic heart disease of native coronary artery without angina pectoris: Secondary | ICD-10-CM

## 2021-12-13 DIAGNOSIS — I2584 Coronary atherosclerosis due to calcified coronary lesion: Secondary | ICD-10-CM

## 2021-12-13 DIAGNOSIS — R0609 Other forms of dyspnea: Secondary | ICD-10-CM | POA: Diagnosis not present

## 2021-12-13 NOTE — Progress Notes (Signed)
? ?Cardiology Consultation:   ? ?Date:  12/13/2021  ? ?ID:  George LeffMark D Heintzelman, DOB 1971-01-06, MRN [id number] ? ?PCP:  Esperanza RichtersSaguier, Edward, PA-C  ?Cardiologist:  Gypsy Balsamobert Makiya Jeune, MD  ? ?Referring MD: Esperanza RichtersSaguier, Edward, PA-C  ? ?Chief Complaint  ?Patient presents with  ? Abnormal CT results  ? ? ?History of Present Illness:   ? ?George Ward is a 51 y.o. male who is being seen today for the evaluation of calcification of the coronary arteries at the request of Saguier, Ramon Dred[place], [place].  Past medical history significant for essential hypertension, dyslipidemia, he was referred to us because he had CT of the chest done because of some shortness of breath he was find out to have calcification of the coronary arteries.  He denies have any chest pain tightness squeezing pressure burning chest.  He is trying to be active he tried to walk 5 miles every single day when he does he will get short of breath but no chest pain.  He is not very sure.  Sign when he was a teenager.  Does not smoke anymore.  He is not on any special diet.  Again he tried to exercise on the regular basis.  He does not have family history of premature coronary artery disease.  There is no palpitations no dizziness no passing out. ? ?Past Medical History:  ?Diagnosis Date  ? Alcohol abuse   ? Remission >20 years ago  ? Allergy   ? Anxiety   ? Asthma   ? Depression   ? MAJOR DEPRESSIVE DISORDER  ? Depression with anxiety   ? Drug abuse (HCC)   ? Remission >20 years ago  ? Encounter for HIV (human immunodeficiency virus) test 2014-2015  ? Environmental allergies   ? History of chicken pox   ? Hyperlipidemia   ? Hypertension   ? Hypoalphalipoproteinemia, familial   ? Lumbar pain   ? Migraines   ? Primary generalized (osteo)arthritis   ? Seasonal allergies   ? ? ?Past Surgical History:  ?Procedure Laterality Date  ? EXCISION MASS HEAD  03/17/2018  ? excision cells on head  ; still in testing for cancer per patient report   ? LUMBAR EPIDURAL INJECTION    ? UMBILICAL  HERNIA REPAIR N/A 04/02/2018  ? Procedure: OPEN UMBILICAL HERNIA REPAIR WITH INSERTION OF MESH;  Surgeon: Andria MeuseWhite, Christopher M, MD;  Location: [facility];  Service: General;  Laterality: N/A;  ? WISDOM TOOTH EXTRACTION  1989-1990  ? ? ?Current Medications: ?Current Meds  ?Medication Sig  ? acetaminophen (TYLENOL) 500 MG tablet Take 1,000 mg by mouth daily as needed for moderate pain or headache.  ? albuterol (PROVENTIL HFA;VENTOLIN HFA) 108 (90 Base) MCG/ACT inhaler Inhale 1-2 puffs into the lungs every 6 (six) hours as needed for wheezing or shortness of breath. Ventolin  ? atorvastatin (LIPITOR) 40 MG tablet TAKE 1 TABLET BY MOUTH EVERY DAY (Patient taking differently: Take 40 mg by mouth daily.)  ? budesonide-formoterol (SYMBICORT) 160-4.5 MCG/ACT inhaler Inhale 2 puffs into the lungs 2 (two) times daily.  ? calcium carbonate (TUMS - DOSED IN MG ELEMENTAL CALCIUM) 500 MG chewable tablet Chew 2 tablets by mouth daily as needed for indigestion or heartburn.  ? fenofibrate micronized (LOFIBRA) 67 MG capsule TAKE 1 CAPSULE (67 MG TOTAL) BY MOUTH DAILY BEFORE BREAKFAST.  ? FLUoxetine (PROZAC) 40 MG capsule Take 2 capsules (80 mg total) by mouth daily.  ? fluticasone (FLONASE) 50 MCG/ACT nasal spray SPRAY 2 SPRAYS INTO EACH NOSTRIL  EVERY DAY (Patient taking differently: Place 2 sprays into both nostrils daily.)  ? ibuprofen (ADVIL,MOTRIN) 200 MG tablet Take 400 mg by mouth daily as needed for headache or moderate pain.  ? ipratropium (ATROVENT) 0.03 % nasal spray Place 2 sprays into both nostrils every 12 (twelve) hours.  ? loratadine (CLARITIN) 10 MG tablet Take 10 mg by mouth daily as needed for allergies.  ? LORazepam (ATIVAN) 0.5 MG tablet Take 1 tablet (0.5 mg total) by mouth 2 (two) times daily as needed for anxiety.  ? Multiple Vitamin (MULTIVITAMIN WITH MINERALS) TABS tablet Take 1 tablet by mouth daily.  ? propranolol (INDERAL) 20 MG tablet TAKE 1 TABLET BY MOUTH TWICE A DAY (Patient taking differently: Take 20 mg  by mouth 2 (two) times daily.)  ? QUEtiapine (SEROQUEL) 50 MG tablet Take 2 tablets (100 mg total) by mouth at bedtime.  ? SUMAtriptan (IMITREX) 50 MG tablet 1 TABLET DAILY , MAY REPEAT IN 2 HOURS IF HEADACHE PERSISTS OR RECURS. (Patient taking differently: Take 50 mg by mouth See admin instructions. 1 tablet daily , May repeat in 2 hours if headache persists or recurs.)  ? tobramycin (TOBREX) 0.3 % ophthalmic solution Place 2 drops into the right eye every 6 (six) hours.  ? triamterene-hydrochlorothiazide (MAXZIDE-25) 37.5-25 MG tablet TAKE 1 TABLET BY MOUTH EVERY DAY (Patient taking differently: Take 1 tablet by mouth daily.)  ? zolpidem (AMBIEN) 10 MG tablet Take 1 tablet (10 mg total) by mouth at bedtime as needed for sleep.  ?  ? ?Allergies:   Co-trimoxazole [sulfamethoxazole-trimethoprim], Lisinopril, and Sulfa antibiotics  ? ?Social History  ? ?Socioeconomic History  ? Marital status: Married  ?  Spouse name: Not on file  ? Number of children: Not on file  ? Years of education: Not on file  ? Highest education level: Not on file  ?Occupational History  ? Not on file  ?Tobacco Use  ? Smoking status: Former  ?  Types: Cigarettes  ?  Quit date: 03/07/1988  ?  Years since quitting: 33.7  ? Smokeless tobacco: Former  ?Vaping Use  ? Vaping Use: Never used  ?Substance and Sexual Activity  ? Alcohol use: Not Currently  ? Drug use: Not Currently  ? Sexual activity: Yes  ?Other Topics Concern  ? Not on file  ?Social History Narrative  ? Not on file  ? ?Social Determinants of Health  ? ?Financial Resource Strain: Not on file  ?Food Insecurity: Not on file  ?Transportation Needs: Not on file  ?Physical Activity: Not on file  ?Stress: Not on file  ?Social Connections: Not on file  ?  ? ?Family History: ?The patient's family history includes Colon cancer in his maternal grandfather; Congestive Heart Failure in his maternal grandfather; Dementia in his paternal grandmother; Healthy in his daughter, sister, and son; Heart  attack in his paternal grandfather; Heart disease in his paternal grandfather; Hepatitis in his paternal aunt; Hepatitis C in his paternal aunt; Hyperlipidemia in his father, maternal uncle, and paternal uncle; Hypertension in his maternal uncle, mother, and paternal uncle; Prostate cancer in his maternal grandfather. ?ROS:   ?Please see the history of present illness.    ?All 14 point review of systems negative except as described per history of present illness. ? ?EKGs/Labs/Other Studies Reviewed:   ? ?The following studies were reviewed today: ?Calcification of the coronary artery noted also elevation of the left fraction. ? ?EKG:  EKG is  ordered today.  The ekg ordered today demonstrates normal  sinus rhythm, normal P interval, normal QS complex duration morphology no ST segment changes ? ?Recent Labs: ?09/25/2021: ALT 20; BUN 18; Creatinine, Ser 1.15; Hemoglobin 12.6; Platelets 258.0; Potassium 4.1; Sodium 141; TSH 0.50  ?Recent Lipid Panel ?   ?Component Value Date/Time  ? CHOL 148 06/14/2021 0826  ? TRIG 153.0 (H) 06/14/2021 0826  ? HDL 36.20 (L) 06/14/2021 0826  ? CHOLHDL 4 06/14/2021 0826  ? VLDL 30.6 06/14/2021 0826  ? LDLCALC 81 06/14/2021 0826  ? LDLDIRECT 81.0 10/19/2019 0740  ? ? ?Physical Exam:   ? ?VS:  BP 136/90 (BP Location: Left Arm, Patient Position: Sitting)   Pulse 65   Ht 5\' 10"  (1.778 m)   Wt 194 lb 9.6 oz (88.3 kg)   SpO2 95%   BMI 27.92 kg/m?    ? ?Wt Readings from Last 3 Encounters:  ?12/13/21 194 lb 9.6 oz (88.3 kg)  ?10/11/21 208 lb (94.3 kg)  ?09/25/21 208 lb (94.3 kg)  ?  ? ?GEN:  Well nourished, well developed in no acute distress ?HEENT: Normal ?NECK: No JVD; No carotid bruits ?LYMPHATICS: No lymphadenopathy ?CARDIAC: RRR, no murmurs, no rubs, no gallops ?RESPIRATORY:  Clear to auscultation without rales, wheezing or rhonchi  ?ABDOMEN: Soft, non-tender, non-distended ?MUSCULOSKELETAL:  No edema; No deformity  ?SKIN: Warm and dry ?NEUROLOGIC:  Alert and oriented x 3 ?PSYCHIATRIC:   Normal affect  ? ?ASSESSMENT:   ? ?1. Coronary artery calcification   ?2. Dyspnea on exertion   ?3. Dyslipidemia   ?4. Essential hypertension   ? ?PLAN:   ? ?In order of problems listed above: ? ?Calcificati

## 2021-12-13 NOTE — Patient Instructions (Addendum)
Medication Instructions:  ?Your physician recommends that you continue on your current medications as directed. Please refer to the Current Medication list given to you today. ? ?*If you need a refill on your cardiac medications before your next appointment, please call your pharmacy* ? ? ?Lab Work: ?Your physician recommends that you return for lab work in: Today for Lipid profile and LPa ? ?If you have labs (blood work) drawn today and your tests are completely normal, you will receive your results only by: ?MyChart Message (if you have MyChart) OR ?A paper copy in the mail ?If you have any lab test that is abnormal or we need to change your treatment, we will call you to review the results. ? ? ?Testing/Procedures:Your physician has requested that you have an exercise tolerance test. For further information please visit [email or web address] Please also follow instruction sheet, as given. ? ? ? ?Follow-Up: ?At [facility], you and your health needs are our priority.  As part of our continuing mission to provide you with exceptional heart care, we have created designated Provider Care Teams.  These Care Teams include your primary Cardiologist (physician) and Advanced Practice Providers (APPs -  Physician Assistants and Nurse Practitioners) who all work together to provide you with the care you need, when you need it. ? ?We recommend signing up for the patient portal called "MyChart".  Sign up information is provided on this After Visit Summary.  MyChart is used to connect with patients for Virtual Visits (Telemedicine).  Patients are able to view lab/test results, encounter notes, upcoming appointments, etc.  Non-urgent messages can be sent to your provider as well.   ?To learn more about what you can do with MyChart, go to [email or web address].   ? ?Your next appointment:   ?3 month(s) ? ?The format for your next appointment:   ?In Person ? ?Provider:   ?Gypsy Balsam, MD  ? ? ?Other Instructions ?  ?

## 2021-12-14 LAB — LIPID PANEL
Chol/HDL Ratio: 3.7 ratio (ref 0.0–5.0)
Cholesterol, Total: 153 mg/dL (ref 100–199)
HDL: 41 mg/dL (ref >39)
LDL Chol Calc (NIH): 94 mg/dL (ref 0–99)
Triglycerides: 95 mg/dL (ref 0–149)
VLDL Cholesterol Cal: 18 mg/dL (ref 5–40)

## 2021-12-14 LAB — LIPOPROTEIN A (LPA): Lipoprotein (a): 61.1 nmol/L (ref <75.0)

## 2021-12-15 ENCOUNTER — Ambulatory Visit: Payer: BC Managed Care – PPO | Admitting: Pulmonary Disease

## 2021-12-15 ENCOUNTER — Ambulatory Visit (INDEPENDENT_AMBULATORY_CARE_PROVIDER_SITE_OTHER): Payer: BC Managed Care – PPO | Admitting: Pulmonary Disease

## 2021-12-15 ENCOUNTER — Encounter: Payer: Self-pay | Admitting: Pulmonary Disease

## 2021-12-15 VITALS — BP 120/72 | HR 63 | Ht 70.0 in | Wt 201.8 lb

## 2021-12-15 DIAGNOSIS — J4530 Mild persistent asthma, uncomplicated: Secondary | ICD-10-CM

## 2021-12-15 DIAGNOSIS — J986 Disorders of diaphragm: Secondary | ICD-10-CM

## 2021-12-15 DIAGNOSIS — R052 Subacute cough: Secondary | ICD-10-CM

## 2021-12-15 LAB — PULMONARY FUNCTION TEST
DL/VA % pred: 87 %
DL/VA: 3.86 ml/min/mmHg/L
DLCO cor % pred: 78 %
DLCO cor: 22.94 ml/min/mmHg
DLCO unc % pred: 78 %
DLCO unc: 22.94 ml/min/mmHg
FEF 25-75 Post: 3.27 L/sec
FEF 25-75 Pre: 2.53 L/sec
FEF2575-%Change-Post: 29 %
FEF2575-%Pred-Post: 95 %
FEF2575-%Pred-Pre: 73 %
FEV1-%Change-Post: 5 %
FEV1-%Pred-Post: 83 %
FEV1-%Pred-Pre: 78 %
FEV1-Post: 3.25 L
FEV1-Pre: 3.06 L
FEV1FVC-%Change-Post: 4 %
FEV1FVC-%Pred-Pre: 100 %
FEV6-%Change-Post: 1 %
FEV6-%Pred-Post: 82 %
FEV6-%Pred-Pre: 81 %
FEV6-Post: 3.99 L
FEV6-Pre: 3.93 L
FEV6FVC-%Change-Post: 0 %
FEV6FVC-%Pred-Post: 103 %
FEV6FVC-%Pred-Pre: 103 %
FVC-%Change-Post: 1 %
FVC-%Pred-Post: 79 %
FVC-%Pred-Pre: 78 %
FVC-Post: 3.99 L
FVC-Pre: 3.94 L
Post FEV1/FVC ratio: 81 %
Post FEV6/FVC ratio: 100 %
Pre FEV1/FVC ratio: 78 %
Pre FEV6/FVC Ratio: 100 %
RV % pred: 95 %
RV: 1.96 L
TLC % pred: 87 %
TLC: 6.11 L

## 2021-12-15 MED ORDER — ATORVASTATIN CALCIUM 80 MG PO TABS: 40.0000 mg | ORAL_TABLET | Freq: Every day | ORAL | 3 refills | Status: DC

## 2021-12-15 NOTE — Progress Notes (Signed)
Full PFT completed today ? ?

## 2021-12-15 NOTE — Progress Notes (Signed)
? ?Synopsis: Referred in January 2023 for cough by Esperanza Richters, PA ? ?Subjective:  ? ?PATIENT ID: George Ward GENDER: male DOB: 01/25/1971, MRN: [id number] ? ?HPI ? ?Chief Complaint  ?Patient presents with  ? Follow-up  ?  2 mo f/u after PFT  ? ?George Ward is a 51 year old male, former smoker with history of asthma who returns to pulmonary clinic for cough.  ? ?The cough was thought to be due to post-viral reactive airways disease vs post-nasal drainage vs GERD.  ? ?His ICS inhaler was increased to symbicort 160-4.91mcg and started on ipratropium nasal spray.  ? ?CT Chest 12/05/21 shows elevation of left hemidiaphragm with partial compressive atelectasis of left lower lobe. No other infiltrates or opacities. ? ?He reports the cough is overall improved since starting ipratropium nasal spray and using high-dose Symbicort inhaler.  He remains active and is walking about 5 miles per day. ? ?OV 10/11/21 ?He reports having cough since December. He went to an urgent care while traveling to [place] in early January where he was treated with doxycyline, prednisone and PRN albuterol with some relief. He was then evaluated by his PCP on 09/25/20 where he was treated with azithromycin and prednisone taper. He had laboratory workup for fatigue at that time as well. Chest x-ray from 1/16 shows elevated left hemidiaphragm that is new compared to 05/2020. He complains of his heart racing at times and has intermittent chills. ? ?He continues to have cough that is occasionally productive. He has sinus congestion with post-nasal drainage. He has intermittent reflux, but feels this is not a major issue at this time. He has seasonal allergies and takes claritin daily.  ? ?He is a former smoker, smoked in high school and had second hand smoke from his father. 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 ? ?He had left shoulder surgery on 10/22/19 without issues. He has chronic lumbar back issues. No neck pain.   ? ?Past Medical History:  ?Diagnosis Date  ? Alcohol abuse   ? Remission >20 years ago  ? Allergy   ? Anxiety   ? Asthma   ? Depression   ? MAJOR DEPRESSIVE DISORDER  ? Depression with anxiety   ? Drug abuse (HCC)   ? Remission >20 years ago  ? Encounter for HIV (human immunodeficiency virus) test 2014-2015  ? Environmental allergies   ? History of chicken pox   ? Hyperlipidemia   ? Hypertension   ? Hypoalphalipoproteinemia, familial   ? Lumbar pain   ? Migraines   ? Primary generalized (osteo)arthritis   ? Seasonal allergies   ?  ? ?Family History  ?Problem Relation Age of Onset  ? Hypertension Mother   ? Hyperlipidemia Father   ? Healthy Sister   ? Colon cancer Maternal Grandfather   ? Prostate cancer Maternal Grandfather   ? Congestive Heart Failure Maternal Grandfather   ? Dementia Paternal Grandmother   ? Heart disease Paternal Grandfather   ? Heart attack Paternal Grandfather   ? Hypertension Maternal Uncle   ? Hyperlipidemia Maternal Uncle   ? Hepatitis Paternal Aunt   ? Hepatitis C Paternal Aunt   ? Hypertension Paternal Uncle   ? Hyperlipidemia Paternal Uncle   ? Healthy Son   ?     x1  ? Healthy Daughter   ?     x4  ?  ? ?Social History  ? ?Socioeconomic History  ? Marital status: Married  ?  Spouse name: Not on file  ? Number of children: Not on file  ? Years of education: Not on file  ? Highest education level: Not on file  ?Occupational History  ? Not on file  ?Tobacco Use  ? Smoking status: Former  ?  Types: Cigarettes  ?  Quit date: 03/07/1988  ?  Years since quitting: 33.8  ? Smokeless tobacco: Former  ?Vaping Use  ? Vaping Use: Never used  ?Substance and Sexual Activity  ? Alcohol use: Not Currently  ? Drug use: Not Currently  ? Sexual activity: Yes  ?Other Topics Concern  ? Not on file  ?Social History Narrative  ? Not on file  ? ?Social Determinants of Health  ? ?Financial Resource Strain: Not on file  ?Food Insecurity: Not on file  ?Transportation Needs: Not on file  ?Physical Activity: Not on  file  ?Stress: Not on file  ?Social Connections: Not on file  ?Intimate Partner Violence: Not on file  ?  ? ?Allergies  ?Allergen Reactions  ? Co-Trimoxazole [Sulfamethoxazole-Trimethoprim] Nausea And Vomiting  ? Lisinopril Cough  ?  With Wheezing ?  ? Sulfa Antibiotics Nausea And Vomiting  ?  ? ?Outpatient Medications Prior to Visit  ?Medication Sig Dispense Refill  ? acetaminophen (TYLENOL) 500 MG tablet Take 1,000 mg by mouth daily as needed for moderate pain or headache.    ? albuterol (PROVENTIL HFA;VENTOLIN HFA) 108 (90 Base) MCG/ACT inhaler Inhale 1-2 puffs into the lungs every 6 (six) hours as needed for wheezing or shortness of breath. Ventolin    ? budesonide-formoterol (SYMBICORT) 160-4.5 MCG/ACT inhaler Inhale 2 puffs into the lungs 2 (two) times daily. 1 each 6  ? calcium carbonate (TUMS - DOSED IN MG ELEMENTAL CALCIUM) 500 MG chewable tablet Chew 2 tablets by mouth daily as needed for indigestion or heartburn.    ? fenofibrate micronized (LOFIBRA) 67 MG capsule TAKE 1 CAPSULE (67 MG TOTAL) BY MOUTH DAILY BEFORE BREAKFAST. 90 capsule 3  ? fluticasone (FLONASE) 50 MCG/ACT nasal spray SPRAY 2 SPRAYS INTO EACH NOSTRIL EVERY DAY (Patient taking differently: Place 2 sprays into both nostrils daily.) 16 mL 5  ? ibuprofen (ADVIL,MOTRIN) 200 MG tablet Take 400 mg by mouth daily as needed for headache or moderate pain.    ? ipratropium (ATROVENT) 0.03 % nasal spray Place 2 sprays into both nostrils every 12 (twelve) hours. 30 mL 5  ? loratadine (CLARITIN) 10 MG tablet Take 10 mg by mouth daily as needed for allergies.    ? LORazepam (ATIVAN) 0.5 MG tablet Take 1 tablet (0.5 mg total) by mouth 2 (two) times daily as needed for anxiety. 60 tablet 1  ? Multiple Vitamin (MULTIVITAMIN WITH MINERALS) TABS tablet Take 1 tablet by mouth daily.    ? propranolol (INDERAL) 20 MG tablet TAKE 1 TABLET BY MOUTH TWICE A DAY (Patient taking differently: Take 20 mg by mouth 2 (two) times daily.) 180 tablet 1  ? SUMAtriptan  (IMITREX) 50 MG tablet 1 TABLET DAILY , MAY REPEAT IN 2 HOURS IF HEADACHE PERSISTS OR RECURS. (Patient taking differently: Take 50 mg by mouth See admin instructions. 1 tablet daily , May repeat in 2 hours if headache persists or recurs.) 9 tablet 2  ? tobramycin (TOBREX) 0.3 % ophthalmic solution Place 2 drops into the right eye every 6 (six) hours. 5 mL 0  ? triamterene-hydrochlorothiazide (MAXZIDE-25) 37.5-25 MG tablet TAKE 1 TABLET BY MOUTH EVERY DAY (Patient taking differently: Take 1 tablet by mouth daily.) 90 tablet  1  ? atorvastatin (LIPITOR) 40 MG tablet TAKE 1 TABLET BY MOUTH EVERY DAY (Patient taking differently: Take 40 mg by mouth daily.) 90 tablet 1  ? FLUoxetine (PROZAC) 40 MG capsule Take 2 capsules (80 mg total) by mouth daily. 180 capsule 1  ? QUEtiapine (SEROQUEL) 50 MG tablet Take 2 tablets (100 mg total) by mouth at bedtime. 180 tablet 1  ? zolpidem (AMBIEN) 10 MG tablet Take 1 tablet (10 mg total) by mouth at bedtime as needed for sleep. 30 tablet 1  ? ?No facility-administered medications prior to visit.  ? ?Review of Systems  ?Constitutional:  Negative for chills, fever, malaise/fatigue and weight loss.  ?HENT:  Positive for congestion. Negative for sore throat.   ?Eyes: Negative.   ?Respiratory:  Positive for cough. Negative for hemoptysis, sputum production, shortness of breath and wheezing.   ?Cardiovascular:  Negative for chest pain, palpitations, orthopnea, claudication and leg swelling.  ?Gastrointestinal:  Negative for abdominal pain, heartburn, nausea and vomiting.  ?Genitourinary: Negative.   ?Musculoskeletal:  Negative for joint pain and myalgias.  ?Skin:  Negative for rash.  ?Neurological:  Negative for weakness.  ?Endo/Heme/Allergies: Negative.   ?Psychiatric/Behavioral:  Positive for depression. The patient is nervous/anxious.   ? ?Objective:  ? ?Vitals:  ? 12/15/21 0957  ?BP: 120/72  ?Pulse: 63  ?SpO2: 97%  ?Weight: 201 lb 12.8 oz (91.5 kg)  ?Height: 5\' 10"  (1.778 m)   ? ?Physical Exam ?Constitutional:   ?   General: He is not in acute distress. ?HENT:  ?   Head: Normocephalic and atraumatic.  ?Eyes:  ?   Conjunctiva/sclera: Conjunctivae normal.  ?Cardiovascular:  ?   Rate and Rhyt

## 2021-12-15 NOTE — Addendum Note (Signed)
Addended by: Truddie Hidden on: 12/15/2021 02:12 PM ? ? Modules accepted: Orders ? ?

## 2021-12-15 NOTE — Patient Instructions (Signed)
Your CT scan looks good, you have elevated left hemidiaphragm with atelectasis (reversible collapse of lung) of the left lower lung. ? ?Your breathing tests are normal. ? ?Reduce dose of symbicort to 80-4.19mcg 2 puffs twice daily from the 160-4.53mcg dose. ? ?Monitor for any return of cough symptoms, if they return the move back to higher dose symbicort.  ? ?Continue ipratropium nasal spray.  ? ?Follow up in 6 months.  ?

## 2021-12-16 ENCOUNTER — Encounter: Payer: Self-pay | Admitting: Pulmonary Disease

## 2021-12-19 ENCOUNTER — Ambulatory Visit (INDEPENDENT_AMBULATORY_CARE_PROVIDER_SITE_OTHER): Payer: BC Managed Care – PPO

## 2021-12-19 DIAGNOSIS — I2584 Coronary atherosclerosis due to calcified coronary lesion: Secondary | ICD-10-CM

## 2021-12-19 DIAGNOSIS — I10 Essential (primary) hypertension: Secondary | ICD-10-CM | POA: Diagnosis not present

## 2021-12-19 DIAGNOSIS — R0609 Other forms of dyspnea: Secondary | ICD-10-CM | POA: Diagnosis not present

## 2021-12-19 DIAGNOSIS — I2510 Atherosclerotic heart disease of native coronary artery without angina pectoris: Secondary | ICD-10-CM

## 2021-12-19 DIAGNOSIS — E785 Hyperlipidemia, unspecified: Secondary | ICD-10-CM

## 2021-12-19 LAB — EXERCISE TOLERANCE TEST
Angina Index: 0
Duke Treadmill Score: 12
Estimated workload: 13.4
Exercise duration (min): 11 min
Exercise duration (sec): 55 s
MPHR: 169 {beats}/min
Peak HR: 129 {beats}/min
Percent HR: 76 %
RPE: 16
Rest HR: 71 {beats}/min
ST Depression (mm): 0 mm

## 2022-01-13 ENCOUNTER — Other Ambulatory Visit: Payer: Self-pay | Admitting: Medical

## 2022-01-18 ENCOUNTER — Other Ambulatory Visit: Payer: Self-pay | Admitting: Medical

## 2022-01-31 ENCOUNTER — Ambulatory Visit (INDEPENDENT_AMBULATORY_CARE_PROVIDER_SITE_OTHER): Payer: BC Managed Care – PPO | Admitting: Medical

## 2022-01-31 VITALS — BP 120/80 | HR 63 | Temp 98.5°F | Resp 18 | Ht 70.0 in | Wt 197.0 lb

## 2022-01-31 DIAGNOSIS — J0100 Acute maxillary sinusitis, unspecified: Secondary | ICD-10-CM | POA: Diagnosis not present

## 2022-01-31 DIAGNOSIS — R059 Cough, unspecified: Secondary | ICD-10-CM | POA: Diagnosis not present

## 2022-01-31 DIAGNOSIS — J309 Allergic rhinitis, unspecified: Secondary | ICD-10-CM

## 2022-01-31 DIAGNOSIS — J40 Bronchitis, not specified as acute or chronic: Secondary | ICD-10-CM | POA: Diagnosis not present

## 2022-01-31 MED ORDER — AZITHROMYCIN 250 MG PO TABS: ORAL_TABLET | ORAL | 0 refills | Status: AC

## 2022-01-31 MED ORDER — BENZONATATE 100 MG PO CAPS: 100.0000 mg | ORAL_CAPSULE | Freq: Three times a day (TID) | ORAL | 0 refills | Status: DC | PRN

## 2022-01-31 MED ORDER — METHYLPREDNISOLONE 4 MG PO TABS: ORAL_TABLET | ORAL | 0 refills | Status: DC

## 2022-01-31 NOTE — Progress Notes (Signed)
Subjective:    Patient ID: George Ward, male    DOB: 1971-09-02, 51 y.o.   MRN: [id number]  HPI  Pt in with some recent cough, nasal congestion, chest congestion, runny nose, mild st and sinus pressure.   Symptoms for about 5 days.   Some subjective fever and at times chills.   Has not tested for covid.  Pt has been taking otc claritin, flonase and symbicort.  No wheezing.  When coughs is bringing up mucus.    Review of Systems  Constitutional:  Negative for chills, fatigue and fever.  HENT:  Positive for congestion, sinus pressure, sinus pain and sore throat. Negative for ear pain, nosebleeds and postnasal drip.   Respiratory:  Negative for cough, chest tightness, shortness of breath and wheezing.   Cardiovascular:  Negative for chest pain and palpitations.  Gastrointestinal:  Negative for abdominal pain and blood in stool.  Genitourinary:  Negative for dysuria.  Musculoskeletal:  Negative for back pain, joint swelling and neck stiffness.  Skin:  Negative for rash.  Neurological:  Negative for dizziness, light-headedness and headaches.  Hematological:  Negative for adenopathy. Does not bruise/bleed easily.  Psychiatric/Behavioral:  Negative for behavioral problems and decreased concentration.     Past Medical History:  Diagnosis Date   Alcohol abuse    Remission >20 years ago   Allergy    Anxiety    Asthma    Depression    MAJOR DEPRESSIVE DISORDER   Depression with anxiety    Drug abuse (HCC)    Remission >20 years ago   Encounter for HIV (human immunodeficiency virus) test 2014-2015   Environmental allergies    History of chicken pox    Hyperlipidemia    Hypertension    Hypoalphalipoproteinemia, familial    Lumbar pain    Migraines    Primary generalized (osteo)arthritis    Seasonal allergies      Social History   Socioeconomic History   Marital status: Married    Spouse name: Not on file   Number of children: Not on file   Years of  education: Not on file   Highest education level: Not on file  Occupational History   Not on file  Tobacco Use   Smoking status: Former    Types: Cigarettes    Quit date: 03/07/1988    Years since quitting: 33.9   Smokeless tobacco: Former  Building services engineerVaping Use   Vaping Use: Never used  Substance and Sexual Activity   Alcohol use: Not Currently   Drug use: Not Currently   Sexual activity: Yes  Other Topics Concern   Not on file  Social History Narrative   Not on file   Social Determinants of Health   Financial Resource Strain: Not on file  Food Insecurity: Not on file  Transportation Needs: Not on file  Physical Activity: Not on file  Stress: Not on file  Social Connections: Not on file  Intimate Partner Violence: Not on file    Past Surgical History:  Procedure Laterality Date   EXCISION MASS HEAD  03/17/2018   excision cells on head  ; still in testing for cancer per patient report    LUMBAR EPIDURAL INJECTION     UMBILICAL HERNIA REPAIR N/A 04/02/2018   Procedure: OPEN UMBILICAL HERNIA REPAIR WITH INSERTION OF MESH;  Surgeon: Andria MeuseWhite, Christopher M, MD;  Location: [facility];  Service: General;  Laterality: N/A;   WISDOM TOOTH EXTRACTION  1989-1990    Family History  Problem Relation  Age of Onset   Hypertension Mother    Hyperlipidemia Father    Healthy Sister    Colon cancer Maternal Grandfather    Prostate cancer Maternal Grandfather    Congestive Heart Failure Maternal Grandfather    Dementia Paternal Grandmother    Heart disease Paternal Grandfather    Heart attack Paternal Grandfather    Hypertension Maternal Uncle    Hyperlipidemia Maternal Uncle    Hepatitis Paternal Aunt    Hepatitis C Paternal Aunt    Hypertension Paternal Uncle    Hyperlipidemia Paternal Uncle    Healthy Son        x1   Healthy Daughter        x4    Allergies  Allergen Reactions   Co-Trimoxazole [Sulfamethoxazole-Trimethoprim] Nausea And Vomiting   Lisinopril Cough    With Wheezing     Sulfa Antibiotics Nausea And Vomiting    Current Outpatient Medications on File Prior to Visit  Medication Sig Dispense Refill   acetaminophen (TYLENOL) 500 MG tablet Take 1,000 mg by mouth daily as needed for moderate pain or headache.     albuterol (PROVENTIL HFA;VENTOLIN HFA) 108 (90 Base) MCG/ACT inhaler Inhale 1-2 puffs into the lungs every 6 (six) hours as needed for wheezing or shortness of breath. Ventolin     atorvastatin (LIPITOR) 80 MG tablet Take 0.5 tablets (40 mg total) by mouth daily. 90 tablet 3   budesonide-formoterol (SYMBICORT) 160-4.5 MCG/ACT inhaler Inhale 2 puffs into the lungs 2 (two) times daily. 1 each 6   calcium carbonate (TUMS - DOSED IN MG ELEMENTAL CALCIUM) 500 MG chewable tablet Chew 2 tablets by mouth daily as needed for indigestion or heartburn.     fenofibrate micronized (LOFIBRA) 67 MG capsule TAKE 1 CAPSULE (67 MG TOTAL) BY MOUTH DAILY BEFORE BREAKFAST. 90 capsule 3   fluticasone (FLONASE) 50 MCG/ACT nasal spray SPRAY 2 SPRAYS INTO EACH NOSTRIL EVERY DAY (Patient taking differently: Place 2 sprays into both nostrils daily.) 16 mL 5   ibuprofen (ADVIL,MOTRIN) 200 MG tablet Take 400 mg by mouth daily as needed for headache or moderate pain.     ipratropium (ATROVENT) 0.03 % nasal spray Place 2 sprays into both nostrils every 12 (twelve) hours. 30 mL 5   loratadine (CLARITIN) 10 MG tablet Take 10 mg by mouth daily as needed for allergies.     LORazepam (ATIVAN) 0.5 MG tablet Take 1 tablet (0.5 mg total) by mouth 2 (two) times daily as needed for anxiety. 60 tablet 1   Multiple Vitamin (MULTIVITAMIN WITH MINERALS) TABS tablet Take 1 tablet by mouth daily.     propranolol (INDERAL) 20 MG tablet TAKE 1 TABLET BY MOUTH TWICE A DAY (Patient taking differently: Take 20 mg by mouth 2 (two) times daily.) 180 tablet 1   SUMAtriptan (IMITREX) 50 MG tablet 1 TABLET DAILY , MAY REPEAT IN 2 HOURS IF HEADACHE PERSISTS OR RECURS. 9 tablet 2   tobramycin (TOBREX) 0.3 % ophthalmic  solution Place 2 drops into the right eye every 6 (six) hours. 5 mL 0   triamterene-hydrochlorothiazide (MAXZIDE-25) 37.5-25 MG tablet TAKE 1 TABLET BY MOUTH EVERY DAY 90 tablet 1   FLUoxetine (PROZAC) 40 MG capsule Take 2 capsules (80 mg total) by mouth daily. 180 capsule 1   QUEtiapine (SEROQUEL) 50 MG tablet Take 2 tablets (100 mg total) by mouth at bedtime. 180 tablet 1   zolpidem (AMBIEN) 10 MG tablet Take 1 tablet (10 mg total) by mouth at bedtime as needed for  sleep. 30 tablet 1   No current facility-administered medications on file prior to visit.    BP 120/80   Pulse 63   Temp 98.5 F (36.9 C)   Resp 18   Ht 5\' 10"  (1.778 m)   Wt 197 lb (89.4 kg)   SpO2 99%   BMI 28.27 kg/m       Objective:   Physical Exam  General Mental Status- Alert. General Appearance- Not in acute distress.   Skin General: Color- Normal Color. Moisture- Normal Moisture.  Neck Carotid Arteries- Normal color. Moisture- Normal Moisture. No carotid bruits. No JVD.  Chest and Lung Exam Auscultation: Breath Sounds:-Normal.  Cardiovascular Auscultation:Rythm- Regular. Murmurs & Other Heart Sounds:Auscultation of the heart reveals- No Murmurs.  Abdomen Inspection:-Inspeection Normal. Palpation/Percussion:Note:No mass. Palpation and Percussion of the abdomen reveal- Non Tender, Non Distended + BS, no rebound or guarding.  Heent- maxillary sinus pressure. Boggy turbinates. +pnd.  Neurologic Cranial Nerve exam:- CN III-XII intact(No nystagmus), symmetric smile. Strength:- 5/5 equal and symmetric strength both upper and lower extremities.       Assessment & Plan:   Patient Instructions  Allergic rhinitis- continue claritin and flonase.   Sinus infection and bronchitis following allergy symptoms. Benzonatate for cough and azithromycin antibiotic.  Continue symbicort.  Will make taper dose medrol available to use if allergy type symptoms worsen or if flare of wheezing.  Follow up late  summer wellness exam or sooner if needed.   , PA-C

## 2022-01-31 NOTE — Patient Instructions (Addendum)
Allergic rhinitis- continue claritin and flonase.   Sinus infection and bronchitis following allergy symptoms. Benzonatate for cough and azithromycin antibiotic.  Continue symbicort.  Will make taper dose medrol available to use if allergy type symptoms worsen or if flare of wheezing.  Follow up late summer wellness exam or sooner if needed.

## 2022-02-07 ENCOUNTER — Encounter: Payer: Self-pay | Admitting: Medical

## 2022-02-21 ENCOUNTER — Other Ambulatory Visit: Payer: Self-pay | Admitting: Medical

## 2022-02-21 ENCOUNTER — Other Ambulatory Visit: Payer: Self-pay | Admitting: Pulmonary Disease

## 2022-04-04 ENCOUNTER — Ambulatory Visit: Payer: BC Managed Care – PPO | Admitting: Cardiology

## 2022-04-10 ENCOUNTER — Ambulatory Visit: Payer: BC Managed Care – PPO | Admitting: Cardiology

## 2022-04-10 ENCOUNTER — Encounter: Payer: Self-pay | Admitting: Cardiology

## 2022-04-10 VITALS — BP 118/82 | HR 66 | Ht 70.0 in | Wt 195.0 lb

## 2022-04-10 DIAGNOSIS — I10 Essential (primary) hypertension: Secondary | ICD-10-CM

## 2022-04-10 DIAGNOSIS — R0609 Other forms of dyspnea: Secondary | ICD-10-CM | POA: Diagnosis not present

## 2022-04-10 DIAGNOSIS — I2510 Atherosclerotic heart disease of native coronary artery without angina pectoris: Secondary | ICD-10-CM | POA: Diagnosis not present

## 2022-04-10 DIAGNOSIS — I2584 Coronary atherosclerosis due to calcified coronary lesion: Secondary | ICD-10-CM

## 2022-04-10 DIAGNOSIS — E785 Hyperlipidemia, unspecified: Secondary | ICD-10-CM | POA: Diagnosis not present

## 2022-04-10 NOTE — Progress Notes (Unsigned)
Cardiology Office Note:    Date:  04/10/2022   ID:  George Ward, DOB [birth year], MRN [id number]  PCP:  Esperanza Richters, PA-C  Cardiologist:  Gypsy Balsam, MD    Referring MD: Marisue Brooklyn   Chief Complaint  Patient presents with   Follow-up    History of Present Illness:    George Ward is a 51 y.o. male with past medical history significant for coronary artery calcifications, essential hypertension, dyspnea on exertion, dyslipidemia.  He did have a stress test done recently, he walked 12 minutes on the treadmill with no EKG changes, overall stress test being negative.  He is coming today to office discuss his issue.  Overall he is doing well.  Denies have any chest pain tightness squeezing pressure burning chest.  Past Medical History:  Diagnosis Date   Alcohol abuse    Remission >20 years ago   Allergy    Anxiety    Asthma    Depression    MAJOR DEPRESSIVE DISORDER   Depression with anxiety    Drug abuse (HCC)    Remission >20 years ago   Encounter for HIV (human immunodeficiency virus) test 2014-2015   Environmental allergies    History of chicken pox    Hyperlipidemia    Hypertension    Hypoalphalipoproteinemia, familial    Lumbar pain    Migraines    Primary generalized (osteo)arthritis    Seasonal allergies     Past Surgical History:  Procedure Laterality Date   EXCISION MASS HEAD  03/17/2018   excision cells on head  ; still in testing for cancer per patient report    LUMBAR EPIDURAL INJECTION     UMBILICAL HERNIA REPAIR N/A 04/02/2018   Procedure: OPEN UMBILICAL HERNIA REPAIR WITH INSERTION OF MESH;  Surgeon: Andria Meuse, MD;  Location: [facility];  Service: General;  Laterality: N/A;   WISDOM TOOTH EXTRACTION  1989-1990    Current Medications: Current Meds  Medication Sig   acetaminophen (TYLENOL) 500 MG tablet Take 1,000 mg by mouth daily as needed for moderate pain or headache.   albuterol (PROVENTIL HFA;VENTOLIN HFA) 108 (90  Base) MCG/ACT inhaler Inhale 1-2 puffs into the lungs every 6 (six) hours as needed for wheezing or shortness of breath. Ventolin   atorvastatin (LIPITOR) 80 MG tablet Take 0.5 tablets (40 mg total) by mouth daily.   budesonide-formoterol (SYMBICORT) 160-4.5 MCG/ACT inhaler Inhale 2 puffs into the lungs 2 (two) times daily.   calcium carbonate (TUMS - DOSED IN MG ELEMENTAL CALCIUM) 500 MG chewable tablet Chew 2 tablets by mouth daily as needed for indigestion or heartburn.   fenofibrate micronized (LOFIBRA) 67 MG capsule TAKE 1 CAPSULE (67 MG TOTAL) BY MOUTH DAILY BEFORE BREAKFAST. (Patient taking differently: Take 67 mg by mouth daily before breakfast.)   FLUoxetine (PROZAC) 40 MG capsule Take 2 capsules (80 mg total) by mouth daily.   fluticasone (FLONASE) 50 MCG/ACT nasal spray SPRAY 2 SPRAYS INTO EACH NOSTRIL EVERY DAY (Patient taking differently: Place 2 sprays into both nostrils daily.)   ibuprofen (ADVIL,MOTRIN) 200 MG tablet Take 400 mg by mouth daily as needed for headache or moderate pain.   ipratropium (ATROVENT) 0.03 % nasal spray PLACE 2 SPRAYS INTO BOTH NOSTRILS EVERY 12 (TWELVE) HOURS.   loratadine (CLARITIN) 10 MG tablet Take 10 mg by mouth daily as needed for allergies.   LORazepam (ATIVAN) 0.5 MG tablet Take 1 tablet (0.5 mg total) by mouth 2 (two) times daily as needed for  anxiety.   Multiple Vitamin (MULTIVITAMIN WITH MINERALS) TABS tablet Take 1 tablet by mouth daily.   propranolol (INDERAL) 20 MG tablet TAKE 1 TABLET BY MOUTH TWICE A DAY (Patient taking differently: Take 20 mg by mouth 2 (two) times daily.)   QUEtiapine (SEROQUEL) 50 MG tablet Take 2 tablets (100 mg total) by mouth at bedtime.   SUMAtriptan (IMITREX) 50 MG tablet 1 TABLET DAILY , MAY REPEAT IN 2 HOURS IF HEADACHE PERSISTS OR RECURS. (Patient taking differently: Take 50 mg by mouth every 2 (two) hours as needed for headache or migraine. 1 tablet daily , May repeat in 2 hours if headache persists or recurs.)    triamterene-hydrochlorothiazide (MAXZIDE-25) 37.5-25 MG tablet TAKE 1 TABLET BY MOUTH EVERY DAY (Patient taking differently: Take 1 tablet by mouth daily.)   zolpidem (AMBIEN) 10 MG tablet Take 1 tablet (10 mg total) by mouth at bedtime as needed for sleep.     Allergies:   Co-trimoxazole [sulfamethoxazole-trimethoprim], Lisinopril, and Sulfa antibiotics   Social History   Socioeconomic History   Marital status: Married    Spouse name: Not on file   Number of children: Not on file   Years of education: Not on file   Highest education level: Not on file  Occupational History   Not on file  Tobacco Use   Smoking status: Former    Types: Cigarettes    Quit date: 03/07/1988    Years since quitting: 34.1   Smokeless tobacco: Former  Building services engineer Use: Never used  Substance and Sexual Activity   Alcohol use: Not Currently   Drug use: Not Currently   Sexual activity: Yes  Other Topics Concern   Not on file  Social History Narrative   Not on file   Social Determinants of Health   Financial Resource Strain: Not on file  Food Insecurity: Not on file  Transportation Needs: Not on file  Physical Activity: Not on file  Stress: Not on file  Social Connections: Not on file     Family History: The patient's family history includes Colon cancer in his maternal grandfather; Congestive Heart Failure in his maternal grandfather; Dementia in his paternal grandmother; Healthy in his daughter, sister, and son; Heart attack in his paternal grandfather; Heart disease in his paternal grandfather; Hepatitis in his paternal aunt; Hepatitis C in his paternal aunt; Hyperlipidemia in his father, maternal uncle, and paternal uncle; Hypertension in his maternal uncle, mother, and paternal uncle; Prostate cancer in his maternal grandfather. ROS:   Please see the history of present illness.    All 14 point review of systems negative except as described per history of present  illness  EKGs/Labs/Other Studies Reviewed:      Recent Labs: 09/25/2021: ALT 20; BUN 18; Creatinine, Ser 1.15; Hemoglobin 12.6; Platelets 258.0; Potassium 4.1; Sodium 141; TSH 0.50  Recent Lipid Panel    Component Value Date/Time   CHOL 153 12/13/2021 0926   TRIG 95 12/13/2021 0926   HDL 41 12/13/2021 0926   CHOLHDL 3.7 12/13/2021 0926   CHOLHDL 4 06/14/2021 0826   VLDL 30.6 06/14/2021 0826   LDLCALC 94 12/13/2021 0926   LDLDIRECT 81.0 10/19/2019 0740    Physical Exam:    VS:  BP 118/82 (BP Location: Left Arm, Patient Position: Sitting)   Pulse 66   Ht 5\' 10"  (1.778 m)   Wt 195 lb (88.5 kg)   SpO2 94%   BMI 27.98 kg/m     Wt Readings from Last  3 Encounters:  04/10/22 195 lb (88.5 kg)  01/31/22 197 lb (89.4 kg)  12/15/21 201 lb 12.8 oz (91.5 kg)     GEN:  Well nourished, well developed in no acute distress HEENT: Normal NECK: No JVD; No carotid bruits LYMPHATICS: No lymphadenopathy CARDIAC: RRR, no murmurs, no rubs, no gallops RESPIRATORY:  Clear to auscultation without rales, wheezing or rhonchi  ABDOMEN: Soft, non-tender, non-distended MUSCULOSKELETAL:  No edema; No deformity  SKIN: Warm and dry LOWER EXTREMITIES: no swelling NEUROLOGIC:  Alert and oriented x 3 PSYCHIATRIC:  Normal affect   ASSESSMENT:    1. Coronary artery calcification   2. Essential hypertension   3. Dyspnea on exertion   4. Dyslipidemia    PLAN:    In order of problems listed above:  Coronary disease currently in form of coronary artery calcification but nonobstructive as proven by stress test being negative.  We will continue risk factors modifications Essential hypertension blood pressure well controlled continue present management. Dyslipidemia I did review K PN done in April showing LDL of 94 HDL 41 however he is cholesterol medication has been increased.  We will recheck his fasting lipid profile today. We spent great deal of time talking about risk factors modifications, with  talking about need to exercise on the regular basis he gets at least 10,000 steps every single day, on top of that it we discussed basic of Mediterranean diet.  His wife was present during the conversation.   Medication Adjustments/Labs and Tests Ordered: Current medicines are reviewed at length with the patient today.  Concerns regarding medicines are outlined above.  No orders of the defined types were placed in this encounter.  Medication changes: No orders of the defined types were placed in this encounter.   Signed, Georgeanna Lea, MD, [facility] 04/10/2022 9:57 AM    [facility]

## 2022-04-10 NOTE — Patient Instructions (Signed)
Medication Instructions:  Your physician recommends that you continue on your current medications as directed. Please refer to the Current Medication list given to you today.  *If you need a refill on your cardiac medications before your next appointment, please call your pharmacy*   Lab Work: Your physician recommends that you return for lab work in: Today for a fasting lipid panel  If you have labs (blood work) drawn today and your tests are completely normal, you will receive your results only by: MyChart Message (if you have MyChart) OR A paper copy in the mail If you have any lab test that is abnormal or we need to change your treatment, we will call you to review the results.   Testing/Procedures: NONE   Follow-Up: At [facility], you and your health needs are our priority.  As part of our continuing mission to provide you with exceptional heart care, we have created designated Provider Care Teams.  These Care Teams include your primary Cardiologist (physician) and Advanced Practice Providers (APPs -  Physician Assistants and Nurse Practitioners) who all work together to provide you with the care you need, when you need it.  We recommend signing up for the patient portal called "MyChart".  Sign up information is provided on this After Visit Summary.  MyChart is used to connect with patients for Virtual Visits (Telemedicine).  Patients are able to view lab/test results, encounter notes, upcoming appointments, etc.  Non-urgent messages can be sent to your provider as well.   To learn more about what you can do with MyChart, go to [email or web address].    Your next appointment:   1 year(s)  The format for your next appointment:   In Person  Provider:   Gypsy Balsam, MD    Other Instructions   Important Information About Sugar

## 2022-04-11 LAB — LIPID PANEL
Chol/HDL Ratio: 4 ratio (ref 0.0–5.0)
Cholesterol, Total: 169 mg/dL (ref 100–199)
HDL: 42 mg/dL (ref >39)
LDL Chol Calc (NIH): 100 mg/dL — ABNORMAL HIGH (ref 0–99)
Triglycerides: 155 mg/dL — ABNORMAL HIGH (ref 0–149)
VLDL Cholesterol Cal: 27 mg/dL (ref 5–40)

## 2022-04-19 ENCOUNTER — Other Ambulatory Visit: Payer: Self-pay | Admitting: Medical

## 2022-04-19 ENCOUNTER — Telehealth: Payer: Self-pay

## 2022-04-19 DIAGNOSIS — E785 Hyperlipidemia, unspecified: Secondary | ICD-10-CM

## 2022-04-19 NOTE — Telephone Encounter (Signed)
Patient notified of results and confirmed is is taking Lipitor regularly. Aware of needed blood work and to be fasting.  Order on file

## 2022-04-19 NOTE — Telephone Encounter (Signed)
-----   Message from Robert J Krasowski, MD sent at 04/13/2022  9:56 AM EDT ----- Cholesterol is not good.  Actually still be higher than before.  Please make sure he takes 80 mg of Lipitor and his cholesterol need to be rechecked in 3 months 

## 2022-04-19 NOTE — Telephone Encounter (Signed)
Patient notified of results.

## 2022-04-19 NOTE — Telephone Encounter (Signed)
-----   Message from Georgeanna Lea, MD sent at 04/13/2022  9:56 AM EDT ----- Cholesterol is not good.  Actually still be higher than before.  Please make sure he takes 80 mg of Lipitor and his cholesterol need to be rechecked in 3 months

## 2022-05-29 ENCOUNTER — Encounter: Payer: Self-pay | Admitting: Medical

## 2022-06-03 ENCOUNTER — Other Ambulatory Visit: Payer: Self-pay | Admitting: Medical

## 2022-06-12 ENCOUNTER — Other Ambulatory Visit: Payer: Self-pay | Admitting: Medical

## 2022-06-25 ENCOUNTER — Other Ambulatory Visit: Payer: Self-pay | Admitting: Medical

## 2022-08-15 ENCOUNTER — Encounter: Payer: Self-pay | Admitting: Medical

## 2022-08-16 MED ORDER — FLUTICASONE-SALMETEROL 230-21 MCG/ACT IN AERO: 2.0000 | INHALATION_SPRAY | Freq: Two times a day (BID) | RESPIRATORY_TRACT | 12 refills | Status: DC

## 2022-08-16 NOTE — Addendum Note (Signed)
Addended by: Gwenevere Abbot on: 08/16/2022 04:50 PM   Modules accepted: Orders

## 2022-09-01 ENCOUNTER — Other Ambulatory Visit: Payer: Self-pay | Admitting: Medical

## 2022-09-06 ENCOUNTER — Emergency Department (HOSPITAL_BASED_OUTPATIENT_CLINIC_OR_DEPARTMENT_OTHER): Payer: BC Managed Care – PPO

## 2022-09-06 ENCOUNTER — Other Ambulatory Visit: Payer: Self-pay

## 2022-09-06 ENCOUNTER — Emergency Department (HOSPITAL_BASED_OUTPATIENT_CLINIC_OR_DEPARTMENT_OTHER)
Admission: EM | Admit: 2022-09-06 | Discharge: 2022-09-07 | Disposition: A | Discharge status: Home / Self Care | Payer: BC Managed Care – PPO | Attending: Emergency Medicine | Admitting: Emergency Medicine

## 2022-09-06 ENCOUNTER — Encounter (HOSPITAL_BASED_OUTPATIENT_CLINIC_OR_DEPARTMENT_OTHER): Payer: Self-pay | Admitting: Emergency Medicine

## 2022-09-06 DIAGNOSIS — Z1152 Encounter for screening for COVID-19: Secondary | ICD-10-CM | POA: Diagnosis not present

## 2022-09-06 DIAGNOSIS — R052 Subacute cough: Secondary | ICD-10-CM | POA: Insufficient documentation

## 2022-09-06 DIAGNOSIS — Z7951 Long term (current) use of inhaled steroids: Secondary | ICD-10-CM | POA: Insufficient documentation

## 2022-09-06 DIAGNOSIS — R0981 Nasal congestion: Secondary | ICD-10-CM | POA: Insufficient documentation

## 2022-09-06 DIAGNOSIS — J45909 Unspecified asthma, uncomplicated: Secondary | ICD-10-CM | POA: Diagnosis not present

## 2022-09-06 DIAGNOSIS — R6883 Chills (without fever): Secondary | ICD-10-CM | POA: Diagnosis not present

## 2022-09-06 DIAGNOSIS — R0602 Shortness of breath: Secondary | ICD-10-CM | POA: Diagnosis not present

## 2022-09-06 LAB — RESP PANEL BY RT-PCR (RSV, FLU A&B, COVID)  RVPGX2
Influenza A by PCR: NEGATIVE
Influenza B by PCR: NEGATIVE
Resp Syncytial Virus by PCR: NEGATIVE
SARS Coronavirus 2 by RT PCR: NEGATIVE

## 2022-09-06 NOTE — ED Triage Notes (Signed)
Patient c/o cough, nasal congestion, chills, and shortness of breath since 12/15.

## 2022-09-07 ENCOUNTER — Telehealth: Payer: Self-pay

## 2022-09-07 MED ORDER — DOXYCYCLINE HYCLATE 100 MG PO TABS
100.0000 mg | ORAL_TABLET | Freq: Once | ORAL | Status: AC
  Administered 2022-09-07: 100 mg via ORAL
  Filled 2022-09-07: qty 1

## 2022-09-07 MED ORDER — DOXYCYCLINE HYCLATE 100 MG PO CAPS: 100.0000 mg | ORAL_CAPSULE | Freq: Two times a day (BID) | ORAL | 0 refills | Status: DC

## 2022-09-07 MED ORDER — PREDNISONE 10 MG PO TABS
60.0000 mg | ORAL_TABLET | Freq: Once | ORAL | Status: AC
  Administered 2022-09-07: 60 mg via ORAL
  Filled 2022-09-07: qty 1

## 2022-09-07 MED ORDER — PREDNISONE 10 MG (21) PO TBPK
ORAL_TABLET | ORAL | 0 refills | Status: DC
Start: 2022-09-07 — End: 2022-09-18

## 2022-09-07 NOTE — Telephone Encounter (Signed)
Pt seen in ED 

## 2022-09-07 NOTE — Telephone Encounter (Signed)
Nurse Assessment Nurse: Lynn Ito RN, Mardella Layman Date/Time (Eastern Time): 09/06/2022 6:11:53 PM Confirm and document reason for call. If symptomatic, describe symptoms. ---Caller states they need appt they have a persistent cough, shortness of breath for the past 2 weeks. Caller states he's lost sense taste. Caller states he has COVID tested last week and was negative. Does the patient have any new or worsening symptoms? ---Yes Will a triage be completed? ---Yes Related visit to physician within the last 2 weeks? ---No Does the PT have any chronic conditions? (i.e. diabetes, asthma, this includes High risk factors for pregnancy, etc.) ---Yes List chronic conditions. ---high blood pressure and high cholesterol. Partially paralyzed left diaphragm Is this a behavioral health or substance abuse call? ---No Guidelines Guideline Title Affirmed Question Affirmed Notes Nurse Date/Time (Eastern Time) Cough - Acute Productive [1] MILD difficulty breathing (e.g., minimal/no SOB at rest, SOB with walking, pulse <100) AND [2] still present when not coughing Popejoy, RN, Mardella Layman 09/06/2022 6:14:10 PM PLEASE NOTE: All timestamps contained within this report are represented as [place] Standard Time. CONFIDENTIALTY NOTICE: This fax transmission is intended only for the addressee. It contains information that is legally privileged, confidential or otherwise protected from use or disclosure. If you are not the intended recipient, you are strictly prohibited from reviewing, disclosing, copying using or disseminating any of this information or taking any action in reliance on or regarding this information. If you have received this fax in error, please notify us immediately by telephone so that we can arrange for its return to [place]. Phone: [phone], Toll-Free: [phone], Fax: [phone] Page: 2 of 2 Call Id: [id number] Disp. Time George Ward Time) Disposition Final User 09/06/2022 6:10:36 PM  Send to Urgent Queue Sheets, Ciara 09/06/2022 6:16:29 PM See HCP within 4 Hours (or PCP triage) Yes Lynn Ito, RN, Mardella Layman Final Disposition 09/06/2022 6:16:29 PM See HCP within 4 Hours (or PCP triage) Yes Popejoy, RN, Verdis Frederickson Disagree/Comply Comply Caller Understands Yes PreDisposition Call Doctor Care Advice Given Per Guideline SEE HCP (OR PCP TRIAGE) WITHIN 4 HOURS: * IF OFFICE WILL BE CLOSED AND NO PCP (PRIMARY CARE PROVIDER) SECOND-LEVEL TRIAGE: You need to be seen within the next 3 or 4 hours. A nearby Urgent Care Center [facility]) is often a good source of care. Another choice is to go to the ED. Go sooner if you become worse. * ED: Patients who may need surgery or hospital admission need to be sent to an ED. So do most patients with serious symptoms or complex medical problems. * UCC: Some UCCs can manage patients who are stable and have less serious symptoms (e.g., minor illnesses and injuries). The triager must know the [facility] capabilities before sending a patient there. If unsure, call ahead. CALL BACK IF: * You become worse CARE ADVICE given per Cough - Acute Productive (Adult) guideline. Referrals [facility] - ED

## 2022-09-07 NOTE — ED Provider Notes (Signed)
[facility] EMERGENCY DEPARTMENT  Provider Note  CSN: [id number] Arrival date & time: 09/06/22 1832  History Chief Complaint  Patient presents with   Cough    George Ward is a 51 y.o. male with history of asthma on symbicort and albuterol reports 2 weeks of cough, SOB, nasal congestion and chills. Thinks he ran a fever early on but has continued to cough without much improvement over the intervening time.   Home Medications Prior to Admission medications   Medication Sig Start Date End Date Taking? Authorizing Provider  doxycycline (VIBRAMYCIN) 100 MG capsule Take 1 capsule (100 mg total) by mouth 2 (two) times daily. 09/07/22  Yes Pollyann Savoy, MD  predniSONE (STERAPRED UNI-PAK 21 TAB) 10 MG (21) TBPK tablet 10mg  Tabs, 6 day taper. Use as directed 09/07/22  Yes 09/09/22, MD  acetaminophen (TYLENOL) 500 MG tablet Take 1,000 mg by mouth daily as needed for moderate pain or headache.    [provider]  albuterol (PROVENTIL HFA;VENTOLIN HFA) 108 (90 Base) MCG/ACT inhaler Inhale 1-2 puffs into the lungs every 6 (six) hours as needed for wheezing or shortness of breath. Ventolin    [provider]  atorvastatin (LIPITOR) 80 MG tablet Take 0.5 tablets (40 mg total) by mouth daily. 12/15/21   02/14/22, MD  calcium carbonate (TUMS - DOSED IN MG ELEMENTAL CALCIUM) 500 MG chewable tablet Chew 2 tablets by mouth daily as needed for indigestion or heartburn.    [provider]  fenofibrate micronized (LOFIBRA) 67 MG capsule TAKE 1 CAPSULE (67 MG TOTAL) BY MOUTH DAILY BEFORE BREAKFAST. 09/02/22   Saguier, 09/04/22, PA-C  FLUoxetine (PROZAC) 40 MG capsule Take 2 capsules (80 mg total) by mouth daily. 04/23/18 04/10/22  06/10/22, MD  fluticasone [facility]) 50 MCG/ACT nasal spray SPRAY 2 SPRAYS INTO EACH NOSTRIL EVERY DAY 06/12/22   Saguier, 08/12/22, PA-C  fluticasone-salmeterol (ADVAIR HFA) 230-21 MCG/ACT inhaler Inhale 2 puffs  into the lungs 2 (two) times daily. 08/16/22   Saguier, 14/7/23, PA-C  ibuprofen (ADVIL,MOTRIN) 200 MG tablet Take 400 mg by mouth daily as needed for headache or moderate pain.    [provider]  ipratropium (ATROVENT) 0.03 % nasal spray PLACE 2 SPRAYS INTO BOTH NOSTRILS EVERY 12 (TWELVE) HOURS. 02/22/22   02/24/22, MD  loratadine (CLARITIN) 10 MG tablet Take 10 mg by mouth daily as needed for allergies.    [provider]  LORazepam (ATIVAN) 0.5 MG tablet Take 1 tablet (0.5 mg total) by mouth 2 (two) times daily as needed for anxiety. 04/23/18   Eksir, 04/25/18, MD  Multiple Vitamin (MULTIVITAMIN WITH MINERALS) TABS tablet Take 1 tablet by mouth daily.    [provider]  propranolol (INDERAL) 20 MG tablet TAKE 1 TABLET BY MOUTH TWICE A DAY Patient taking differently: Take 20 mg by mouth 2 (two) times daily. 02/22/22   Saguier, 02/24/22, PA-C  QUEtiapine (SEROQUEL) 50 MG tablet Take 2 tablets (100 mg total) by mouth at bedtime. 04/23/18 04/10/22  06/10/22, MD  SUMAtriptan (IMITREX) 50 MG tablet 1 TABLET DAILY , MAY REPEAT IN 2 HOURS IF HEADACHE PERSISTS OR RECURS. 06/25/22   Saguier, 06/27/22, PA-C  triamterene-hydrochlorothiazide (MAXZIDE-25) 37.5-25 MG tablet TAKE 1 TABLET BY MOUTH EVERY DAY 06/04/22   Saguier, 06/06/22, PA-C  zolpidem (AMBIEN) 10 MG tablet Take 1 tablet (10 mg total) by mouth at bedtime as needed for sleep. 05/01/18 04/10/22  06/10/22, Rene Kocher, MD  Allergies    Co-trimoxazole [sulfamethoxazole-trimethoprim], Lisinopril, and Sulfa antibiotics   Review of Systems   Review of Systems Please see HPI for pertinent positives and negatives  Physical Exam BP (!) 157/106 (BP Location: Right Arm)   Pulse 65   Temp 98.2 F (36.8 C) (Oral)   Resp 20   Ht 5\' 10"  (1.778 m)   Wt 86.2 kg   SpO2 95%   BMI 27.26 kg/m   Physical Exam Vitals and nursing note reviewed.  Constitutional:      Appearance: Normal appearance.  HENT:      Head: Normocephalic and atraumatic.     Nose: Nose normal.     Mouth/Throat:     Mouth: Mucous membranes are moist.  Eyes:     Extraocular Movements: Extraocular movements intact.     Conjunctiva/sclera: Conjunctivae normal.  Cardiovascular:     Rate and Rhythm: Normal rate.  Pulmonary:     Effort: Pulmonary effort is normal.     Breath sounds: Rhonchi present. No wheezing or rales.  Abdominal:     General: Abdomen is flat.     Palpations: Abdomen is soft.     Tenderness: There is no abdominal tenderness.  Musculoskeletal:        General: No swelling. Normal range of motion.     Cervical back: Neck supple.  Skin:    General: Skin is warm and dry.  Neurological:     General: No focal deficit present.     Mental Status: He is alert.  Psychiatric:        Mood and Affect: Mood normal.     ED Results / Procedures / Treatments   EKG None  Procedures Procedures  Medications Ordered in the ED Medications  predniSONE (DELTASONE) tablet 60 mg (has no administration in time range)  doxycycline (VIBRA-TABS) tablet 100 mg (has no administration in time range)    Initial Impression and Plan  Patient here with persistent cough x 2 weeks. Exam and vitals are reassuring. 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 Patient reports pulmonologist wasn't sure either. Possible atelectasis vs infiltrate in that area. Nasal swab is neg. Will plan on short course of steroids and doxycycline. Recommend Pulm follow up if not improving. RTED for any other concerns.   ED Course       MDM Rules/Calculators/A&P Medical Decision Making Problems Addressed: Subacute cough: chronic illness or injury with exacerbation, progression, or side effects of treatment  Amount and/or Complexity of Data Reviewed Labs: ordered. Decision-making details documented in ED  Course. Radiology: ordered and independent interpretation performed. Decision-making details documented in ED Course.  Risk Prescription drug management.    Final Clinical Impression(s) / ED Diagnoses Final diagnoses:  Subacute cough    Rx / DC Orders ED Discharge Orders          Ordered    doxycycline (VIBRAMYCIN) 100 MG capsule  2 times daily        09/07/22 0016    predniSONE (STERAPRED UNI-PAK 21 TAB) 10 MG (21) TBPK tablet        09/07/22 0016             09/09/22, MD 09/07/22 [phone]

## 2022-09-09 ENCOUNTER — Other Ambulatory Visit: Payer: Self-pay | Admitting: Pulmonary Disease

## 2022-09-09 DIAGNOSIS — J4530 Mild persistent asthma, uncomplicated: Secondary | ICD-10-CM

## 2022-09-10 ENCOUNTER — Other Ambulatory Visit: Payer: Self-pay | Admitting: Medical

## 2022-09-12 ENCOUNTER — Telehealth: Payer: Self-pay | Admitting: Medical

## 2022-09-12 NOTE — Telephone Encounter (Signed)
Pt's insurance doesn't cover [facility] [id number], pharmacy requesting to change to [facility] [id number], is this okay?

## 2022-09-12 NOTE — Telephone Encounter (Signed)
Rx sent 

## 2022-09-15 ENCOUNTER — Encounter: Payer: Self-pay | Admitting: Medical

## 2022-09-18 ENCOUNTER — Ambulatory Visit (INDEPENDENT_AMBULATORY_CARE_PROVIDER_SITE_OTHER): Payer: BC Managed Care – PPO | Admitting: Family

## 2022-09-18 ENCOUNTER — Other Ambulatory Visit: Payer: Self-pay | Admitting: Medical

## 2022-09-18 VITALS — BP 145/101 | HR 72 | Temp 98.8°F | Resp 16 | Wt 191.0 lb

## 2022-09-18 DIAGNOSIS — J019 Acute sinusitis, unspecified: Secondary | ICD-10-CM | POA: Insufficient documentation

## 2022-09-18 DIAGNOSIS — J45909 Unspecified asthma, uncomplicated: Secondary | ICD-10-CM | POA: Insufficient documentation

## 2022-09-18 DIAGNOSIS — I10 Essential (primary) hypertension: Secondary | ICD-10-CM | POA: Diagnosis not present

## 2022-09-18 MED ORDER — AMOXICILLIN-POT CLAVULANATE 875-125 MG PO TABS: 1.0000 | ORAL_TABLET | Freq: Two times a day (BID) | ORAL | 0 refills | Status: DC

## 2022-09-18 MED ORDER — AMLODIPINE BESYLATE 5 MG PO TABS: 5.0000 mg | ORAL_TABLET | Freq: Every day | ORAL | 3 refills | Status: DC

## 2022-09-18 NOTE — Progress Notes (Signed)
Subjective:   By signing my name below, I, George Ward, attest that this documentation has been prepared under the direction and in the presence of Debbrah Alar, NP. 09/18/2022   Patient ID: George Ward, male    DOB: January 03, 1971, 52 y.o.   MRN: [id number]  Chief Complaint  Patient presents with   Cough    Complains of persistent cough, was seen in ED 09/06/22.    Facial Pain    Complains of sinus pain and congestion    Cough Associated symptoms include a sore throat.   Patient is in today for a office visit.   Cough: He complains of cough, sore throat, congestion, maxillary sinus pressure, ear fullness since 08/24/2022. He seen the ER on 09/06/2022 and was given doxycycline and steroid. He was given a chest X-ray and found no new issues. Note was again made of some elevation of his left hemidiaphram with atelectasis. This has previously been evaluated with CT by his pulmonologist. His cough has improved with doxycycline and prednisone but has not resolved.  His other symptoms have not improved. He has a history of asthma and continues using Advair regularly. He has used albuterol to manage his symptoms and finds it helps his SOB but does not improve his cough.   Blood pressure: His blood pressure is elevated during this visit. He continues taking 37.5-25 mg Maxzide-25 daily PO and 20 mg propanolol and reports no new issues while taking it.  BP Readings from Last 3 Encounters:  09/18/22 (!) 145/101  09/07/22 (!) 153/101  04/10/22 118/82   Pulse Readings from Last 3 Encounters:  09/18/22 72  09/07/22 60  04/10/22 66    Past Medical History:  Diagnosis Date   Alcohol abuse    Remission >20 years ago   Allergy    Anxiety    Asthma    Depression    MAJOR DEPRESSIVE DISORDER   Depression with anxiety    Drug abuse ([place])    Remission >20 years ago   Encounter for HIV (human immunodeficiency virus) test 2014-2015   Environmental allergies    History of chicken pox     Hyperlipidemia    Hypertension    Hypoalphalipoproteinemia, familial    Lumbar pain    Migraines    Primary generalized (osteo)arthritis    Seasonal allergies     Past Surgical History:  Procedure Laterality Date   EXCISION MASS HEAD  03/17/2018   excision cells on head  ; still in testing for cancer per patient report    LUMBAR EPIDURAL INJECTION     UMBILICAL HERNIA REPAIR N/A 04/02/2018   Procedure: OPEN UMBILICAL HERNIA REPAIR WITH INSERTION OF MESH;  Surgeon: Ileana Roup, MD;  Location: [facility];  Service: General;  Laterality: N/A;   WISDOM TOOTH EXTRACTION  1989-1990    Family History  Problem Relation Age of Onset   Hypertension Mother    Hyperlipidemia Father    Healthy Sister    Colon cancer Maternal Grandfather    Prostate cancer Maternal Grandfather    Congestive Heart Failure Maternal Grandfather    Dementia Paternal Grandmother    Heart disease Paternal Grandfather    Heart attack Paternal Grandfather    Hypertension Maternal Uncle    Hyperlipidemia Maternal Uncle    Hepatitis Paternal Aunt    Hepatitis C Paternal Aunt    Hypertension Paternal Uncle    Hyperlipidemia Paternal Uncle    Healthy Son        x1  Healthy Daughter        x4    Social History   Socioeconomic History   Marital status: Married    Spouse name: Not on file   Number of children: Not on file   Years of education: Not on file   Highest education level: Not on file  Occupational History   Not on file  Tobacco Use   Smoking status: Former    Types: Cigarettes    Quit date: 03/07/1988    Years since quitting: 34.5   Smokeless tobacco: Former  Building services engineer Use: Never used  Substance and Sexual Activity   Alcohol use: Not Currently   Drug use: Not Currently   Sexual activity: Yes  Other Topics Concern   Not on file  Social History Narrative   Not on file   Social Determinants of Health   Financial Resource Strain: Not on file  Food Insecurity: Not on  file  Transportation Needs: Not on file  Physical Activity: Not on file  Stress: Not on file  Social Connections: Not on file  Intimate Partner Violence: Not on file    Outpatient Medications Prior to Visit  Medication Sig Dispense Refill   acetaminophen (TYLENOL) 500 MG tablet Take 1,000 mg by mouth daily as needed for moderate pain or headache.     albuterol (PROVENTIL HFA;VENTOLIN HFA) 108 (90 Base) MCG/ACT inhaler Inhale 1-2 puffs into the lungs every 6 (six) hours as needed for wheezing or shortness of breath. Ventolin     atorvastatin (LIPITOR) 80 MG tablet Take 0.5 tablets (40 mg total) by mouth daily. 90 tablet 3   calcium carbonate (TUMS - DOSED IN MG ELEMENTAL CALCIUM) 500 MG chewable tablet Chew 2 tablets by mouth daily as needed for indigestion or heartburn.     fenofibrate micronized (LOFIBRA) 67 MG capsule TAKE 1 CAPSULE (67 MG TOTAL) BY MOUTH DAILY BEFORE BREAKFAST. 90 capsule 3   fluticasone (FLONASE) 50 MCG/ACT nasal spray SPRAY 2 SPRAYS INTO EACH NOSTRIL EVERY DAY 48 mL 1   fluticasone-salmeterol (WIXELA INHUB) 500-50 MCG/ACT AEPB Inhale 2 puffs into the lungs in the morning and at bedtime. 60 each 5   ibuprofen (ADVIL,MOTRIN) 200 MG tablet Take 400 mg by mouth daily as needed for headache or moderate pain.     ipratropium (ATROVENT) 0.03 % nasal spray PLACE 2 SPRAYS INTO BOTH NOSTRILS EVERY 12 (TWELVE) HOURS. 90 mL 0   loratadine (CLARITIN) 10 MG tablet Take 10 mg by mouth daily as needed for allergies.     LORazepam (ATIVAN) 0.5 MG tablet Take 1 tablet (0.5 mg total) by mouth 2 (two) times daily as needed for anxiety. 60 tablet 1   Multiple Vitamin (MULTIVITAMIN WITH MINERALS) TABS tablet Take 1 tablet by mouth daily.     propranolol (INDERAL) 20 MG tablet TAKE 1 TABLET BY MOUTH TWICE A DAY (Patient taking differently: Take 20 mg by mouth 2 (two) times daily.) 180 tablet 1   SUMAtriptan (IMITREX) 50 MG tablet 1 TABLET DAILY , MAY REPEAT IN 2 HOURS IF HEADACHE PERSISTS OR  RECURS. 9 tablet 2   triamterene-hydrochlorothiazide (MAXZIDE-25) 37.5-25 MG tablet TAKE 1 TABLET BY MOUTH EVERY DAY 90 tablet 1   predniSONE (STERAPRED UNI-PAK 21 TAB) 10 MG (21) TBPK tablet 10mg  Tabs, 6 day taper. Use as directed 1 each 0   FLUoxetine (PROZAC) 40 MG capsule Take 2 capsules (80 mg total) by mouth daily. 180 capsule 1   QUEtiapine (SEROQUEL) 50 MG tablet  Take 2 tablets (100 mg total) by mouth at bedtime. 180 tablet 1   zolpidem (AMBIEN) 10 MG tablet Take 1 tablet (10 mg total) by mouth at bedtime as needed for sleep. 30 tablet 1   doxycycline (VIBRAMYCIN) 100 MG capsule Take 1 capsule (100 mg total) by mouth 2 (two) times daily. 20 capsule 0   No facility-administered medications prior to visit.    Allergies  Allergen Reactions   Co-Trimoxazole [Sulfamethoxazole-Trimethoprim] Nausea And Vomiting   Lisinopril Cough    With Wheezing    Sulfa Antibiotics Nausea And Vomiting    Review of Systems  HENT:  Positive for congestion and sore throat.        (+)sinus pressure  (+)ear fullness  Respiratory:  Positive for cough.        Objective:    Physical Exam Constitutional:      General: He is not in acute distress.    Appearance: Normal appearance. He is not ill-appearing.  HENT:     Head: Normocephalic and atraumatic.     Right Ear: Tympanic membrane, ear canal and external ear normal.     Left Ear: Tympanic membrane, ear canal and external ear normal.     Nose:     Right Sinus: Maxillary sinus tenderness and frontal sinus tenderness present.     Left Sinus: Maxillary sinus tenderness and frontal sinus tenderness present.     Mouth/Throat:     Mouth: Mucous membranes are moist.     Pharynx: Oropharynx is clear. No oropharyngeal exudate or posterior oropharyngeal erythema.  Eyes:     Extraocular Movements: Extraocular movements intact.     Pupils: Pupils are equal, round, and reactive to light.  Cardiovascular:     Rate and Rhythm: Normal rate and regular  rhythm.     Heart sounds: Normal heart sounds. No murmur heard.    No gallop.  Pulmonary:     Effort: Pulmonary effort is normal. No respiratory distress.     Breath sounds: Normal breath sounds. No wheezing or rales.  Skin:    General: Skin is warm and dry.  Neurological:     Mental Status: He is alert and oriented to person, place, and time.  Psychiatric:        Judgment: Judgment normal.     BP (!) 145/101 (BP Location: Right Arm, Patient Position: Sitting, Cuff Size: Small)   Pulse 72   Temp 98.8 F (37.1 C) (Oral)   Resp 16   Wt 191 lb (86.6 kg)   SpO2 100%   BMI 27.41 kg/m  Wt Readings from Last 3 Encounters:  09/18/22 191 lb (86.6 kg)  09/06/22 190 lb (86.2 kg)  04/10/22 195 lb (88.5 kg)       Assessment & Plan:  Essential hypertension Assessment & Plan: BP Readings from Last 3 Encounters:  09/18/22 (!) 145/101  09/07/22 (!) 153/101  04/10/22 118/82   Maintained on low dose propranolol and maxide.  Uncontrolled.  Will add amlodipine 5mg  once daily.    Acute sinusitis, recurrence not specified, unspecified location Assessment & Plan: New.  Will rx with augmentin. Recommended nasal saline spray and flonase.  Call if symptoms worsen or if symptoms do not improve.     Uncomplicated asthma, unspecified asthma severity, unspecified whether persistent Assessment & Plan: Continue advair, prn albuterol. If his cough does not continue to improve he will need to follow up with his pulmonologist.    Other orders -     Amoxicillin-Pot Clavulanate; Take  1 tablet by mouth 2 (two) times daily.  Dispense: 20 tablet; Refill: 0 -     amLODIPine Besylate; Take 1 tablet (5 mg total) by mouth daily.  Dispense: 30 tablet; Refill: 3    I, Lemont Fillers, NP, personally preformed the services described in this documentation.  All medical record entries made by the scribe were at my direction and in my presence.  I have reviewed the chart and discharge instructions (if  applicable) and agree that the record reflects my personal performance and is accurate and complete. 09/18/2022   I,George Ward,acting as a Neurosurgeon for Lemont Fillers, NP.,have documented all relevant documentation on the behalf of Lemont Fillers, NP,as directed by  Lemont Fillers, NP while in the presence of Lemont Fillers, NP.   Lemont Fillers, NP

## 2022-09-18 NOTE — Assessment & Plan Note (Signed)
Continue advair, prn albuterol. If his cough does not continue to improve he will need to follow up with his pulmonologist.

## 2022-09-18 NOTE — Patient Instructions (Signed)
Start augmentin for sinus infection. Add amlodipine 5mg  once daily for blood pressure.

## 2022-09-18 NOTE — Assessment & Plan Note (Signed)
New.  Will rx with augmentin. Recommended nasal saline spray and flonase.  Call if symptoms worsen or if symptoms do not improve.

## 2022-09-18 NOTE — Assessment & Plan Note (Addendum)
BP Readings from Last 3 Encounters:  09/18/22 (!) 145/101  09/07/22 (!) 153/101  04/10/22 118/82   Maintained on low dose propranolol and maxide.  Uncontrolled.  Will add amlodipine 5mg  once daily.

## 2022-09-19 ENCOUNTER — Other Ambulatory Visit: Payer: Self-pay | Admitting: Medical

## 2022-09-21 ENCOUNTER — Encounter: Payer: Self-pay | Admitting: Medical

## 2022-10-31 ENCOUNTER — Other Ambulatory Visit: Payer: Self-pay | Admitting: Medical

## 2022-10-31 ENCOUNTER — Other Ambulatory Visit: Payer: Self-pay | Admitting: Family

## 2022-11-22 ENCOUNTER — Other Ambulatory Visit: Payer: Self-pay | Admitting: *Deleted

## 2022-11-22 MED ORDER — IPRATROPIUM BROMIDE 0.03 % NA SOLN
2.0000 | Freq: Two times a day (BID) | NASAL | 0 refills | Status: DC
Start: 2022-11-22 — End: 2023-02-27

## 2022-12-13 ENCOUNTER — Other Ambulatory Visit: Payer: Self-pay | Admitting: Medical

## 2023-01-09 ENCOUNTER — Other Ambulatory Visit: Payer: Self-pay | Admitting: Medical

## 2023-02-25 ENCOUNTER — Other Ambulatory Visit: Payer: Self-pay | Admitting: Medical

## 2023-02-27 ENCOUNTER — Other Ambulatory Visit: Payer: Self-pay | Admitting: Pulmonary Disease

## 2023-02-27 ENCOUNTER — Other Ambulatory Visit: Payer: Self-pay | Admitting: Cardiology

## 2023-02-27 DIAGNOSIS — E785 Hyperlipidemia, unspecified: Secondary | ICD-10-CM

## 2023-02-27 DIAGNOSIS — I2510 Atherosclerotic heart disease of native coronary artery without angina pectoris: Secondary | ICD-10-CM

## 2023-03-08 ENCOUNTER — Other Ambulatory Visit: Payer: Self-pay | Admitting: Medical

## 2023-03-15 ENCOUNTER — Ambulatory Visit (HOSPITAL_BASED_OUTPATIENT_CLINIC_OR_DEPARTMENT_OTHER)
Admission: RE | Admit: 2023-03-15 | Discharge: 2023-03-15 | Disposition: A | Discharge status: Home / Self Care | Payer: BC Managed Care – PPO | Source: Ambulatory Visit | Attending: Family | Admitting: Family

## 2023-03-15 ENCOUNTER — Ambulatory Visit: Payer: BC Managed Care – PPO | Admitting: Family

## 2023-03-15 VITALS — BP 140/86 | HR 73 | Temp 98.0°F | Resp 18 | Ht 70.0 in | Wt 200.0 lb

## 2023-03-15 DIAGNOSIS — R3911 Hesitancy of micturition: Secondary | ICD-10-CM | POA: Insufficient documentation

## 2023-03-15 DIAGNOSIS — R109 Unspecified abdominal pain: Secondary | ICD-10-CM | POA: Insufficient documentation

## 2023-03-15 LAB — POCT URINALYSIS DIPSTICK
Bilirubin, UA: NEGATIVE
Blood, UA: NEGATIVE
Glucose, UA: NEGATIVE
Ketones, UA: NEGATIVE
Nitrite, UA: NEGATIVE
Protein, UA: NEGATIVE
Spec Grav, UA: 1.015 (ref 1.010–1.025)
Urobilinogen, UA: 0.2 E.U./dL
pH, UA: 5 (ref 5.0–8.0)

## 2023-03-15 MED ORDER — NITROFURANTOIN MONOHYD MACRO 100 MG PO CAPS: 100.0000 mg | ORAL_CAPSULE | Freq: Two times a day (BID) | ORAL | 0 refills | Status: AC

## 2023-03-15 NOTE — Progress Notes (Addendum)
Subjective:     Patient ID: George Ward, male    DOB: Jun 29, 1971, 52 y.o.   MRN: [id number]  No chief complaint on file.   HPI  Discussed the use of AI scribe software for clinical note transcription with the patient, who gave verbal consent to proceed.  History of Present Illness   The patient, with no known history of prostate issues, presents with urinary symptoms that started a couple of weeks ago. Initially, the patient experienced a delay in starting urination and a weak urinary stream. They also reported a sensation of incomplete bladder emptying. A few days ago, the patient began experiencing urinary urgency, feeling the need to urinate frequently. The urgency has decreased slightly today, but the patient still reports a delay in starting urination. Yesterday, the patient developed bilateral lower back pain. The patient denies having a fever.      Health Maintenance Due  Topic Date Due   Hepatitis C Screening  Never done   Colonoscopy  Never done   Zoster Vaccines- Shingrix (1 of 2) Never done   COVID-19 Vaccine (4 - 2023-24 season) 05/11/2022    Past Medical History:  Diagnosis Date   Alcohol abuse    Remission >20 years ago   Allergy    Anxiety    Asthma    Depression    MAJOR DEPRESSIVE DISORDER   Depression with anxiety    Drug abuse (HCC)    Remission >20 years ago   Encounter for HIV (human immunodeficiency virus) test 2014-2015   Environmental allergies    History of chicken pox    Hyperlipidemia    Hypertension    Hypoalphalipoproteinemia, familial    Lumbar pain    Migraines    Primary generalized (osteo)arthritis    Seasonal allergies     Past Surgical History:  Procedure Laterality Date   EXCISION MASS HEAD  03/17/2018   excision cells on head  ; still in testing for cancer per patient report    LUMBAR EPIDURAL INJECTION     UMBILICAL HERNIA REPAIR N/A 04/02/2018   Procedure: OPEN UMBILICAL HERNIA REPAIR WITH INSERTION OF MESH;   Surgeon: Andria Meuse, MD;  Location: [facility];  Service: General;  Laterality: N/A;   WISDOM TOOTH EXTRACTION  1989-1990    Family History  Problem Relation Age of Onset   Hypertension Mother    Hyperlipidemia Father    Healthy Sister    Colon cancer Maternal Grandfather    Prostate cancer Maternal Grandfather    Congestive Heart Failure Maternal Grandfather    Dementia Paternal Grandmother    Heart disease Paternal Grandfather    Heart attack Paternal Grandfather    Hypertension Maternal Uncle    Hyperlipidemia Maternal Uncle    Hepatitis Paternal Aunt    Hepatitis C Paternal Aunt    Hypertension Paternal Uncle    Hyperlipidemia Paternal Uncle    Healthy Son        x1   Healthy Daughter        x4    Social History   Socioeconomic History   Marital status: Married    Spouse name: Not on file   Number of children: Not on file   Years of education: Not on file   Highest education level: Master's degree (e.g., MA, MS, MEng, MEd, MSW, MBA)  Occupational History   Not on file  Tobacco Use   Smoking status: Former    Types: Cigarettes    Quit date: 03/07/1988  Years since quitting: 35.0   Smokeless tobacco: Former  Building services engineer Use: Never used  Substance and Sexual Activity   Alcohol use: Not Currently   Drug use: Not Currently   Sexual activity: Yes  Other Topics Concern   Not on file  Social History Narrative   Not on file   Social Determinants of Health   Financial Resource Strain: Low Risk  (03/13/2023)   Overall Financial Resource Strain (CARDIA)    Difficulty of Paying Living Expenses: Not hard at all  Food Insecurity: No Food Insecurity (03/13/2023)   Hunger Vital Sign    Worried About Running Out of Food in the Last Year: Never true    Ran Out of Food in the Last Year: Never true  Transportation Needs: No Transportation Needs (03/13/2023)   PRAPARE - Administrator, Civil Service (Medical): No    Lack of Transportation  (Non-Medical): No  Physical Activity: Sufficiently Active (03/13/2023)   Exercise Vital Sign    Days of Exercise per Week: 5 days    Minutes of Exercise per Session: 60 min  Stress: Stress Concern Present (03/13/2023)   [employer] of Occupational Health - Occupational Stress Questionnaire    Feeling of Stress : To some extent  Social Connections: Moderately Isolated (03/13/2023)   Social Connection and Isolation Panel [NHANES]    Frequency of Communication with Friends and Family: Once a week    Frequency of Social Gatherings with Friends and Family: More than three times a week    Attends Religious Services: Never    Database administrator or Organizations: No    Attends Engineer, structural: Not on file    Marital Status: Married  Catering manager Violence: Not on file    Outpatient Medications Prior to Visit  Medication Sig Dispense Refill   acetaminophen (TYLENOL) 500 MG tablet Take 1,000 mg by mouth daily as needed for moderate pain or headache.     albuterol (PROVENTIL HFA;VENTOLIN HFA) 108 (90 Base) MCG/ACT inhaler Inhale 1-2 puffs into the lungs every 6 (six) hours as needed for wheezing or shortness of breath. Ventolin     amLODipine (NORVASC) 5 MG tablet TAKE 1 TABLET (5 MG TOTAL) BY MOUTH DAILY. 90 tablet 0   atorvastatin (LIPITOR) 80 MG tablet TAKE 0.5 TABLETS BY MOUTH DAILY. 45 tablet 1   calcium carbonate (TUMS - DOSED IN MG ELEMENTAL CALCIUM) 500 MG chewable tablet Chew 2 tablets by mouth daily as needed for indigestion or heartburn.     fenofibrate micronized (LOFIBRA) 67 MG capsule TAKE 1 CAPSULE (67 MG TOTAL) BY MOUTH DAILY BEFORE BREAKFAST. 90 capsule 3   FLUoxetine (PROZAC) 40 MG capsule Take 2 capsules (80 mg total) by mouth daily. 180 capsule 1   fluticasone (FLONASE) 50 MCG/ACT nasal spray SPRAY 2 SPRAYS INTO EACH NOSTRIL EVERY DAY 48 mL 1   ibuprofen (ADVIL,MOTRIN) 200 MG tablet Take 400 mg by mouth daily as needed for headache or moderate pain.      ipratropium (ATROVENT) 0.03 % nasal spray PLACE 2 SPRAYS INTO BOTH NOSTRILS EVERY 12 (TWELVE) HOURS. 90 mL 1   loratadine (CLARITIN) 10 MG tablet Take 10 mg by mouth daily as needed for allergies.     LORazepam (ATIVAN) 0.5 MG tablet Take 1 tablet (0.5 mg total) by mouth 2 (two) times daily as needed for anxiety. 60 tablet 1   Multiple Vitamin (MULTIVITAMIN WITH MINERALS) TABS tablet Take 1 tablet by mouth daily.  propranolol (INDERAL) 20 MG tablet TAKE 1 TABLET BY MOUTH TWICE A DAY 180 tablet 1   QUEtiapine (SEROQUEL) 50 MG tablet Take 2 tablets (100 mg total) by mouth at bedtime. 180 tablet 1   SUMAtriptan (IMITREX) 50 MG tablet 1 TABLET DAILY , MAY REPEAT IN 2 HOURS IF HEADACHE PERSISTS OR RECURS. 9 tablet 2   triamterene-hydrochlorothiazide (MAXZIDE-25) 37.5-25 MG tablet TAKE 1 TABLET BY MOUTH EVERY DAY 90 tablet 1   WIXELA INHUB 500-50 MCG/ACT AEPB INHALE 2 PUFFS INTO THE LUNGS IN THE MORNING AND AT BEDTIME. 60 each 5   zolpidem (AMBIEN) 10 MG tablet Take 1 tablet (10 mg total) by mouth at bedtime as needed for sleep. 30 tablet 1   amoxicillin-clavulanate (AUGMENTIN) 875-125 MG tablet Take 1 tablet by mouth 2 (two) times daily. 20 tablet 0   No facility-administered medications prior to visit.    Allergies  Allergen Reactions   Co-Trimoxazole [Sulfamethoxazole-Trimethoprim] Nausea And Vomiting   Lisinopril Cough    With Wheezing    Sulfa Antibiotics Nausea And Vomiting    ROS     Objective:    Physical Exam Constitutional:      General: He is not in acute distress.    Appearance: He is well-developed.  HENT:     Head: Normocephalic and atraumatic.  Cardiovascular:     Rate and Rhythm: Normal rate and regular rhythm.     Heart sounds: No murmur heard. Pulmonary:     Effort: Pulmonary effort is normal. No respiratory distress.     Breath sounds: Normal breath sounds. No wheezing or rales.  Abdominal:     Tenderness: There is right CVA tenderness (mild) and left CVA  tenderness (more tender than right).     Comments: Mild suprapubic discomfort with palpation Abdomen is soft  + tenderness to palpation LLQ/LUQ  Skin:    General: Skin is warm and dry.  Neurological:     Mental Status: He is alert and oriented to person, place, and time.  Psychiatric:        Behavior: Behavior normal.        Thought Content: Thought content normal.      BP (!) 140/86   Pulse 73   Temp 98 F (36.7 C)   Resp 18   Ht 5\' 10"  (1.778 m)   Wt 200 lb (90.7 kg)   SpO2 100%   BMI 28.70 kg/m  Wt Readings from Last 3 Encounters:  03/15/23 200 lb (90.7 kg)  09/18/22 191 lb (86.6 kg)  09/06/22 190 lb (86.2 kg)       Assessment & Plan:   Problem List Items Addressed This Visit       Unprioritized   Urinary hesitancy - Primary    UA notes + leuks.  Will send for culture and begin macrobid.  I would like to check his PSA.  May need to consider treatment for BPH and possible Urology referral if voiding difficulty does not improve.       Relevant Orders   Urine Culture   POCT Urinalysis Dipstick (Completed)   PSA   CBC w/Diff   Basic Metabolic Panel (BMET)   Flank pain    Obtain kub to evaluate or kidney stone.       Relevant Orders   DG Abd 2 Views    I have discontinued Loraine Leriche D. Tennant's amoxicillin-clavulanate. I am also having him start on nitrofurantoin (macrocrystal-monohydrate). Additionally, I am having him maintain his albuterol, multivitamin with minerals, acetaminophen,  ibuprofen, calcium carbonate, loratadine, QUEtiapine, LORazepam, FLUoxetine, zolpidem, fenofibrate micronized, triamterene-hydrochlorothiazide, fluticasone, propranolol, amLODipine, ipratropium, atorvastatin, Wixela Inhub, and SUMAtriptan.  Meds ordered this encounter  Medications   nitrofurantoin, macrocrystal-monohydrate, (MACROBID) 100 MG capsule    Sig: Take 1 capsule (100 mg total) by mouth 2 (two) times daily for 7 days.    Dispense:  14 capsule    Refill:  0    Order  Specific Question:   Supervising Provider    Answer:   Danise Edge A [[id number]]

## 2023-03-15 NOTE — Assessment & Plan Note (Signed)
UA notes + leuks.  Will send for culture and begin macrobid.  I would like to check his PSA.  May need to consider treatment for BPH and possible Urology referral if voiding difficulty does not improve.

## 2023-03-15 NOTE — Assessment & Plan Note (Signed)
Obtain kub to evaluate or kidney stone.

## 2023-03-16 LAB — URINE CULTURE
MICRO NUMBER:: 15164776
Result:: NO GROWTH
SPECIMEN QUALITY:: ADEQUATE

## 2023-03-18 ENCOUNTER — Telehealth: Payer: Self-pay | Admitting: Family

## 2023-03-18 NOTE — Telephone Encounter (Signed)
Contacted pt re: negative urine culture.  He states that urgency, back pain have all resolved. Advised pt to let me know if he develops recurrent symptoms.

## 2023-03-25 ENCOUNTER — Encounter: Payer: Self-pay | Admitting: Cardiology

## 2023-03-25 ENCOUNTER — Ambulatory Visit: Payer: BC Managed Care – PPO | Admitting: Medical

## 2023-03-25 VITALS — BP 126/80 | HR 70 | Temp 98.0°F | Resp 18 | Ht 70.0 in | Wt 200.8 lb

## 2023-03-25 DIAGNOSIS — Z125 Encounter for screening for malignant neoplasm of prostate: Secondary | ICD-10-CM | POA: Diagnosis not present

## 2023-03-25 DIAGNOSIS — Z1211 Encounter for screening for malignant neoplasm of colon: Secondary | ICD-10-CM | POA: Diagnosis not present

## 2023-03-25 DIAGNOSIS — Z23 Encounter for immunization: Secondary | ICD-10-CM | POA: Diagnosis not present

## 2023-03-25 DIAGNOSIS — Z0000 Encounter for general adult medical examination without abnormal findings: Secondary | ICD-10-CM

## 2023-03-25 NOTE — Progress Notes (Signed)
Subjective:    Patient ID: George Ward, male    DOB: 09-30-1970, 52 y.o.   MRN: [id number]  HPI Pt in for wellness exam. Pt at breakfast at 6 am. Oatmeal only and small oj glass.  Works as Runner, broadcasting/film/video. Pt has been working out/walking less recently. Less recently with heat. States eating healthy overall. One cup coffee a day.   Hx of smoking in past. Pt states in past very sporadic and light smoker.   Pt has not had colonoscopy though did put in order for that in 2022. Pt states go busy and did not get that done.  Pt will get shingrix vaccine.      Review of Systems  Constitutional:  Negative for chills, fatigue and fever.  HENT:  Negative for congestion, ear discharge and ear pain.   Respiratory:  Negative for cough, chest tightness, shortness of breath and wheezing.   Cardiovascular:  Negative for chest pain and palpitations.  Gastrointestinal:  Negative for abdominal pain, constipation, diarrhea, rectal pain and vomiting.  Genitourinary:  Negative for dysuria, frequency, penile pain and testicular pain.  Musculoskeletal:  Negative for back pain and neck pain.  Skin:  Negative for rash.  Neurological:  Negative for dizziness, seizures, weakness and headaches.  Hematological:  Negative for adenopathy.    Past Medical History:  Diagnosis Date   Alcohol abuse    Remission >20 years ago   Allergy    Anxiety    Asthma    Depression    MAJOR DEPRESSIVE DISORDER   Depression with anxiety    Drug abuse (HCC)    Remission >20 years ago   Encounter for HIV (human immunodeficiency virus) test 2014-2015   Environmental allergies    History of chicken pox    Hyperlipidemia    Hypertension    Hypoalphalipoproteinemia, familial    Lumbar pain    Migraines    Primary generalized (osteo)arthritis    Seasonal allergies      Social History   Socioeconomic History   Marital status: Married    Spouse name: Not on file   Number of children: Not on file   Years of  education: Not on file   Highest education level: Master's degree (e.g., MA, MS, MEng, MEd, MSW, MBA)  Occupational History   Not on file  Tobacco Use   Smoking status: Former    Current packs/day: 0.00    Types: Cigarettes    Quit date: 03/07/1988    Years since quitting: 35.0   Smokeless tobacco: Former  Building services engineer status: Never Used  Substance and Sexual Activity   Alcohol use: Not Currently   Drug use: Not Currently   Sexual activity: Yes  Other Topics Concern   Not on file  Social History Narrative   Not on file   Social Determinants of Health   Financial Resource Strain: Low Risk  (03/13/2023)   Overall Financial Resource Strain (CARDIA)    Difficulty of Paying Living Expenses: Not hard at all  Food Insecurity: No Food Insecurity (03/13/2023)   Hunger Vital Sign    Worried About Running Out of Food in the Last Year: Never true    Ran Out of Food in the Last Year: Never true  Transportation Needs: No Transportation Needs (03/13/2023)   PRAPARE - Administrator, Civil Service (Medical): No    Lack of Transportation (Non-Medical): No  Physical Activity: Sufficiently Active (03/13/2023)   Exercise Vital Sign  Days of Exercise per Week: 5 days    Minutes of Exercise per Session: 60 min  Stress: Stress Concern Present (03/13/2023)   [employer] of Occupational Health - Occupational Stress Questionnaire    Feeling of Stress : To some extent  Social Connections: Moderately Isolated (03/13/2023)   Social Connection and Isolation Panel [NHANES]    Frequency of Communication with Friends and Family: Once a week    Frequency of Social Gatherings with Friends and Family: More than three times a week    Attends Religious Services: Never    Database administrator or Organizations: No    Attends Engineer, structural: Not on file    Marital Status: Married  Catering manager Violence: Not on file    Past Surgical History:  Procedure Laterality Date    EXCISION MASS HEAD  03/17/2018   excision cells on head  ; still in testing for cancer per patient report    LUMBAR EPIDURAL INJECTION     UMBILICAL HERNIA REPAIR N/A 04/02/2018   Procedure: OPEN UMBILICAL HERNIA REPAIR WITH INSERTION OF MESH;  Surgeon: Andria Meuse, MD;  Location: [facility];  Service: General;  Laterality: N/A;   WISDOM TOOTH EXTRACTION  1989-1990    Family History  Problem Relation Age of Onset   Hypertension Mother    Hyperlipidemia Father    Healthy Sister    Colon cancer Maternal Grandfather    Prostate cancer Maternal Grandfather    Congestive Heart Failure Maternal Grandfather    Dementia Paternal Grandmother    Heart disease Paternal Grandfather    Heart attack Paternal Grandfather    Hypertension Maternal Uncle    Hyperlipidemia Maternal Uncle    Hepatitis Paternal Aunt    Hepatitis C Paternal Aunt    Hypertension Paternal Uncle    Hyperlipidemia Paternal Uncle    Healthy Son        x1   Healthy Daughter        x4    Allergies  Allergen Reactions   Co-Trimoxazole [Sulfamethoxazole-Trimethoprim] Nausea And Vomiting   Lisinopril Cough    With Wheezing    Sulfa Antibiotics Nausea And Vomiting    Current Outpatient Medications on File Prior to Visit  Medication Sig Dispense Refill   acetaminophen (TYLENOL) 500 MG tablet Take 1,000 mg by mouth daily as needed for moderate pain or headache.     albuterol (PROVENTIL HFA;VENTOLIN HFA) 108 (90 Base) MCG/ACT inhaler Inhale 1-2 puffs into the lungs every 6 (six) hours as needed for wheezing or shortness of breath. Ventolin     amLODipine (NORVASC) 5 MG tablet TAKE 1 TABLET (5 MG TOTAL) BY MOUTH DAILY. 90 tablet 0   atorvastatin (LIPITOR) 80 MG tablet TAKE 0.5 TABLETS BY MOUTH DAILY. 45 tablet 1   calcium carbonate (TUMS - DOSED IN MG ELEMENTAL CALCIUM) 500 MG chewable tablet Chew 2 tablets by mouth daily as needed for indigestion or heartburn.     fenofibrate micronized (LOFIBRA) 67 MG capsule TAKE 1  CAPSULE (67 MG TOTAL) BY MOUTH DAILY BEFORE BREAKFAST. 90 capsule 3   FLUoxetine (PROZAC) 40 MG capsule Take 2 capsules (80 mg total) by mouth daily. 180 capsule 1   fluticasone (FLONASE) 50 MCG/ACT nasal spray SPRAY 2 SPRAYS INTO EACH NOSTRIL EVERY DAY 48 mL 1   ibuprofen (ADVIL,MOTRIN) 200 MG tablet Take 400 mg by mouth daily as needed for headache or moderate pain.     ipratropium (ATROVENT) 0.03 % nasal spray PLACE 2 SPRAYS  INTO BOTH NOSTRILS EVERY 12 (TWELVE) HOURS. 90 mL 1   loratadine (CLARITIN) 10 MG tablet Take 10 mg by mouth daily as needed for allergies.     LORazepam (ATIVAN) 0.5 MG tablet Take 1 tablet (0.5 mg total) by mouth 2 (two) times daily as needed for anxiety. 60 tablet 1   Multiple Vitamin (MULTIVITAMIN WITH MINERALS) TABS tablet Take 1 tablet by mouth daily.     propranolol (INDERAL) 20 MG tablet TAKE 1 TABLET BY MOUTH TWICE A DAY 180 tablet 1   QUEtiapine (SEROQUEL) 50 MG tablet Take 2 tablets (100 mg total) by mouth at bedtime. 180 tablet 1   SUMAtriptan (IMITREX) 50 MG tablet 1 TABLET DAILY , MAY REPEAT IN 2 HOURS IF HEADACHE PERSISTS OR RECURS. 9 tablet 2   triamterene-hydrochlorothiazide (MAXZIDE-25) 37.5-25 MG tablet TAKE 1 TABLET BY MOUTH EVERY DAY 90 tablet 1   WIXELA INHUB 500-50 MCG/ACT AEPB INHALE 2 PUFFS INTO THE LUNGS IN THE MORNING AND AT BEDTIME. 60 each 5   zolpidem (AMBIEN) 10 MG tablet Take 1 tablet (10 mg total) by mouth at bedtime as needed for sleep. 30 tablet 1   No current facility-administered medications on file prior to visit.    BP 126/80   Pulse 70   Temp 98 F (36.7 C)   Resp 18   Ht 5\' 10"  (1.778 m)   Wt 200 lb 12.8 oz (91.1 kg)   SpO2 97%   BMI 28.81 kg/m        Objective:   Physical Exam  General Mental Status- Alert. General Appearance- Not in acute distress.   Skin Scattered small mole and freckles. Tattoes present as well.  Neck Carotid Arteries- Normal color. Moisture- Normal Moisture. No carotid bruits. No  JVD.  Chest and Lung Exam Auscultation: Breath Sounds:-Normal.  Cardiovascular Auscultation:Rythm- Regular. Murmurs & Other Heart Sounds:Auscultation of the heart reveals- No Murmurs.  Abdomen Inspection:-Inspeection Normal. Palpation/Percussion:Note:No mass. Palpation and Percussion of the abdomen reveal- Non Tender, Non Distended + BS, no rebound or guarding.   Neurologic Cranial Nerve exam:- CN III-XII intact(No nystagmus), symmetric smile. Strength:- 5/5 equal and symmetric strength both upper and lower extremities.       Assessment & Plan:  For you wellness exam today I have ordered cbc, cmp , psa and lipid panel.  Shingrix vaccine today.  Please call Gi MD to get scheduled for colonoscopy.  Recommend exercise and healthy diet.  We will let you know lab results as they come in.  Follow up date appointment will be determined after lab review.   Esperanza Richters, PA-C

## 2023-03-25 NOTE — Patient Instructions (Signed)
For you wellness exam today I have ordered cbc, cmp , psa and lipid panel.  Shingrix vaccine today.  Please call Gi MD to get scheduled for colonoscopy.  Recommend exercise and healthy diet.  We will let you know lab results as they come in.  Follow up date appointment will be determined after lab review.    Preventive Care 77-53 Years Old, Male Preventive care refers to lifestyle choices and visits with your health care provider that can promote health and wellness. Preventive care visits are also called wellness exams. What can I expect for my preventive care visit? Counseling During your preventive care visit, your health care provider may ask about your: Medical history, including: Past medical problems. Family medical history. Current health, including: Emotional well-being. Home life and relationship well-being. Sexual activity. Lifestyle, including: Alcohol, nicotine or tobacco, and drug use. Access to firearms. Diet, exercise, and sleep habits. Safety issues such as seatbelt and bike helmet use. Sunscreen use. Work and work Astronomer. Physical exam Your health care provider will check your: Height and weight. These may be used to calculate your BMI (body mass index). BMI is a measurement that tells if you are at a healthy weight. Waist circumference. This measures the distance around your waistline. This measurement also tells if you are at a healthy weight and may help predict your risk of certain diseases, such as type 2 diabetes and high blood pressure. Heart rate and blood pressure. Body temperature. Skin for abnormal spots. What immunizations do I need?  Vaccines are usually given at various ages, according to a schedule. Your health care provider will recommend vaccines for you based on your age, medical history, and lifestyle or other factors, such as travel or where you work. What tests do I need? Screening Your health care provider may recommend screening  tests for certain conditions. This may include: Lipid and cholesterol levels. Diabetes screening. This is done by checking your blood sugar (glucose) after you have not eaten for a while (fasting). Hepatitis B test. Hepatitis C test. HIV (human immunodeficiency virus) test. STI (sexually transmitted infection) testing, if you are at risk. Lung cancer screening. Prostate cancer screening. Colorectal cancer screening. Talk with your health care provider about your test results, treatment options, and if necessary, the need for more tests. Follow these instructions at home: Eating and drinking  Eat a diet that includes fresh fruits and vegetables, whole grains, lean protein, and low-fat dairy products. Take vitamin and mineral supplements as recommended by your health care provider. Do not drink alcohol if your health care provider tells you not to drink. If you drink alcohol: Limit how much you have to 0-2 drinks a day. Know how much alcohol is in your drink. In the [place] one drink equals one 12 oz bottle of beer (355 mL), one 5 oz glass of wine (148 mL), or one 1 oz glass of hard liquor (44 mL). Lifestyle Brush your teeth every morning and night with fluoride toothpaste. Floss one time each day. Exercise for at least 30 minutes 5 or more days each week. Do not use any products that contain nicotine or tobacco. These products include cigarettes, chewing tobacco, and vaping devices, such as e-cigarettes. If you need help quitting, ask your health care provider. Do not use drugs. If you are sexually active, practice safe sex. Use a condom or other form of protection to prevent STIs. Take aspirin only as told by your health care provider. Make sure that you understand how much  to take and what form to take. Work with your health care provider to find out whether it is safe and beneficial for you to take aspirin daily. Find healthy ways to manage stress, such as: Meditation, yoga, or listening  to music. Journaling. Talking to a trusted person. Spending time with friends and family. Minimize exposure to UV radiation to reduce your risk of skin cancer. Safety Always wear your seat belt while driving or riding in a vehicle. Do not drive: If you have been drinking alcohol. Do not ride with someone who has been drinking. When you are tired or distracted. While texting. If you have been using any mind-altering substances or drugs. Wear a helmet and other protective equipment during sports activities. If you have firearms in your house, make sure you follow all gun safety procedures. What's next? Go to your health care provider once a year for an annual wellness visit. Ask your health care provider how often you should have your eyes and teeth checked. Stay up to date on all vaccines. This information is not intended to replace advice given to you by your health care provider. Make sure you discuss any questions you have with your health care provider. Document Revised: 02/22/2021 Document Reviewed: 02/22/2021 Elsevier Patient Education  2024 [employer].

## 2023-03-25 NOTE — Addendum Note (Signed)
Addended by: Maximino Sarin on: 03/25/2023 02:20 PM   Modules accepted: Orders

## 2023-03-26 LAB — PSA: PSA: 0.49 ng/mL (ref 0.10–4.00)

## 2023-03-26 LAB — COMPREHENSIVE METABOLIC PANEL
ALT: 19 U/L (ref 0–53)
AST: 17 U/L (ref 0–37)
Albumin: 4.5 g/dL (ref 3.5–5.2)
Alkaline Phosphatase: 67 U/L (ref 39–117)
BUN: 17 mg/dL (ref 6–23)
CO2: 31 mEq/L (ref 19–32)
Calcium: 10 mg/dL (ref 8.4–10.5)
Chloride: 102 mEq/L (ref 96–112)
Creatinine, Ser: 1.05 mg/dL (ref 0.40–1.50)
GFR: 81.49 mL/min (ref >60.00)
Glucose, Bld: 88 mg/dL (ref 70–99)
Potassium: 4.4 mEq/L (ref 3.5–5.1)
Sodium: 139 mEq/L (ref 135–145)
Total Bilirubin: 0.6 mg/dL (ref 0.2–1.2)
Total Protein: 6.7 g/dL (ref 6.0–8.3)

## 2023-03-26 LAB — CBC WITH DIFFERENTIAL/PLATELET
Basophils Absolute: 0 10*3/uL (ref 0.0–0.1)
Basophils Relative: 0.6 % (ref 0.0–3.0)
Eosinophils Absolute: 0.3 10*3/uL (ref 0.0–0.7)
Eosinophils Relative: 4.8 % (ref 0.0–5.0)
HCT: 41.8 % (ref 39.0–52.0)
Hemoglobin: 14 g/dL (ref 13.0–17.0)
Lymphocytes Relative: 26.4 % (ref 12.0–46.0)
Lymphs Abs: 1.9 10*3/uL (ref 0.7–4.0)
MCHC: 33.4 g/dL (ref 30.0–36.0)
MCV: 84.3 fl (ref 78.0–100.0)
Monocytes Absolute: 0.6 10*3/uL (ref 0.1–1.0)
Monocytes Relative: 8.5 % (ref 3.0–12.0)
Neutro Abs: 4.3 10*3/uL (ref 1.4–7.7)
Neutrophils Relative %: 59.7 % (ref 43.0–77.0)
Platelets: 258 10*3/uL (ref 150.0–400.0)
RBC: 4.96 Mil/uL (ref 4.22–5.81)
RDW: 14.4 % (ref 11.5–15.5)
WBC: 7.2 10*3/uL (ref 4.0–10.5)

## 2023-03-26 LAB — LIPID PANEL
Cholesterol: 144 mg/dL (ref 0–200)
HDL: 44.4 mg/dL (ref >39.00)
LDL Cholesterol: 79 mg/dL (ref 0–99)
NonHDL: 99.41
Total CHOL/HDL Ratio: 3
Triglycerides: 104 mg/dL (ref 0.0–149.0)
VLDL: 20.8 mg/dL (ref 0.0–40.0)

## 2023-04-03 ENCOUNTER — Encounter: Payer: Self-pay | Admitting: Internal Medicine

## 2023-04-24 ENCOUNTER — Encounter: Payer: Self-pay | Admitting: Internal Medicine

## 2023-04-24 ENCOUNTER — Ambulatory Visit (AMBULATORY_SURGERY_CENTER): Payer: BC Managed Care – PPO

## 2023-04-24 VITALS — Ht 70.0 in | Wt 195.0 lb

## 2023-04-24 DIAGNOSIS — Z1211 Encounter for screening for malignant neoplasm of colon: Secondary | ICD-10-CM

## 2023-04-24 MED ORDER — NA SULFATE-K SULFATE-MG SULF 17.5-3.13-1.6 GM/177ML PO SOLN: 1.0000 | Freq: Once | ORAL | 0 refills | Status: AC

## 2023-04-24 NOTE — Progress Notes (Signed)
 No egg or soy allergy known to patient  No issues known to pt with past sedation with any surgeries or procedures Patient denies ever being told they had issues or difficulty with intubation  No FH of Malignant Hyperthermia Pt is not on diet pills Pt is not on  home 02  Pt is not on blood thinners  Pt denies issues with chronic constipation  No A fib or A flutter Have any cardiac testing pending--no Patient's chart reviewed by Cathlyn Parsons CNRA prior to previsit and patient appropriate for the [facility].  Previsit completed and red dot placed by patient's name on their procedure day (on provider's schedule).   Ambulates independently Pt instructed to use Singlecare.com or GoodRx for a price reduction on prep

## 2023-05-07 ENCOUNTER — Ambulatory Visit: Payer: BC Managed Care – PPO | Admitting: Cardiology

## 2023-05-09 NOTE — Progress Notes (Signed)
[facility] History and Physical   Primary Care Physician:  Esperanza Richters, PA-C   Reason for Procedure:   CRCA screening  Plan:    colonoscopy     HPI: George Ward is a 52 y.o. male here for an initial screening colonoscopy   Past Medical History:  Diagnosis Date   Alcohol abuse    Remission >20 years ago   Allergy    Anxiety    Asthma    Cancer (HCC)    Depression    MAJOR DEPRESSIVE DISORDER   Depression with anxiety    Drug abuse (HCC)    Remission >20 years ago   Environmental allergies    History of chicken pox    Hyperlipidemia    Hypertension    Hypoalphalipoproteinemia, familial    Lumbar pain    Migraines    Primary generalized (osteo)arthritis    Seasonal allergies     Past Surgical History:  Procedure Laterality Date   EXCISION MASS HEAD  03/17/2018   excision cells on head  ; still in testing for cancer per patient report    LUMBAR EPIDURAL INJECTION     SHOULDER ARTHROSCOPY Left 2021   UMBILICAL HERNIA REPAIR N/A 04/02/2018   Procedure: OPEN UMBILICAL HERNIA REPAIR WITH INSERTION OF MESH;  Surgeon: Andria Meuse, MD;  Location: [facility];  Service: General;  Laterality: N/A;   WISDOM TOOTH EXTRACTION  1989-1990    Prior to Admission medications   Medication Sig Start Date End Date Taking? Authorizing Provider  acetaminophen (TYLENOL) 500 MG tablet Take 1,000 mg by mouth daily as needed for moderate pain or headache.    [provider]  albuterol (PROVENTIL HFA;VENTOLIN HFA) 108 (90 Base) MCG/ACT inhaler Inhale 1-2 puffs into the lungs every 6 (six) hours as needed for wheezing or shortness of breath. Ventolin    [provider]  amLODipine (NORVASC) 5 MG tablet TAKE 1 TABLET (5 MG TOTAL) BY MOUTH DAILY. 02/26/23   Saguier, Ramon Dredge, PA-C  atorvastatin (LIPITOR) 80 MG tablet TAKE 0.5 TABLETS BY MOUTH DAILY. 02/27/23   Georgeanna Lea, MD  buPROPion (WELLBUTRIN XL) 150 MG 24 hr tablet Take 150 mg by mouth  every morning. 02/01/23   [provider]  calcium carbonate (TUMS - DOSED IN MG ELEMENTAL CALCIUM) 500 MG chewable tablet Chew 2 tablets by mouth daily as needed for indigestion or heartburn.    [provider]  fenofibrate micronized (LOFIBRA) 67 MG capsule TAKE 1 CAPSULE (67 MG TOTAL) BY MOUTH DAILY BEFORE BREAKFAST. 09/02/22   Saguier, Ramon Dredge, PA-C  FLUoxetine (PROZAC) 40 MG capsule Take 2 capsules (80 mg total) by mouth daily. 04/23/18 04/24/23  Burnard Leigh, MD  fluticasone [facility]) 50 MCG/ACT nasal spray SPRAY 2 SPRAYS INTO EACH NOSTRIL EVERY DAY 10/31/22   Saguier, Ramon Dredge, PA-C  gabapentin (NEURONTIN) 300 MG capsule Take 300 mg by mouth 3 (three) times daily. 04/01/23   [provider]  ibuprofen (ADVIL,MOTRIN) 200 MG tablet Take 400 mg by mouth daily as needed for headache or moderate pain.    [provider]  ipratropium (ATROVENT) 0.03 % nasal spray PLACE 2 SPRAYS INTO BOTH NOSTRILS EVERY 12 (TWELVE) HOURS. 02/27/23   Martina Sinner, MD  loratadine (CLARITIN) 10 MG tablet Take 10 mg by mouth daily as needed for allergies.    [provider]  LORazepam (ATIVAN) 0.5 MG tablet Take 1 tablet (0.5 mg total) by mouth 2 (two) times daily as needed for anxiety. 04/23/18  Eksir, Bo Mcclintock, MD  Multiple Vitamin (MULTIVITAMIN WITH MINERALS) TABS tablet Take 1 tablet by mouth daily.    [provider]  propranolol (INDERAL) 20 MG tablet TAKE 1 TABLET BY MOUTH TWICE A DAY 01/10/23   Saguier, Ramon Dredge, PA-C  QUEtiapine (SEROQUEL) 50 MG tablet Take 2 tablets (100 mg total) by mouth at bedtime. 04/23/18 04/24/23  Burnard Leigh, MD  SUMAtriptan (IMITREX) 50 MG tablet 1 TABLET DAILY , MAY REPEAT IN 2 HOURS IF HEADACHE PERSISTS OR RECURS. 03/08/23   Saguier, Ramon Dredge, PA-C  triamterene-hydrochlorothiazide (MAXZIDE-25) 37.5-25 MG tablet TAKE 1 TABLET BY MOUTH EVERY DAY Patient not taking: Reported on 04/24/2023 10/31/22   Saguier, Ramon Dredge, PA-C   WIXELA INHUB 500-50 MCG/ACT AEPB INHALE 2 PUFFS INTO THE LUNGS IN THE MORNING AND AT BEDTIME. 03/08/23   Saguier, Ramon Dredge, PA-C  zolpidem (AMBIEN) 10 MG tablet Take 1 tablet (10 mg total) by mouth at bedtime as needed for sleep. 05/01/18 04/24/23  Burnard Leigh, MD    Current Outpatient Medications  Medication Sig Dispense Refill   amLODipine (NORVASC) 5 MG tablet TAKE 1 TABLET (5 MG TOTAL) BY MOUTH DAILY. 90 tablet 0   atorvastatin (LIPITOR) 80 MG tablet TAKE 0.5 TABLETS BY MOUTH DAILY. 45 tablet 1   buPROPion (WELLBUTRIN XL) 150 MG 24 hr tablet Take 150 mg by mouth every morning.     fenofibrate micronized (LOFIBRA) 67 MG capsule TAKE 1 CAPSULE (67 MG TOTAL) BY MOUTH DAILY BEFORE BREAKFAST. 90 capsule 3   FLUoxetine (PROZAC) 40 MG capsule Take 2 capsules (80 mg total) by mouth daily. 180 capsule 1   fluticasone (FLONASE) 50 MCG/ACT nasal spray SPRAY 2 SPRAYS INTO EACH NOSTRIL EVERY DAY 48 mL 1   ipratropium (ATROVENT) 0.03 % nasal spray PLACE 2 SPRAYS INTO BOTH NOSTRILS EVERY 12 (TWELVE) HOURS. 90 mL 1   loratadine (CLARITIN) 10 MG tablet Take 10 mg by mouth daily as needed for allergies.     LORazepam (ATIVAN) 0.5 MG tablet Take 1 tablet (0.5 mg total) by mouth 2 (two) times daily as needed for anxiety. 60 tablet 1   Multiple Vitamin (MULTIVITAMIN WITH MINERALS) TABS tablet Take 1 tablet by mouth daily.     propranolol (INDERAL) 20 MG tablet TAKE 1 TABLET BY MOUTH TWICE A DAY 180 tablet 1   QUEtiapine (SEROQUEL) 50 MG tablet Take 2 tablets (100 mg total) by mouth at bedtime. 180 tablet 1   triamterene-hydrochlorothiazide (MAXZIDE-25) 37.5-25 MG tablet TAKE 1 TABLET BY MOUTH EVERY DAY 90 tablet 1   WIXELA INHUB 500-50 MCG/ACT AEPB INHALE 2 PUFFS INTO THE LUNGS IN THE MORNING AND AT BEDTIME. 60 each 5   zolpidem (AMBIEN) 10 MG tablet Take 1 tablet (10 mg total) by mouth at bedtime as needed for sleep. 30 tablet 1   acetaminophen (TYLENOL) 500 MG tablet Take 1,000 mg by mouth daily as  needed for moderate pain or headache.     albuterol (PROVENTIL HFA;VENTOLIN HFA) 108 (90 Base) MCG/ACT inhaler Inhale 1-2 puffs into the lungs every 6 (six) hours as needed for wheezing or shortness of breath. Ventolin     calcium carbonate (TUMS - DOSED IN MG ELEMENTAL CALCIUM) 500 MG chewable tablet Chew 2 tablets by mouth daily as needed for indigestion or heartburn.     gabapentin (NEURONTIN) 300 MG capsule Take 300 mg by mouth 3 (three) times daily.     ibuprofen (ADVIL,MOTRIN) 200 MG tablet Take 400 mg by mouth daily as needed for headache or moderate  pain.     SUMAtriptan (IMITREX) 50 MG tablet 1 TABLET DAILY , MAY REPEAT IN 2 HOURS IF HEADACHE PERSISTS OR RECURS. 9 tablet 2   Current Facility-Administered Medications  Medication Dose Route Frequency Provider Last Rate Last Admin   0.9 %  sodium chloride infusion  500 mL Intravenous Once Iva Boop, MD        Allergies as of 05/10/2023 - Review Complete 05/10/2023  Allergen Reaction Noted   Co-trimoxazole [sulfamethoxazole-trimethoprim] Nausea And Vomiting 11/09/2016   Lisinopril Cough 11/07/2016   Sulfa antibiotics Nausea And Vomiting 04/02/2018    Family History  Problem Relation Age of Onset   Hypertension Mother    Hyperlipidemia Father    Healthy Sister    Hypertension Maternal Uncle    Hyperlipidemia Maternal Uncle    Hepatitis Paternal Aunt    Hepatitis C Paternal Aunt    Hypertension Paternal Uncle    Hyperlipidemia Paternal Uncle    Colon cancer Maternal Grandfather    Prostate cancer Maternal Grandfather    Congestive Heart Failure Maternal Grandfather    Dementia Paternal Grandmother    Heart disease Paternal Grandfather    Heart attack Paternal Grandfather    Healthy Daughter        x4   Healthy Son        x1   Esophageal cancer Neg Hx    Stomach cancer Neg Hx    Rectal cancer Neg Hx     Social History   Socioeconomic History   Marital status: Married    Spouse name: Not on file   Number of  children: Not on file   Years of education: Not on file   Highest education level: Master's degree (e.g., MA, MS, MEng, MEd, MSW, MBA)  Occupational History   Not on file  Tobacco Use   Smoking status: Former    Current packs/day: 0.00    Types: Cigarettes    Quit date: 03/07/1988    Years since quitting: 35.1   Smokeless tobacco: Former  Building services engineer status: Never Used  Substance and Sexual Activity   Alcohol use: Not Currently    Comment: 1/month   Drug use: Not Currently   Sexual activity: Yes  Other Topics Concern   Not on file  Social History Narrative   Not on file   Social Determinants of Health   Financial Resource Strain: Low Risk  (03/13/2023)   Overall Financial Resource Strain (CARDIA)    Difficulty of Paying Living Expenses: Not hard at all  Food Insecurity: No Food Insecurity (03/13/2023)   Hunger Vital Sign    Worried About Running Out of Food in the Last Year: Never true    Ran Out of Food in the Last Year: Never true  Transportation Needs: No Transportation Needs (03/13/2023)   PRAPARE - Administrator, Civil Service (Medical): No    Lack of Transportation (Non-Medical): No  Physical Activity: Sufficiently Active (03/13/2023)   Exercise Vital Sign    Days of Exercise per Week: 5 days    Minutes of Exercise per Session: 60 min  Stress: Stress Concern Present (03/13/2023)   [employer] of Occupational Health - Occupational Stress Questionnaire    Feeling of Stress : To some extent  Social Connections: Moderately Isolated (03/13/2023)   Social Connection and Isolation Panel [NHANES]    Frequency of Communication with Friends and Family: Once a week    Frequency of Social Gatherings with Friends and Family: More  than three times a week    Attends Religious Services: Never    Active Member of Clubs or Organizations: No    Attends Engineer, structural: Not on file    Marital Status: Married  Catering manager Violence: Not on file     Review of Systems:  All other review of systems negative except as mentioned in the HPI.  Physical Exam: Vital signs BP 121/74   Pulse 66   Temp 97.8 F (36.6 C) (Temporal)   Ht 5\' 10"  (1.778 m)   Wt 195 lb (88.5 kg)   SpO2 97%   BMI 27.98 kg/m   General:   Alert,  Well-developed, well-nourished, pleasant and cooperative in NAD Lungs:  Clear throughout to auscultation.   Heart:  Regular rate and rhythm; no murmurs, clicks, rubs,  or gallops. Abdomen:  Soft, nontender and nondistended. Normal bowel sounds.   Neuro/Psych:  Alert and cooperative. Normal mood and affect. A and O x 3   @Canden Cieslinski  Sena Slate, MD, [facility] [phone] (pager) 05/10/2023 1:08 PM@

## 2023-05-10 ENCOUNTER — Ambulatory Visit: Payer: BC Managed Care – PPO | Admitting: Internal Medicine

## 2023-05-10 ENCOUNTER — Encounter: Payer: Self-pay | Admitting: Internal Medicine

## 2023-05-10 VITALS — BP 94/73 | HR 64 | Temp 97.8°F | Resp 12 | Ht 70.0 in | Wt 195.0 lb

## 2023-05-10 DIAGNOSIS — Z1211 Encounter for screening for malignant neoplasm of colon: Secondary | ICD-10-CM | POA: Diagnosis present

## 2023-05-10 DIAGNOSIS — D120 Benign neoplasm of cecum: Secondary | ICD-10-CM | POA: Diagnosis not present

## 2023-05-10 DIAGNOSIS — K6389 Other specified diseases of intestine: Secondary | ICD-10-CM | POA: Diagnosis not present

## 2023-05-10 MED ORDER — SODIUM CHLORIDE 0.9 % IV SOLN: 500.0000 mL | Freq: Once | INTRAVENOUS | Status: DC

## 2023-05-10 NOTE — Progress Notes (Signed)
Uneventful anesthetic. Report to pacu rn. Vss. Care resumed by rn. 

## 2023-05-10 NOTE — Progress Notes (Signed)
Pt's states no medical or surgical changes since previsit or office visit. 

## 2023-05-10 NOTE — Progress Notes (Signed)
Called to room to assist during endoscopic procedure.  Patient ID and intended procedure confirmed with present staff. Received instructions for my participation in the procedure from the performing physician.  

## 2023-05-10 NOTE — Op Note (Signed)
[facility] Patient Name: George Ward Procedure Date: 05/10/2023 1:14 PM MRN: [id number] Endoscopist: Iva Boop , MD, [id number] Age: 52 Referring MD:  Date of Birth: Sep 01, 1971 Gender: Male Account #: [id number] Procedure:                Colonoscopy Indications:              Screening for colorectal malignant neoplasm, This                            is the patient's first colonoscopy Medicines:                Monitored Anesthesia Care Procedure:                Pre-Anesthesia Assessment:                           - Prior to the procedure, a History and Physical                            was performed, and patient medications and                            allergies were reviewed. The patient's tolerance of                            previous anesthesia was also reviewed. The risks                            and benefits of the procedure and the sedation                            options and risks were discussed with the patient.                            All questions were answered, and informed consent                            was obtained. Prior Anticoagulants: The patient has                            taken no anticoagulant or antiplatelet agents. ASA                            Grade Assessment: II - A patient with mild systemic                            disease. After reviewing the risks and benefits,                            the patient was deemed in satisfactory condition to                            undergo the procedure.  After obtaining informed consent, the colonoscope                            was passed under direct vision. Throughout the                            procedure, the patient's blood pressure, pulse, and                            oxygen saturations were monitored continuously. The                            CF [id number] #[id number] was introduced through the anus                            and advanced to the  the cecum, identified by                            appendiceal orifice and ileocecal valve. The                            colonoscopy was performed without difficulty. The                            patient tolerated the procedure well. The quality                            of the bowel preparation was good. The ileocecal                            valve, appendiceal orifice, and rectum were                            photographed. The bowel preparation used was SUPREP                            via split dose instruction. Scope In: 1:20:11 PM Scope Out: 1:31:57 PM Scope Withdrawal Time: 0 hours 7 minutes 59 seconds  Total Procedure Duration: 0 hours 11 minutes 46 seconds  Findings:                 The perianal and digital rectal examinations were                            normal.                           A 1 mm polyp was found in the appendiceal orifice.                            The polyp was sessile. The polyp was removed with a                            cold biopsy forceps. Resection and retrieval were  complete. Verification of patient identification                            for the specimen was done. Estimated blood loss was                            minimal.                           The exam was otherwise without abnormality on                            direct and retroflexion views. Complications:            No immediate complications. Estimated Blood Loss:     Estimated blood loss was minimal. Impression:               - One 1 mm polyp at the appendiceal orifice,                            removed with a cold biopsy forceps. Resected and                            retrieved.                           - The examination was otherwise normal on direct                            and retroflexion views. Recommendation:           - Patient has a contact number available for                            emergencies. The signs and symptoms of  potential                            delayed complications were discussed with the                            patient. Return to normal activities tomorrow.                            Written discharge instructions were provided to the                            patient.                           - Resume previous diet.                           - Continue present medications.                           - Repeat colonoscopy is recommended. The  colonoscopy date will be determined after pathology                            results from today's exam become available for                            review. Iva Boop, MD 05/10/2023 1:37:04 PM This report has been signed electronically.

## 2023-05-10 NOTE — Patient Instructions (Addendum)
I found and removed 1 very tiny polyp.   I will let you know pathology results and when to have another routine colonoscopy by mail and/or My Chart.  I appreciate the opportunity to care for you. Iva Boop, MD, [facility]   Recommendation:           - Patient has a contact number available for                            emergencies. The signs and symptoms of potential                            delayed complications were discussed with the                            patient. Return to normal activities tomorrow.                            Written discharge instructions were provided to the                            patient.                           - Resume previous diet.                           - Continue present medications.                           - Repeat colonoscopy is recommended. The                            colonoscopy date will be determined after pathology                            results from today's exam become available for                            review.  YOU HAD AN ENDOSCOPIC PROCEDURE TODAY AT THE [facility]:   Refer to the procedure report that was given to you for any specific questions about what was found during the examination.  If the procedure report does not answer your questions, please call your gastroenterologist to clarify.  If you requested that your care partner not be given the details of your procedure findings, then the procedure report has been included in a sealed envelope for you to review at your convenience later.  YOU SHOULD EXPECT: Some feelings of bloating in the abdomen. Passage of more gas than usual.  Walking can help get rid of the air that was put into your GI tract during the procedure and reduce the bloating. If you had a lower endoscopy (such as a colonoscopy or flexible sigmoidoscopy) you may notice spotting of blood in your stool or on the toilet paper. If you underwent a bowel prep for your procedure, you may not  have a normal bowel movement for a few days.  Please Note:  You might notice some irritation and  congestion in your nose or some drainage.  This is from the oxygen used during your procedure.  There is no need for concern and it should clear up in a day or so.  SYMPTOMS TO REPORT IMMEDIATELY:  Following lower endoscopy (colonoscopy or flexible sigmoidoscopy):  Excessive amounts of blood in the stool  Significant tenderness or worsening of abdominal pains  Swelling of the abdomen that is new, acute  Fever of 100F or higher  Handout on polyps given.  For urgent or emergent issues, a gastroenterologist can be reached at any hour by calling [phone]. Do not use MyChart messaging for urgent concerns.    DIET:  We do recommend a small meal at first, but then you may proceed to your regular diet.  Drink plenty of fluids but you should avoid alcoholic beverages for 24 hours.  ACTIVITY:  You should plan to take it easy for the rest of today and you should NOT DRIVE or use heavy machinery until tomorrow (because of the sedation medicines used during the test).    FOLLOW UP: Our staff will call the number listed on your records the next business day following your procedure.  We will call around 7:15- 8:00 am to check on you and address any questions or concerns that you may have regarding the information given to you following your procedure. If we do not reach you, we will leave a message.     If any biopsies were taken you will be contacted by phone or by letter within the next 1-3 weeks.  Please call us at [phone] if you have not heard about the biopsies in 3 weeks.    SIGNATURES/CONFIDENTIALITY: You and/or your care partner have signed paperwork which will be entered into your electronic medical record.  These signatures attest to the fact that that the information above on your After Visit Summary has been reviewed and is understood.  Full responsibility of the confidentiality  of this discharge information lies with you and/or your care-partner.

## 2023-05-14 ENCOUNTER — Encounter: Payer: Self-pay | Admitting: Medical

## 2023-05-14 ENCOUNTER — Telehealth: Payer: Self-pay | Admitting: *Deleted

## 2023-05-14 NOTE — Telephone Encounter (Signed)
  Follow up Call-     05/10/2023   12:41 PM  Call back number  Post procedure Call Back phone  # [phone]  Permission to leave phone message Yes     Patient questions:  Do you have a fever, pain , or abdominal swelling? No. Pain Score  0 *  Have you tolerated food without any problems? Yes.    Have you been able to return to your normal activities? Yes.    Do you have any questions about your discharge instructions: Diet   No. Medications  No. Follow up visit  No.  Do you have questions or concerns about your Care? No.  Actions: * If pain score is 4 or above: No action needed, pain <4.

## 2023-05-16 ENCOUNTER — Encounter: Payer: Self-pay | Admitting: Internal Medicine

## 2023-05-24 ENCOUNTER — Other Ambulatory Visit: Payer: Self-pay | Admitting: Medical

## 2023-05-30 ENCOUNTER — Ambulatory Visit (INDEPENDENT_AMBULATORY_CARE_PROVIDER_SITE_OTHER): Payer: BC Managed Care – PPO

## 2023-05-30 DIAGNOSIS — Z23 Encounter for immunization: Secondary | ICD-10-CM

## 2023-05-30 NOTE — Progress Notes (Signed)
Pt here today for 2nd Shingrix vaccine per Ramon Dredge.   Shingrix 0.36mL injected into L deltoid IM. Pt tolerated injection well.

## 2023-06-10 ENCOUNTER — Other Ambulatory Visit: Payer: Self-pay | Admitting: Medical

## 2023-06-11 MED ORDER — AMLODIPINE BESYLATE 5 MG PO TABS: 5.0000 mg | ORAL_TABLET | Freq: Every day | ORAL | 0 refills | Status: DC

## 2023-06-23 ENCOUNTER — Other Ambulatory Visit: Payer: Self-pay | Admitting: Medical

## 2023-06-28 ENCOUNTER — Other Ambulatory Visit (HOSPITAL_BASED_OUTPATIENT_CLINIC_OR_DEPARTMENT_OTHER): Payer: Self-pay

## 2023-06-28 MED ORDER — COVID-19 MRNA VAC-TRIS(PFIZER) 30 MCG/0.3ML IM SUSY
0.3000 mL | PREFILLED_SYRINGE | Freq: Once | INTRAMUSCULAR | 0 refills | Status: AC
  Filled 2023-06-28: qty 0.3, 1d supply, fill #0

## 2023-06-28 MED ORDER — INFLUENZA VIRUS VACC SPLIT PF (FLUZONE) 0.5 ML IM SUSY
0.5000 mL | PREFILLED_SYRINGE | Freq: Once | INTRAMUSCULAR | 0 refills | Status: AC
  Filled 2023-06-28: qty 0.5, 1d supply, fill #0

## 2023-07-10 ENCOUNTER — Encounter: Payer: Self-pay | Admitting: Medical

## 2023-07-10 ENCOUNTER — Ambulatory Visit: Payer: BC Managed Care – PPO | Admitting: Medical

## 2023-07-10 VITALS — BP 120/86 | HR 78 | Temp 98.2°F | Resp 18 | Ht 70.0 in | Wt 194.0 lb

## 2023-07-10 DIAGNOSIS — R059 Cough, unspecified: Secondary | ICD-10-CM | POA: Diagnosis not present

## 2023-07-10 DIAGNOSIS — J0110 Acute frontal sinusitis, unspecified: Secondary | ICD-10-CM

## 2023-07-10 DIAGNOSIS — J40 Bronchitis, not specified as acute or chronic: Secondary | ICD-10-CM

## 2023-07-10 DIAGNOSIS — R062 Wheezing: Secondary | ICD-10-CM | POA: Diagnosis not present

## 2023-07-10 LAB — POC COVID19 BINAXNOW: SARS Coronavirus 2 Ag: NEGATIVE

## 2023-07-10 MED ORDER — DOXYCYCLINE HYCLATE 100 MG PO TABS
100.0000 mg | ORAL_TABLET | Freq: Two times a day (BID) | ORAL | 0 refills | Status: DC
Start: 2023-07-10 — End: 2023-07-24

## 2023-07-10 MED ORDER — BENZONATATE 100 MG PO CAPS: 100.0000 mg | ORAL_CAPSULE | Freq: Three times a day (TID) | ORAL | 0 refills | Status: DC | PRN

## 2023-07-10 NOTE — Patient Instructions (Addendum)
Sinusitis Frontal sinus tenderness and congestion, non-productive cough, and sore throat for 4 days. No fever, chills, or sweats. History of sinus infections and bronchitis. -Continue Flonase and Atrovent nasal sprays. -Prescribe Doxycycline for sinusitis.  Bronchitis Non-productive cough and chest congestion. No wheezing or difficulty breathing. History of bronchitis. -Continue Wixela inhaler BID. -Prescribe Benzonatate for cough. -Use Albuterol as needed for breakthrough wheezing. -If repetitive wheezing occurs (Albuterol use every 6 hours), contact office for possible Prednisone taper.   Follow up 7-10 days or sooner if needed

## 2023-07-10 NOTE — Progress Notes (Signed)
Subjective:    Patient ID: George Ward, male    DOB: [birth year], 52 y.o.   MRN: [id number]  HPI  Discussed the use of AI scribe software for clinical note transcription with the patient, who gave verbal consent to proceed.  History of Present Illness   The patient, with a history of smoking cessation in the 1980s and seasonal allergies, presents with a four-day history of non-productive cough and chest congestion. He also reports sinus congestion and sore throat. The patient denies any fever, chills, or sweats, and does not report any difficulty breathing or wheezing. He has been using Flonase and Atrovent nasal sprays, as well as a Wixela inhaler twice daily. He has albuterol available for use if needed, both as an inhaler and for a home nebulizer. The patient has a history of sinus infections and bronchitis, and is hoping to prevent the progression of his current symptoms. He has not been sneezing recently and has not taken a COVID test. The patient's spouse has been sick with a sinus infection and is currently on antibiotics.       Review of Systems  Constitutional:  Negative for chills and fatigue.  HENT:  Positive for congestion, postnasal drip, sinus pain and sore throat. Negative for ear discharge and ear pain.   Respiratory:  Positive for cough. Negative for chest tightness, shortness of breath and wheezing.   Cardiovascular:  Negative for chest pain and palpitations.  Gastrointestinal:  Negative for abdominal pain.  Musculoskeletal:  Negative for back pain and myalgias.  Neurological:  Negative for dizziness and numbness.  Hematological:  Negative for adenopathy.    Past Medical History:  Diagnosis Date   Alcohol abuse    Remission >20 years ago   Allergy    Anxiety    Asthma    Cancer (HCC)    Depression    MAJOR DEPRESSIVE DISORDER   Depression with anxiety    Drug abuse (HCC)    Remission >20 years ago   Environmental allergies    History of chicken pox     Hyperlipidemia    Hypertension    Hypoalphalipoproteinemia, familial    Lumbar pain    Migraines    Primary generalized (osteo)arthritis    Seasonal allergies      Social History   Socioeconomic History   Marital status: Married    Spouse name: Not on file   Number of children: Not on file   Years of education: Not on file   Highest education level: Master's degree (e.g., MA, MS, MEng, MEd, MSW, MBA)  Occupational History   Not on file  Tobacco Use   Smoking status: Former    Current packs/day: 0.00    Types: Cigarettes    Quit date: 03/07/1988    Years since quitting: 35.3   Smokeless tobacco: Former  Building services engineer status: Never Used  Substance and Sexual Activity   Alcohol use: Not Currently    Comment: 1/month   Drug use: Not Currently   Sexual activity: Yes  Other Topics Concern   Not on file  Social History Narrative   Not on file   Social Determinants of Health   Financial Resource Strain: Low Risk  (03/13/2023)   Overall Financial Resource Strain (CARDIA)    Difficulty of Paying Living Expenses: Not hard at all  Food Insecurity: No Food Insecurity (03/13/2023)   Hunger Vital Sign    Worried About Running Out of Food in the Last Year: Never  true    Ran Out of Food in the Last Year: Never true  Transportation Needs: No Transportation Needs (03/13/2023)   PRAPARE - Administrator, Civil Service (Medical): No    Lack of Transportation (Non-Medical): No  Physical Activity: Sufficiently Active (03/13/2023)   Exercise Vital Sign    Days of Exercise per Week: 5 days    Minutes of Exercise per Session: 60 min  Stress: Stress Concern Present (03/13/2023)   [employer] of Occupational Health - Occupational Stress Questionnaire    Feeling of Stress : To some extent  Social Connections: Moderately Isolated (03/13/2023)   Social Connection and Isolation Panel [NHANES]    Frequency of Communication with Friends and Family: Once a week    Frequency of  Social Gatherings with Friends and Family: More than three times a week    Attends Religious Services: Never    Database administrator or Organizations: No    Attends Engineer, structural: Not on file    Marital Status: Married  Catering manager Violence: Not on file    Past Surgical History:  Procedure Laterality Date   EXCISION MASS HEAD  03/17/2018   excision cells on head  ; still in testing for cancer per patient report    LUMBAR EPIDURAL INJECTION     SHOULDER ARTHROSCOPY Left 2021   UMBILICAL HERNIA REPAIR N/A 04/02/2018   Procedure: OPEN UMBILICAL HERNIA REPAIR WITH INSERTION OF MESH;  Surgeon: Andria Meuse, MD;  Location: [facility];  Service: General;  Laterality: N/A;   WISDOM TOOTH EXTRACTION  1989-1990    Family History  Problem Relation Age of Onset   Hypertension Mother    Hyperlipidemia Father    Healthy Sister    Hypertension Maternal Uncle    Hyperlipidemia Maternal Uncle    Hepatitis Paternal Aunt    Hepatitis C Paternal Aunt    Hypertension Paternal Uncle    Hyperlipidemia Paternal Uncle    Colon cancer Maternal Grandfather    Prostate cancer Maternal Grandfather    Congestive Heart Failure Maternal Grandfather    Dementia Paternal Grandmother    Heart disease Paternal Grandfather    Heart attack Paternal Grandfather    Healthy Daughter        x4   Healthy Son        x1   Esophageal cancer Neg Hx    Stomach cancer Neg Hx    Rectal cancer Neg Hx     Allergies  Allergen Reactions   Co-Trimoxazole [Sulfamethoxazole-Trimethoprim] Nausea And Vomiting   Lisinopril Cough    With Wheezing    Sulfa Antibiotics Nausea And Vomiting    Current Outpatient Medications on File Prior to Visit  Medication Sig Dispense Refill   acetaminophen (TYLENOL) 500 MG tablet Take 1,000 mg by mouth daily as needed for moderate pain or headache.     albuterol (PROVENTIL HFA;VENTOLIN HFA) 108 (90 Base) MCG/ACT inhaler Inhale 1-2 puffs into the lungs every 6  (six) hours as needed for wheezing or shortness of breath. Ventolin     amLODipine (NORVASC) 5 MG tablet Take 1 tablet (5 mg total) by mouth daily. 90 tablet 0   atorvastatin (LIPITOR) 80 MG tablet TAKE 0.5 TABLETS BY MOUTH DAILY. 45 tablet 1   buPROPion (WELLBUTRIN XL) 150 MG 24 hr tablet Take 150 mg by mouth every morning.     calcium carbonate (TUMS - DOSED IN MG ELEMENTAL CALCIUM) 500 MG chewable tablet Chew 2 tablets by mouth  daily as needed for indigestion or heartburn.     fenofibrate micronized (LOFIBRA) 67 MG capsule TAKE 1 CAPSULE (67 MG TOTAL) BY MOUTH DAILY BEFORE BREAKFAST. 90 capsule 3   FLUoxetine (PROZAC) 40 MG capsule Take 2 capsules (80 mg total) by mouth daily. 180 capsule 1   fluticasone (FLONASE) 50 MCG/ACT nasal spray Place 2 sprays into both nostrils daily. 48 mL 1   gabapentin (NEURONTIN) 300 MG capsule Take 300 mg by mouth 3 (three) times daily.     ibuprofen (ADVIL,MOTRIN) 200 MG tablet Take 400 mg by mouth daily as needed for headache or moderate pain.     ipratropium (ATROVENT) 0.03 % nasal spray PLACE 2 SPRAYS INTO BOTH NOSTRILS EVERY 12 (TWELVE) HOURS. 90 mL 1   loratadine (CLARITIN) 10 MG tablet Take 10 mg by mouth daily as needed for allergies.     LORazepam (ATIVAN) 0.5 MG tablet Take 1 tablet (0.5 mg total) by mouth 2 (two) times daily as needed for anxiety. 60 tablet 1   Multiple Vitamin (MULTIVITAMIN WITH MINERALS) TABS tablet Take 1 tablet by mouth daily.     propranolol (INDERAL) 20 MG tablet TAKE 1 TABLET BY MOUTH TWICE A DAY 180 tablet 1   QUEtiapine (SEROQUEL) 50 MG tablet Take 2 tablets (100 mg total) by mouth at bedtime. 180 tablet 1   SUMAtriptan (IMITREX) 50 MG tablet 1 TABLET DAILY , MAY REPEAT IN 2 HOURS IF HEADACHE PERSISTS OR RECURS. 9 tablet 2   triamterene-hydrochlorothiazide (MAXZIDE-25) 37.5-25 MG tablet TAKE 1 TABLET BY MOUTH EVERY DAY 90 tablet 1   WIXELA INHUB 500-50 MCG/ACT AEPB INHALE 2 PUFFS INTO THE LUNGS IN THE MORNING AND AT BEDTIME. 60  each 5   zolpidem (AMBIEN) 10 MG tablet Take 1 tablet (10 mg total) by mouth at bedtime as needed for sleep. 30 tablet 1   No current facility-administered medications on file prior to visit.    BP 120/86   Pulse 78   Temp 98.2 F (36.8 C)   Resp 18   Ht 5\' 10"  (1.778 m)   Wt 194 lb (88 kg)   SpO2 98%   BMI 27.84 kg/m     Covid test was negative.     Objective:   Physical Exam  General- No acute distress. Pleasant patient. Neck- Full range of motion, no jvd Lungs- Clear, even and unlabored. Heart- regular rate and rhythm. Neurologic- CNII- XII grossly intact.  Heent- frontal and maxillary pressure. More frontal. Postrior pharynx- no hypertrophy but pnd.     Assessment & Plan:   Assessment and Plan    Sinusitis Frontal sinus tenderness and congestion, non-productive cough, and sore throat for 4 days. No fever, chills, or sweats. History of sinus infections and bronchitis. -Continue Flonase and Atrovent nasal sprays. -Prescribe Doxycycline for sinusitis.  Bronchitis Non-productive cough and chest congestion. No wheezing or difficulty breathing. History of bronchitis. -Continue Wixela inhaler BID. -Prescribe Benzonatate for cough. -Use Albuterol as needed for breakthrough wheezing. -If repetitive wheezing occurs (Albuterol use every 6 hours), contact office for possible Prednisone taper.      Follow up 7-10 days or sooner if needed

## 2023-07-11 MED ORDER — PREDNISONE 10 MG (21) PO TBPK: ORAL_TABLET | ORAL | 0 refills | Status: DC

## 2023-07-11 NOTE — Addendum Note (Signed)
Addended by: Gwenevere Abbot on: 07/11/2023 08:42 AM   Modules accepted: Orders

## 2023-07-16 ENCOUNTER — Encounter: Payer: Self-pay | Admitting: Medical

## 2023-07-16 NOTE — Addendum Note (Signed)
Addended by: Gwenevere Abbot on: 07/16/2023 11:49 AM   Modules accepted: Orders

## 2023-07-17 ENCOUNTER — Ambulatory Visit (HOSPITAL_BASED_OUTPATIENT_CLINIC_OR_DEPARTMENT_OTHER)
Admission: RE | Admit: 2023-07-17 | Discharge: 2023-07-17 | Disposition: A | Discharge status: Home / Self Care | Payer: BC Managed Care – PPO | Source: Ambulatory Visit | Attending: Medical | Admitting: Medical

## 2023-07-17 ENCOUNTER — Ambulatory Visit: Payer: BC Managed Care – PPO | Admitting: Medical

## 2023-07-17 VITALS — BP 114/86 | HR 78 | Temp 98.5°F | Resp 18 | Ht 70.0 in | Wt 194.4 lb

## 2023-07-17 DIAGNOSIS — R0600 Dyspnea, unspecified: Secondary | ICD-10-CM | POA: Diagnosis not present

## 2023-07-17 DIAGNOSIS — R5383 Other fatigue: Secondary | ICD-10-CM

## 2023-07-17 DIAGNOSIS — R062 Wheezing: Secondary | ICD-10-CM | POA: Diagnosis present

## 2023-07-17 DIAGNOSIS — R059 Cough, unspecified: Secondary | ICD-10-CM | POA: Insufficient documentation

## 2023-07-17 LAB — COMPREHENSIVE METABOLIC PANEL
ALT: 29 U/L (ref 0–53)
AST: 17 U/L (ref 0–37)
Albumin: 4 g/dL (ref 3.5–5.2)
Alkaline Phosphatase: 53 U/L (ref 39–117)
BUN: 26 mg/dL — ABNORMAL HIGH (ref 6–23)
CO2: 31 meq/L (ref 19–32)
Calcium: 9 mg/dL (ref 8.4–10.5)
Chloride: 102 meq/L (ref 96–112)
Creatinine, Ser: 0.99 mg/dL (ref 0.40–1.50)
GFR: 87.26 mL/min (ref >60.00)
Glucose, Bld: 102 mg/dL — ABNORMAL HIGH (ref 70–99)
Potassium: 3.6 meq/L (ref 3.5–5.1)
Sodium: 138 meq/L (ref 135–145)
Total Bilirubin: 0.5 mg/dL (ref 0.2–1.2)
Total Protein: 6.3 g/dL (ref 6.0–8.3)

## 2023-07-17 LAB — CBC WITH DIFFERENTIAL/PLATELET
Basophils Absolute: 0 10*3/uL (ref 0.0–0.1)
Basophils Relative: 0.2 % (ref 0.0–3.0)
Eosinophils Absolute: 0.2 10*3/uL (ref 0.0–0.7)
Eosinophils Relative: 1.3 % (ref 0.0–5.0)
HCT: 39.8 % (ref 39.0–52.0)
Hemoglobin: 12.9 g/dL — ABNORMAL LOW (ref 13.0–17.0)
Lymphocytes Relative: 27.7 % (ref 12.0–46.0)
Lymphs Abs: 3.5 10*3/uL (ref 0.7–4.0)
MCHC: 32.4 g/dL (ref 30.0–36.0)
MCV: 84.9 fL (ref 78.0–100.0)
Monocytes Absolute: 1.1 10*3/uL — ABNORMAL HIGH (ref 0.1–1.0)
Monocytes Relative: 8.8 % (ref 3.0–12.0)
Neutro Abs: 7.9 10*3/uL — ABNORMAL HIGH (ref 1.4–7.7)
Neutrophils Relative %: 62 % (ref 43.0–77.0)
Platelets: 384 10*3/uL (ref 150.0–400.0)
RBC: 4.69 Mil/uL (ref 4.22–5.81)
RDW: 14 % (ref 11.5–15.5)
WBC: 12.7 10*3/uL — ABNORMAL HIGH (ref 4.0–10.5)

## 2023-07-17 MED ORDER — CEFTRIAXONE SODIUM 500 MG IJ SOLR
500.0000 mg | Freq: Once | INTRAMUSCULAR | Status: AC
  Administered 2023-07-17: 500 mg via INTRAMUSCULAR

## 2023-07-17 MED ORDER — METHYLPREDNISOLONE ACETATE 80 MG/ML IJ SUSP
80.0000 mg | Freq: Once | INTRAMUSCULAR | Status: AC
  Administered 2023-07-17: 80 mg via INTRAMUSCULAR

## 2023-07-17 NOTE — Progress Notes (Signed)
Subjective:    Patient ID: KENDEL BESSEY, male    DOB: May 08, 1971, 52 y.o.   MRN: [id number]  HPI Pt in for follow up. Last visit A/P  "Sinusitis Frontal sinus tenderness and congestion, non-productive cough, and sore throat for 4 days. No fever, chills, or sweats. History of sinus infections and bronchitis. -Continue Flonase and Atrovent nasal sprays. -Prescribe Doxycycline for sinusitis.   Bronchitis Non-productive cough and chest congestion. No wheezing or difficulty breathing. History of bronchitis. -Continue Wixela inhaler BID. -Prescribe Benzonatate for cough. -Use Albuterol as needed for breakthrough wheezing. -If repetitive wheezing occurs (Albuterol use every 6 hours), contact office for possible Prednisone taper."  Below AI  Discussed the use of AI scribe software for clinical note transcription with the patient, who gave verbal consent to proceed.  History of Present Illness   The patient, currently on a course of doxycycline with three days remaining, reports persistent symptoms despite treatment for sinusitis and bronchitis. He completed a course of prednisone and has been using a Wixela inhaler and benzonatate for symptom management. Over-the-counter daytime and nighttime medications were added to manage coughing and congestion, particularly disruptive during sleep.  The patient describes ongoing sinus pressure, particularly in the maxillary area, and continues to cough up small amounts of slightly colored mucus. The severity of symptoms varies day-to-day, with fatigue and a sense of being 'worn out' more pronounced towards the end of the day. He reports a sensation of incomplete or weak breathing, particularly noticeable during exertion, but denies any swelling in the calves or pain behind the knees.  The patient has been monitoring his oxygen levels at home, noting a drop to 93% during a recent bout of intense coughing. Despite the majority of the treatment course  being completed, the patient reports minimal improvement in symptoms. 02 sat on exam 96%.        Past Medical History:  Diagnosis Date   Alcohol abuse    Remission >20 years ago   Allergy    Anxiety    Asthma    Cancer (HCC)    Depression    MAJOR DEPRESSIVE DISORDER   Depression with anxiety    Drug abuse (HCC)    Remission >20 years ago   Environmental allergies    History of chicken pox    Hyperlipidemia    Hypertension    Hypoalphalipoproteinemia, familial    Lumbar pain    Migraines    Primary generalized (osteo)arthritis    Seasonal allergies      Social History   Socioeconomic History   Marital status: Married    Spouse name: Not on file   Number of children: Not on file   Years of education: Not on file   Highest education level: Master's degree (e.g., MA, MS, MEng, MEd, MSW, MBA)  Occupational History   Not on file  Tobacco Use   Smoking status: Former    Current packs/day: 0.00    Types: Cigarettes    Quit date: 03/07/1988    Years since quitting: 35.3   Smokeless tobacco: Former  Building services engineer status: Never Used  Substance and Sexual Activity   Alcohol use: Not Currently    Comment: 1/month   Drug use: Not Currently   Sexual activity: Yes  Other Topics Concern   Not on file  Social History Narrative   Not on file   Social Determinants of Health   Financial Resource Strain: Low Risk  (03/13/2023)   Overall Financial  Resource Strain (CARDIA)    Difficulty of Paying Living Expenses: Not hard at all  Food Insecurity: No Food Insecurity (03/13/2023)   Hunger Vital Sign    Worried About Running Out of Food in the Last Year: Never true    Ran Out of Food in the Last Year: Never true  Transportation Needs: No Transportation Needs (03/13/2023)   PRAPARE - Administrator, Civil Service (Medical): No    Lack of Transportation (Non-Medical): No  Physical Activity: Sufficiently Active (03/13/2023)   Exercise Vital Sign    Days of Exercise  per Week: 5 days    Minutes of Exercise per Session: 60 min  Stress: Stress Concern Present (03/13/2023)   [employer] of Occupational Health - Occupational Stress Questionnaire    Feeling of Stress : To some extent  Social Connections: Moderately Isolated (03/13/2023)   Social Connection and Isolation Panel [NHANES]    Frequency of Communication with Friends and Family: Once a week    Frequency of Social Gatherings with Friends and Family: More than three times a week    Attends Religious Services: Never    Database administrator or Organizations: No    Attends Engineer, structural: Not on file    Marital Status: Married  Catering manager Violence: Not on file    Past Surgical History:  Procedure Laterality Date   EXCISION MASS HEAD  03/17/2018   excision cells on head  ; still in testing for cancer per patient report    LUMBAR EPIDURAL INJECTION     SHOULDER ARTHROSCOPY Left 2021   UMBILICAL HERNIA REPAIR N/A 04/02/2018   Procedure: OPEN UMBILICAL HERNIA REPAIR WITH INSERTION OF MESH;  Surgeon: Andria Meuse, MD;  Location: [facility];  Service: General;  Laterality: N/A;   WISDOM TOOTH EXTRACTION  1989-1990    Family History  Problem Relation Age of Onset   Hypertension Mother    Hyperlipidemia Father    Healthy Sister    Hypertension Maternal Uncle    Hyperlipidemia Maternal Uncle    Hepatitis Paternal Aunt    Hepatitis C Paternal Aunt    Hypertension Paternal Uncle    Hyperlipidemia Paternal Uncle    Colon cancer Maternal Grandfather    Prostate cancer Maternal Grandfather    Congestive Heart Failure Maternal Grandfather    Dementia Paternal Grandmother    Heart disease Paternal Grandfather    Heart attack Paternal Grandfather    Healthy Daughter        x4   Healthy Son        x1   Esophageal cancer Neg Hx    Stomach cancer Neg Hx    Rectal cancer Neg Hx     Allergies  Allergen Reactions   Co-Trimoxazole [Sulfamethoxazole-Trimethoprim] Nausea  And Vomiting   Lisinopril Cough    With Wheezing    Sulfa Antibiotics Nausea And Vomiting    Current Outpatient Medications on File Prior to Visit  Medication Sig Dispense Refill   acetaminophen (TYLENOL) 500 MG tablet Take 1,000 mg by mouth daily as needed for moderate pain or headache.     albuterol (PROVENTIL HFA;VENTOLIN HFA) 108 (90 Base) MCG/ACT inhaler Inhale 1-2 puffs into the lungs every 6 (six) hours as needed for wheezing or shortness of breath. Ventolin     amLODipine (NORVASC) 5 MG tablet Take 1 tablet (5 mg total) by mouth daily. 90 tablet 0   atorvastatin (LIPITOR) 80 MG tablet TAKE 0.5 TABLETS BY MOUTH DAILY. 45  tablet 1   benzonatate (TESSALON) 100 MG capsule Take 1 capsule (100 mg total) by mouth 3 (three) times daily as needed for cough. 30 capsule 0   buPROPion (WELLBUTRIN XL) 150 MG 24 hr tablet Take 150 mg by mouth every morning.     calcium carbonate (TUMS - DOSED IN MG ELEMENTAL CALCIUM) 500 MG chewable tablet Chew 2 tablets by mouth daily as needed for indigestion or heartburn.     doxycycline (VIBRA-TABS) 100 MG tablet Take 1 tablet (100 mg total) by mouth 2 (two) times daily. 20 tablet 0   fenofibrate micronized (LOFIBRA) 67 MG capsule TAKE 1 CAPSULE (67 MG TOTAL) BY MOUTH DAILY BEFORE BREAKFAST. 90 capsule 3   fluticasone (FLONASE) 50 MCG/ACT nasal spray Place 2 sprays into both nostrils daily. 48 mL 1   gabapentin (NEURONTIN) 300 MG capsule Take 300 mg by mouth 3 (three) times daily.     ibuprofen (ADVIL,MOTRIN) 200 MG tablet Take 400 mg by mouth daily as needed for headache or moderate pain.     ipratropium (ATROVENT) 0.03 % nasal spray PLACE 2 SPRAYS INTO BOTH NOSTRILS EVERY 12 (TWELVE) HOURS. 90 mL 1   loratadine (CLARITIN) 10 MG tablet Take 10 mg by mouth daily as needed for allergies.     LORazepam (ATIVAN) 0.5 MG tablet Take 1 tablet (0.5 mg total) by mouth 2 (two) times daily as needed for anxiety. 60 tablet 1   Multiple Vitamin (MULTIVITAMIN WITH MINERALS)  TABS tablet Take 1 tablet by mouth daily.     predniSONE (STERAPRED UNI-PAK 21 TAB) 10 MG (21) TBPK tablet Taper over 6 days. 21 tablet 0   propranolol (INDERAL) 20 MG tablet TAKE 1 TABLET BY MOUTH TWICE A DAY 180 tablet 1   SUMAtriptan (IMITREX) 50 MG tablet 1 TABLET DAILY , MAY REPEAT IN 2 HOURS IF HEADACHE PERSISTS OR RECURS. 9 tablet 2   triamterene-hydrochlorothiazide (MAXZIDE-25) 37.5-25 MG tablet TAKE 1 TABLET BY MOUTH EVERY DAY 90 tablet 1   WIXELA INHUB 500-50 MCG/ACT AEPB INHALE 2 PUFFS INTO THE LUNGS IN THE MORNING AND AT BEDTIME. 60 each 5   FLUoxetine (PROZAC) 40 MG capsule Take 2 capsules (80 mg total) by mouth daily. 180 capsule 1   QUEtiapine (SEROQUEL) 50 MG tablet Take 2 tablets (100 mg total) by mouth at bedtime. 180 tablet 1   zolpidem (AMBIEN) 10 MG tablet Take 1 tablet (10 mg total) by mouth at bedtime as needed for sleep. 30 tablet 1   No current facility-administered medications on file prior to visit.    BP 114/86 (BP Location: Left Arm, Patient Position: Sitting, Cuff Size: Large)   Pulse 78   Temp 98.5 F (36.9 C) (Oral)   Resp 18   Ht 5\' 10"  (1.778 m)   Wt 194 lb 6.4 oz (88.2 kg)   SpO2 96%   BMI 27.89 kg/m     Review of Systems  Constitutional:  Negative for chills, fatigue and fever.  HENT:  Positive for congestion and sinus pressure. Negative for sneezing.   Respiratory:  Positive for cough and wheezing. Negative for chest tightness.   Cardiovascular:  Negative for chest pain and palpitations.  Gastrointestinal:  Negative for abdominal pain.  Genitourinary:  Negative for dysuria.  Musculoskeletal:  Negative for back pain and joint swelling.  Neurological:  Negative for dizziness, speech difficulty, weakness and light-headedness.  Hematological:  Negative for adenopathy. Does not bruise/bleed easily.  Psychiatric/Behavioral:  Negative for behavioral problems and decreased concentration.  Past Medical History:  Diagnosis Date   Alcohol abuse     Remission >20 years ago   Allergy    Anxiety    Asthma    Cancer (HCC)    Depression    MAJOR DEPRESSIVE DISORDER   Depression with anxiety    Drug abuse (HCC)    Remission >20 years ago   Environmental allergies    History of chicken pox    Hyperlipidemia    Hypertension    Hypoalphalipoproteinemia, familial    Lumbar pain    Migraines    Primary generalized (osteo)arthritis    Seasonal allergies      Social History   Socioeconomic History   Marital status: Married    Spouse name: Not on file   Number of children: Not on file   Years of education: Not on file   Highest education level: Master's degree (e.g., MA, MS, MEng, MEd, MSW, MBA)  Occupational History   Not on file  Tobacco Use   Smoking status: Former    Current packs/day: 0.00    Types: Cigarettes    Quit date: 03/07/1988    Years since quitting: 35.3   Smokeless tobacco: Former  Building services engineer status: Never Used  Substance and Sexual Activity   Alcohol use: Not Currently    Comment: 1/month   Drug use: Not Currently   Sexual activity: Yes  Other Topics Concern   Not on file  Social History Narrative   Not on file   Social Determinants of Health   Financial Resource Strain: Low Risk  (03/13/2023)   Overall Financial Resource Strain (CARDIA)    Difficulty of Paying Living Expenses: Not hard at all  Food Insecurity: No Food Insecurity (03/13/2023)   Hunger Vital Sign    Worried About Running Out of Food in the Last Year: Never true    Ran Out of Food in the Last Year: Never true  Transportation Needs: No Transportation Needs (03/13/2023)   PRAPARE - Administrator, Civil Service (Medical): No    Lack of Transportation (Non-Medical): No  Physical Activity: Sufficiently Active (03/13/2023)   Exercise Vital Sign    Days of Exercise per Week: 5 days    Minutes of Exercise per Session: 60 min  Stress: Stress Concern Present (03/13/2023)   [employer] of Occupational Health -  Occupational Stress Questionnaire    Feeling of Stress : To some extent  Social Connections: Moderately Isolated (03/13/2023)   Social Connection and Isolation Panel [NHANES]    Frequency of Communication with Friends and Family: Once a week    Frequency of Social Gatherings with Friends and Family: More than three times a week    Attends Religious Services: Never    Database administrator or Organizations: No    Attends Engineer, structural: Not on file    Marital Status: Married  Catering manager Violence: Not on file    Past Surgical History:  Procedure Laterality Date   EXCISION MASS HEAD  03/17/2018   excision cells on head  ; still in testing for cancer per patient report    LUMBAR EPIDURAL INJECTION     SHOULDER ARTHROSCOPY Left 2021   UMBILICAL HERNIA REPAIR N/A 04/02/2018   Procedure: OPEN UMBILICAL HERNIA REPAIR WITH INSERTION OF MESH;  Surgeon: Andria Meuse, MD;  Location: [facility];  Service: General;  Laterality: N/A;   WISDOM TOOTH EXTRACTION  1989-1990    Family History  Problem  Relation Age of Onset   Hypertension Mother    Hyperlipidemia Father    Healthy Sister    Hypertension Maternal Uncle    Hyperlipidemia Maternal Uncle    Hepatitis Paternal Aunt    Hepatitis C Paternal Aunt    Hypertension Paternal Uncle    Hyperlipidemia Paternal Uncle    Colon cancer Maternal Grandfather    Prostate cancer Maternal Grandfather    Congestive Heart Failure Maternal Grandfather    Dementia Paternal Grandmother    Heart disease Paternal Grandfather    Heart attack Paternal Grandfather    Healthy Daughter        x4   Healthy Son        x1   Esophageal cancer Neg Hx    Stomach cancer Neg Hx    Rectal cancer Neg Hx     Allergies  Allergen Reactions   Co-Trimoxazole [Sulfamethoxazole-Trimethoprim] Nausea And Vomiting   Lisinopril Cough    With Wheezing    Sulfa Antibiotics Nausea And Vomiting    Current Outpatient Medications on File Prior to  Visit  Medication Sig Dispense Refill   acetaminophen (TYLENOL) 500 MG tablet Take 1,000 mg by mouth daily as needed for moderate pain or headache.     albuterol (PROVENTIL HFA;VENTOLIN HFA) 108 (90 Base) MCG/ACT inhaler Inhale 1-2 puffs into the lungs every 6 (six) hours as needed for wheezing or shortness of breath. Ventolin     amLODipine (NORVASC) 5 MG tablet Take 1 tablet (5 mg total) by mouth daily. 90 tablet 0   atorvastatin (LIPITOR) 80 MG tablet TAKE 0.5 TABLETS BY MOUTH DAILY. 45 tablet 1   benzonatate (TESSALON) 100 MG capsule Take 1 capsule (100 mg total) by mouth 3 (three) times daily as needed for cough. 30 capsule 0   buPROPion (WELLBUTRIN XL) 150 MG 24 hr tablet Take 150 mg by mouth every morning.     calcium carbonate (TUMS - DOSED IN MG ELEMENTAL CALCIUM) 500 MG chewable tablet Chew 2 tablets by mouth daily as needed for indigestion or heartburn.     doxycycline (VIBRA-TABS) 100 MG tablet Take 1 tablet (100 mg total) by mouth 2 (two) times daily. 20 tablet 0   fenofibrate micronized (LOFIBRA) 67 MG capsule TAKE 1 CAPSULE (67 MG TOTAL) BY MOUTH DAILY BEFORE BREAKFAST. 90 capsule 3   fluticasone (FLONASE) 50 MCG/ACT nasal spray Place 2 sprays into both nostrils daily. 48 mL 1   gabapentin (NEURONTIN) 300 MG capsule Take 300 mg by mouth 3 (three) times daily.     ibuprofen (ADVIL,MOTRIN) 200 MG tablet Take 400 mg by mouth daily as needed for headache or moderate pain.     ipratropium (ATROVENT) 0.03 % nasal spray PLACE 2 SPRAYS INTO BOTH NOSTRILS EVERY 12 (TWELVE) HOURS. 90 mL 1   loratadine (CLARITIN) 10 MG tablet Take 10 mg by mouth daily as needed for allergies.     LORazepam (ATIVAN) 0.5 MG tablet Take 1 tablet (0.5 mg total) by mouth 2 (two) times daily as needed for anxiety. 60 tablet 1   Multiple Vitamin (MULTIVITAMIN WITH MINERALS) TABS tablet Take 1 tablet by mouth daily.     predniSONE (STERAPRED UNI-PAK 21 TAB) 10 MG (21) TBPK tablet Taper over 6 days. 21 tablet 0    propranolol (INDERAL) 20 MG tablet TAKE 1 TABLET BY MOUTH TWICE A DAY 180 tablet 1   SUMAtriptan (IMITREX) 50 MG tablet 1 TABLET DAILY , MAY REPEAT IN 2 HOURS IF HEADACHE PERSISTS OR RECURS. 9 tablet  2   triamterene-hydrochlorothiazide (MAXZIDE-25) 37.5-25 MG tablet TAKE 1 TABLET BY MOUTH EVERY DAY 90 tablet 1   WIXELA INHUB 500-50 MCG/ACT AEPB INHALE 2 PUFFS INTO THE LUNGS IN THE MORNING AND AT BEDTIME. 60 each 5   FLUoxetine (PROZAC) 40 MG capsule Take 2 capsules (80 mg total) by mouth daily. 180 capsule 1   QUEtiapine (SEROQUEL) 50 MG tablet Take 2 tablets (100 mg total) by mouth at bedtime. 180 tablet 1   zolpidem (AMBIEN) 10 MG tablet Take 1 tablet (10 mg total) by mouth at bedtime as needed for sleep. 30 tablet 1   No current facility-administered medications on file prior to visit.    BP 114/86 (BP Location: Left Arm, Patient Position: Sitting, Cuff Size: Large)   Pulse 78   Temp 98.5 F (36.9 C) (Oral)   Resp 18   Ht 5\' 10"  (1.778 m)   Wt 194 lb 6.4 oz (88.2 kg)   SpO2 96%   BMI 27.89 kg/m        Objective:   Physical Exam  General Mental Status- Alert. General Appearance- Not in acute distress.   Skin General: Color- Normal Color. Moisture- Normal Moisture.  Neck Carotid Arteries- Normal color. Moisture- Normal Moisture. No carotid bruits. No JVD.  Chest and Lung Exam Auscultation: Breath Sounds:-even unlabored. But some expiratory wheezing.  Cardiovascular Auscultation:Rythm- Regular. Murmurs & Other Heart Sounds:Auscultation of the heart reveals- No Murmurs.  Abdomen Inspection:-Inspeection Normal. Palpation/Percussion:Note:No mass. Palpation and Percussion of the abdomen reveal- Non Tender, Non Distended + BS, no rebound or guarding.   Neurologic Cranial Nerve exam:- CN III-XII intact(No nystagmus), symmetric smile. Strength:- 5/5 equal and symmetric strength both upper and lower extremities.   Heent- mild maxillary sinus pressure. Boggy  turbinates.Nasal congested.      Assessment & Plan:   Assessment and Plan    Sinusitis and Bronchitis Persistent symptoms despite treatment with doxycycline, prednisone, Wixela inhaler, and benzonatate. Experiencing sinus pressure, cough with mucus production, and fatigue. No leg swelling or pain behind knees. Oxygen saturation 96% at rest, but reported lower levels during coughing episodes. -Administer Rocephin 500mg  injection today. -Administer Depo-Medrol injection today. -Continue doxycycline for 3 more days. -Order CBC and metabolic panel to assess infection fighting cells and electrolytes. -Order chest x-ray. -Review results of blood work and chest x-ray to determine if further modification of treatment is needed (additional steroids or change in antibiotics).   Follow up date to be determined based on lab and chest xray        Esperanza Richters, PA-C

## 2023-07-17 NOTE — Addendum Note (Signed)
Addended by: Marian Sorrow D on: 07/17/2023 03:13 PM   Modules accepted: Orders

## 2023-07-17 NOTE — Patient Instructions (Addendum)
Sinusitis and Bronchitis Persistent symptoms despite treatment with doxycycline, prednisone, Wixela inhaler, and benzonatate. Experiencing sinus pressure, cough with mucus production, and fatigue. No leg swelling or pain behind knees. Oxygen saturation 96% at rest, but reported lower levels during coughing episodes. -Administer Rocephin 500mg  injection today. -Administer Depo-Medrol injection today. -Continue doxycycline for 3 more days. -Order CBC and metabolic panel to assess infection fighting cells and electrolytes. -Order chest x-ray. -Review results of blood work and chest x-ray to determine if further modification of treatment is needed (additional steroids or change in antibiotics).           Follow up date to be determined based on lab and chest xray

## 2023-07-18 MED ORDER — METHYLPREDNISOLONE 4 MG PO TABS: ORAL_TABLET | ORAL | 0 refills | Status: DC

## 2023-07-18 NOTE — Addendum Note (Signed)
Addended by: Gwenevere Abbot on: 07/18/2023 11:51 AM   Modules accepted: Orders

## 2023-07-18 NOTE — Addendum Note (Signed)
Addended by: Gwenevere Abbot on: 07/18/2023 12:02 PM   Modules accepted: Orders

## 2023-07-19 ENCOUNTER — Ambulatory Visit: Payer: BC Managed Care – PPO | Admitting: Medical

## 2023-07-19 VITALS — BP 124/88 | HR 78 | Temp 98.0°F | Resp 18 | Ht 70.0 in | Wt 191.0 lb

## 2023-07-19 DIAGNOSIS — R0600 Dyspnea, unspecified: Secondary | ICD-10-CM | POA: Diagnosis not present

## 2023-07-19 DIAGNOSIS — J40 Bronchitis, not specified as acute or chronic: Secondary | ICD-10-CM

## 2023-07-19 DIAGNOSIS — M659 Unspecified synovitis and tenosynovitis, unspecified site: Secondary | ICD-10-CM

## 2023-07-19 DIAGNOSIS — R059 Cough, unspecified: Secondary | ICD-10-CM | POA: Diagnosis not present

## 2023-07-19 DIAGNOSIS — J45901 Unspecified asthma with (acute) exacerbation: Secondary | ICD-10-CM

## 2023-07-19 DIAGNOSIS — R062 Wheezing: Secondary | ICD-10-CM

## 2023-07-19 LAB — D-DIMER, QUANTITATIVE: D-Dimer, Quant: 0.38 ug{FEU}/mL (ref <0.50)

## 2023-07-19 NOTE — Progress Notes (Signed)
Subjective:    Patient ID: George Ward, male    DOB: 1971/07/23, 52 y.o.   MRN: [id number]  HPI  Discussed the use of AI scribe software for clinical note transcription with the patient, who gave verbal consent to proceed.  History of Present Illness   The patient, with a history of bronchitis and lung symptoms, presents with persistent right wrist pain following a recent episode of heavy lifting/cleaning garage. The pain is localized at the base of the thumb and radiates upwards, with a sensation of a 'tender ligament. The pain is exacerbated by rotation and certain movements and has not improved despite the use of a wrist cock up splint and prednisone(used for recent bronchitis more than a week ago).  In addition to the wrist pain, the patient reports feeling 'weak' and unable to take a full breath, which he attributes to persistent coughing. He experienced an episode of lightheadedness, sweating, and tingling in the extremities after a period of physical exertion and inadequate food intake. This episode lasted several minutes, but did not include palpitations or chest pain. The patient's oxygen saturation at the time was 95%, and he felt some improvement after a breathing treatment when he got home.  The patient has been using albuterol for his lung symptoms, and reports noticeable improvement after each treatment. Despite this, he continues to experience shortness of breath and wheezing.       Review of Systems  Constitutional:  Negative for chills and fatigue.  HENT:  Negative for congestion, ear pain, sinus pressure and sore throat.   Respiratory:  Positive for shortness of breath. Negative for cough, chest tightness and wheezing.   Cardiovascular:  Negative for chest pain and palpitations.  Gastrointestinal:  Negative for abdominal pain and nausea.  Genitourinary:  Negative for dysuria.  Musculoskeletal:  Negative for back pain, myalgias and neck pain.  Skin:  Negative for rash.   Neurological:  Negative for dizziness, speech difficulty, weakness and light-headedness.  Hematological:  Negative for adenopathy. Does not bruise/bleed easily.  Psychiatric/Behavioral:  Negative for behavioral problems and dysphoric mood.     Past Medical History:  Diagnosis Date   Alcohol abuse    Remission >20 years ago   Allergy    Anxiety    Asthma    Cancer (HCC)    Depression    MAJOR DEPRESSIVE DISORDER   Depression with anxiety    Drug abuse (HCC)    Remission >20 years ago   Environmental allergies    History of chicken pox    Hyperlipidemia    Hypertension    Hypoalphalipoproteinemia, familial    Lumbar pain    Migraines    Primary generalized (osteo)arthritis    Seasonal allergies      Social History   Socioeconomic History   Marital status: Married    Spouse name: Not on file   Number of children: Not on file   Years of education: Not on file   Highest education level: Master's degree (e.g., MA, MS, MEng, MEd, MSW, MBA)  Occupational History   Not on file  Tobacco Use   Smoking status: Former    Current packs/day: 0.00    Types: Cigarettes    Quit date: 03/07/1988    Years since quitting: 35.3   Smokeless tobacco: Former  Building services engineer status: Never Used  Substance and Sexual Activity   Alcohol use: Not Currently    Comment: 1/month   Drug use: Not Currently  Sexual activity: Yes  Other Topics Concern   Not on file  Social History Narrative   Not on file   Social Determinants of Health   Financial Resource Strain: Low Risk  (03/13/2023)   Overall Financial Resource Strain (CARDIA)    Difficulty of Paying Living Expenses: Not hard at all  Food Insecurity: No Food Insecurity (03/13/2023)   Hunger Vital Sign    Worried About Running Out of Food in the Last Year: Never true    Ran Out of Food in the Last Year: Never true  Transportation Needs: No Transportation Needs (03/13/2023)   PRAPARE - Administrator, Civil Service  (Medical): No    Lack of Transportation (Non-Medical): No  Physical Activity: Sufficiently Active (03/13/2023)   Exercise Vital Sign    Days of Exercise per Week: 5 days    Minutes of Exercise per Session: 60 min  Stress: Stress Concern Present (03/13/2023)   [employer] of Occupational Health - Occupational Stress Questionnaire    Feeling of Stress : To some extent  Social Connections: Moderately Isolated (03/13/2023)   Social Connection and Isolation Panel [NHANES]    Frequency of Communication with Friends and Family: Once a week    Frequency of Social Gatherings with Friends and Family: More than three times a week    Attends Religious Services: Never    Database administrator or Organizations: No    Attends Engineer, structural: Not on file    Marital Status: Married  Catering manager Violence: Not on file    Past Surgical History:  Procedure Laterality Date   EXCISION MASS HEAD  03/17/2018   excision cells on head  ; still in testing for cancer per patient report    LUMBAR EPIDURAL INJECTION     SHOULDER ARTHROSCOPY Left 2021   UMBILICAL HERNIA REPAIR N/A 04/02/2018   Procedure: OPEN UMBILICAL HERNIA REPAIR WITH INSERTION OF MESH;  Surgeon: Andria Meuse, MD;  Location: [facility];  Service: General;  Laterality: N/A;   WISDOM TOOTH EXTRACTION  1989-1990    Family History  Problem Relation Age of Onset   Hypertension Mother    Hyperlipidemia Father    Healthy Sister    Hypertension Maternal Uncle    Hyperlipidemia Maternal Uncle    Hepatitis Paternal Aunt    Hepatitis C Paternal Aunt    Hypertension Paternal Uncle    Hyperlipidemia Paternal Uncle    Colon cancer Maternal Grandfather    Prostate cancer Maternal Grandfather    Congestive Heart Failure Maternal Grandfather    Dementia Paternal Grandmother    Heart disease Paternal Grandfather    Heart attack Paternal Grandfather    Healthy Daughter        x4   Healthy Son        x1   Esophageal  cancer Neg Hx    Stomach cancer Neg Hx    Rectal cancer Neg Hx     Allergies  Allergen Reactions   Co-Trimoxazole [Sulfamethoxazole-Trimethoprim] Nausea And Vomiting   Lisinopril Cough    With Wheezing    Sulfa Antibiotics Nausea And Vomiting    Current Outpatient Medications on File Prior to Visit  Medication Sig Dispense Refill   acetaminophen (TYLENOL) 500 MG tablet Take 1,000 mg by mouth daily as needed for moderate pain or headache.     albuterol (PROVENTIL HFA;VENTOLIN HFA) 108 (90 Base) MCG/ACT inhaler Inhale 1-2 puffs into the lungs every 6 (six) hours as needed for wheezing  or shortness of breath. Ventolin     amLODipine (NORVASC) 5 MG tablet Take 1 tablet (5 mg total) by mouth daily. 90 tablet 0   atorvastatin (LIPITOR) 80 MG tablet TAKE 0.5 TABLETS BY MOUTH DAILY. 45 tablet 1   benzonatate (TESSALON) 100 MG capsule Take 1 capsule (100 mg total) by mouth 3 (three) times daily as needed for cough. 30 capsule 0   buPROPion (WELLBUTRIN XL) 150 MG 24 hr tablet Take 150 mg by mouth every morning.     calcium carbonate (TUMS - DOSED IN MG ELEMENTAL CALCIUM) 500 MG chewable tablet Chew 2 tablets by mouth daily as needed for indigestion or heartburn.     doxycycline (VIBRA-TABS) 100 MG tablet Take 1 tablet (100 mg total) by mouth 2 (two) times daily. 20 tablet 0   fenofibrate micronized (LOFIBRA) 67 MG capsule TAKE 1 CAPSULE (67 MG TOTAL) BY MOUTH DAILY BEFORE BREAKFAST. 90 capsule 3   FLUoxetine (PROZAC) 40 MG capsule Take 2 capsules (80 mg total) by mouth daily. 180 capsule 1   fluticasone (FLONASE) 50 MCG/ACT nasal spray Place 2 sprays into both nostrils daily. 48 mL 1   gabapentin (NEURONTIN) 300 MG capsule Take 300 mg by mouth 3 (three) times daily.     ibuprofen (ADVIL,MOTRIN) 200 MG tablet Take 400 mg by mouth daily as needed for headache or moderate pain.     ipratropium (ATROVENT) 0.03 % nasal spray PLACE 2 SPRAYS INTO BOTH NOSTRILS EVERY 12 (TWELVE) HOURS. 90 mL 1    loratadine (CLARITIN) 10 MG tablet Take 10 mg by mouth daily as needed for allergies.     LORazepam (ATIVAN) 0.5 MG tablet Take 1 tablet (0.5 mg total) by mouth 2 (two) times daily as needed for anxiety. 60 tablet 1   methylPREDNISolone (MEDROL) 4 MG tablet Standard 6 day taper 21 tablet 0   Multiple Vitamin (MULTIVITAMIN WITH MINERALS) TABS tablet Take 1 tablet by mouth daily.     predniSONE (STERAPRED UNI-PAK 21 TAB) 10 MG (21) TBPK tablet Taper over 6 days. 21 tablet 0   propranolol (INDERAL) 20 MG tablet TAKE 1 TABLET BY MOUTH TWICE A DAY 180 tablet 1   QUEtiapine (SEROQUEL) 50 MG tablet Take 2 tablets (100 mg total) by mouth at bedtime. 180 tablet 1   SUMAtriptan (IMITREX) 50 MG tablet 1 TABLET DAILY , MAY REPEAT IN 2 HOURS IF HEADACHE PERSISTS OR RECURS. 9 tablet 2   triamterene-hydrochlorothiazide (MAXZIDE-25) 37.5-25 MG tablet TAKE 1 TABLET BY MOUTH EVERY DAY 90 tablet 1   WIXELA INHUB 500-50 MCG/ACT AEPB INHALE 2 PUFFS INTO THE LUNGS IN THE MORNING AND AT BEDTIME. 60 each 5   zolpidem (AMBIEN) 10 MG tablet Take 1 tablet (10 mg total) by mouth at bedtime as needed for sleep. 30 tablet 1   No current facility-administered medications on file prior to visit.    BP 124/88   Pulse 78   Temp 98 F (36.7 C)   Resp 18   Ht 5\' 10"  (1.778 m)   Wt 191 lb (86.6 kg)   SpO2 99%   BMI 27.41 kg/m        Objective:   Physical Exam  General Mental Status- Alert. General Appearance- Not in acute distress.   Skin General: Color- Normal Color. Moisture- Normal Moisture.  Neck Carotid Arteries- Normal color. Moisture- Normal Moisture. No carotid bruits. No JVD.  Chest and Lung Exam Auscultation: Breath Sounds:-Normal.  Cardiovascular Auscultation:Rythm- Regular. Murmurs & Other Heart Sounds:Auscultation of the heart  reveals- No Murmurs.  Abdomen Inspection:-Inspeection Normal. Palpation/Percussion:Note:No mass. Palpation and Percussion of the abdomen reveal- Non Tender, Non  Distended + BS, no rebound or guarding.    Neurologic Cranial Nerve exam:- CN III-XII intact(No nystagmus), symmetric smile. Strength:- 5/5 equal and symmetric strength both upper and lower extremities.   Lower ext- calves not swollen. Symmetric and negative homans signs.  Rt wrist-Positive Finkelstein test indicating tendinopathy at the wrist and base of the thumb. Pain on palpation and movement of the right wrist and thumb, including rotation and flexion. No swelling, redness or induration    Assessment & Plan:   Assessment and Plan    Right Wrist Pain Likely tenosynovitis based on positive Finkelstein test and location of pain. No improvement with prednisone. -Continue use of wrist cock up splint. -Start Medrol 4 mg, 6 to 8 taper dose. -If no improvement by 07/24/2023, consider referral to sports medicine.  Reactive Airways Persistent symptoms despite prednisone and albuterol use. No signs of infection or other systemic disease. cxr negative last week. -Start Medrol 6mg , 6 to 8 taper dose. -Continue use of albuterol as needed. -Order D-dimer test. -If symptoms persist and D-dimer is negative, consider referral back to pulmonologist. if elevated d dimer order ct chest -Advise rest over the weekend. if signs /symptom worsen or change over weekend then be seen in the ED        Esperanza Richters, PA-C

## 2023-07-19 NOTE — Patient Instructions (Addendum)
Right Wrist Pain(tenosynovitis) Likely tenosynovitis based on positive Finkelstein test and location of pain. No improvement with prednisone. -Continue use of wrist cock up splint. -Start Medrol 4 mg, 6 to 8 taper dose. -If no improvement by 07/24/2023, consider referral to sports medicine.  Reactive Airways(persisting bronchitis) antibiotic not indicated presently.) Persistent symptoms despite prednisone and albuterol use. No signs of infection or other systemic disease. cxr negative last week. -Start Medrol 6mg , 6 to 8 taper dose. -Continue use of albuterol as needed. -Order D-dimer test. -If symptoms persist and D-dimer is negative, consider referral back to pulmonologist. if elevated d dimer order ct chest -Advise rest over the weekend. if signs /symptom worsen or change over weekend then be seen in the ED   Dyspnea with decreasing 02 sats despite wixela inhaler, steroid injection, 2 rounds oral steroid and antibiotic. (Pt referred to pulmonologist but they can't see until January). So do want to get ct at this point. Then try other treatment after ct review.

## 2023-07-20 ENCOUNTER — Encounter: Payer: Self-pay | Admitting: Medical

## 2023-07-23 ENCOUNTER — Ambulatory Visit: Payer: BC Managed Care – PPO | Admitting: Sports Medicine

## 2023-07-23 ENCOUNTER — Encounter (HOSPITAL_BASED_OUTPATIENT_CLINIC_OR_DEPARTMENT_OTHER): Payer: Self-pay

## 2023-07-23 ENCOUNTER — Ambulatory Visit (HOSPITAL_BASED_OUTPATIENT_CLINIC_OR_DEPARTMENT_OTHER)
Admission: RE | Admit: 2023-07-23 | Discharge: 2023-07-23 | Disposition: A | Discharge status: Home / Self Care | Payer: BC Managed Care – PPO | Source: Ambulatory Visit | Attending: Medical | Admitting: Medical

## 2023-07-23 ENCOUNTER — Encounter: Payer: Self-pay | Admitting: Sports Medicine

## 2023-07-23 VITALS — BP 136/82 | Ht 70.0 in | Wt 191.0 lb

## 2023-07-23 DIAGNOSIS — R062 Wheezing: Secondary | ICD-10-CM | POA: Insufficient documentation

## 2023-07-23 DIAGNOSIS — M65831 Other synovitis and tenosynovitis, right forearm: Secondary | ICD-10-CM | POA: Diagnosis not present

## 2023-07-23 DIAGNOSIS — R0600 Dyspnea, unspecified: Secondary | ICD-10-CM | POA: Insufficient documentation

## 2023-07-23 MED ORDER — IOHEXOL 350 MG/ML SOLN
100.0000 mL | Freq: Once | INTRAVENOUS | Status: AC | PRN
  Administered 2023-07-23: 75 mL via INTRAVENOUS

## 2023-07-23 MED ORDER — HYDROCODONE-ACETAMINOPHEN 5-325 MG PO TABS: 1.0000 | ORAL_TABLET | Freq: Four times a day (QID) | ORAL | 0 refills | Status: DC | PRN

## 2023-07-23 NOTE — Addendum Note (Signed)
Addended by: Gwenevere Abbot on: 07/23/2023 12:17 PM   Modules accepted: Orders

## 2023-07-23 NOTE — Addendum Note (Signed)
Addended by: Gwenevere Abbot on: 07/23/2023 08:10 AM   Modules accepted: Orders

## 2023-07-23 NOTE — Telephone Encounter (Signed)
I just put in ct chest for pt. See last note. Hopefully you can get prior auth and pt can get study today or tomorrow.

## 2023-07-23 NOTE — Addendum Note (Signed)
Addended by: Gwenevere Abbot on: 07/23/2023 08:13 AM   Modules accepted: Orders

## 2023-07-23 NOTE — Progress Notes (Signed)
   Subjective:    Patient ID: George Ward, male    DOB: 1971-02-13, 52 y.o.   MRN: [id number]  HPI chief complaint: Right wrist pain  Very pleasant 52 year old male presents today with 2 weeks of right wrist pain.  He does not recall anything acute happening to the wrist.  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  However, he began to experience some radial sided wrist pain and swelling that became quite excruciating.  He initially wore a cock up wrist brace but that did not seem to help.  3 days ago he purchased a thumb spica brace which has been a lot more helpful.  He was also recently placed on prednisone for an unrelated pulmonary issue.  That too has helped with his wrist pain.  He is now taking over-the-counter NSAIDs.  Denies similar issues in the past.  Past medical history reviewed Medications reviewed Allergies reviewed    Review of Systems As above    Objective:   Physical Exam  Well-developed, well-nourished.  No acute distress  Right wrist: Full range of motion.  No effusion.  There is no obvious swelling in any of the dorsal wrist compartments.  There is slight tenderness to palpation or compartments 1 and 2 crossover each another.  Positive Finkelstein.  No palpable crepitus.  Good pulses.      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  I recommended that he continue to wear the thumb spica brace during the day for the next week or so.  He will continue with over-the-counter NSAIDs as needed as well.  If his symptoms once again worsen then I would recommend a referral to our [facility] sports medicine clinic for an ultrasound-guided cortisone injection.  Follow-up as needed.  This note was dictated using Dragon naturally speaking software and may contain errors in syntax, spelling, or content which have not been  identified prior to signing this note.

## 2023-07-24 ENCOUNTER — Encounter: Payer: Self-pay | Admitting: Primary Care

## 2023-07-24 ENCOUNTER — Ambulatory Visit: Payer: BC Managed Care – PPO | Admitting: Primary Care

## 2023-07-24 ENCOUNTER — Other Ambulatory Visit
Admission: RE | Admit: 2023-07-24 | Discharge: 2023-07-24 | Disposition: A | Discharge status: Home / Self Care | Payer: BC Managed Care – PPO | Attending: Primary Care | Admitting: Primary Care

## 2023-07-24 VITALS — BP 138/88 | HR 70 | Temp 97.6°F | Ht 70.0 in | Wt 194.6 lb

## 2023-07-24 DIAGNOSIS — J986 Disorders of diaphragm: Secondary | ICD-10-CM

## 2023-07-24 DIAGNOSIS — R052 Subacute cough: Secondary | ICD-10-CM

## 2023-07-24 DIAGNOSIS — R0602 Shortness of breath: Secondary | ICD-10-CM

## 2023-07-24 DIAGNOSIS — J029 Acute pharyngitis, unspecified: Secondary | ICD-10-CM | POA: Insufficient documentation

## 2023-07-24 LAB — POCT EXHALED NITRIC OXIDE: FeNO level (ppb): 16

## 2023-07-24 LAB — GROUP A STREP BY PCR: Group A Strep by PCR: NOT DETECTED

## 2023-07-24 LAB — NITRIC OXIDE: Nitric Oxide: 16

## 2023-07-24 MED ORDER — NYSTATIN 100000 UNIT/ML MT SUSP: 5.0000 mL | Freq: Four times a day (QID) | OROMUCOSAL | 0 refills | Status: DC

## 2023-07-24 MED ORDER — HYDROCODONE BIT-HOMATROP MBR 5-1.5 MG/5ML PO SOLN: 5.0000 mL | Freq: Four times a day (QID) | ORAL | 0 refills | Status: DC | PRN

## 2023-07-24 NOTE — Progress Notes (Signed)
@Patient  ID: George Ward, male    DOB: [birth year], 52 y.o.   MRN: [id number]  Chief Complaint  Patient presents with   Follow-up    Cough, shortness of breath and wheezing.     Referring provider: Marisue Brooklyn  HPI: 52 year old male, former smoker. PMH significant for HTN, asthma, sinusitis, migraine headaches, coronary artery calcification. Patient of Dr. Francine Graven.   Previous [facility] pulmonary encounter: 12/15/21  Phillips Grim is a 52 year old male, former smoker with history of asthma who returns to pulmonary clinic for cough.    The cough was thought to be due to post-viral reactive airways disease vs post-nasal drainage vs GERD.    His ICS inhaler was increased to symbicort 160-4.106mcg and started on ipratropium nasal spray.    CT Chest 12/05/21 shows elevation of left hemidiaphragm with partial compressive atelectasis of left lower lobe. No other infiltrates or opacities.   He reports the cough is overall improved since starting ipratropium nasal spray and using high-dose Symbicort inhaler.  He remains active and is walking about 5 miles per day.   OV 10/11/21 He reports having cough since December. He went to an urgent care while traveling to [place] in early January where he was treated with doxycyline, prednisone and PRN albuterol with some relief. He was then evaluated by his PCP on 09/25/20 where he was treated with azithromycin and prednisone taper. He had laboratory workup for fatigue at that time as well. Chest x-ray from 1/16 shows elevated left hemidiaphragm that is new compared to 05/2020. He complains of his heart racing at times and has intermittent chills.   He continues to have cough that is occasionally productive. He has sinus congestion with post-nasal drainage. He has intermittent reflux, but feels this is not a major issue at this time. He has seasonal allergies and takes claritin daily.    He is a former smoker, smoked in high school and had second hand  smoke from his father. 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   He had left shoulder surgery on 10/22/19 without issues. He has chronic lumbar back issues. No neck pain.    07/24/2023- interim hx  Discussed the use of AI scribe software for clinical note transcription with the patient, who gave verbal consent to proceed.  History of Present Illness   The patient, with a history of asthma, presents with a persistent dry cough of three weeks duration. No sputum production. He describes his lungs as feeling 'weakish' and reports difficulty taking deep breaths. He also reports feeling generally 'run down.' Despite completing a course of doxycycline and two rounds of prednisone, along with an injection of Rocephin, the patient has only experienced minimal improvement. The cough, while better than initially, continues to linger.  The patient has been using Wixela (one puff twice a day) and a nebulizer, which provides temporary relief. He also takes Claritin and uses a nasal spray. Gabapentin (Neurontin) is taken as needed, but not regularly.  The patient underwent a CT scan which was negative for a PE and showed no significant changes from previous scans. However, the scan did reveal an elevation of the hemidiaphragm and some atelectasis on the left side of the lung.  The patient denies any reflux symptoms currently, although he had experienced some 'heartburnish' feelings when the diaphragm issue first started.  Prior to the onset of the cough, the patient was physically active, walking six to seven miles  a day without shortness of breath. The patient is a Personal assistant, and he speculates that exposure to his students and wife's viral illness may have contributed to his current symptoms. Covid testing has been negative.      Allergies  Allergen Reactions   Co-Trimoxazole [Sulfamethoxazole-Trimethoprim] Nausea And Vomiting   Lisinopril Cough    With  Wheezing    Sulfa Antibiotics Nausea And Vomiting    Immunization History  Administered Date(s) Administered   Hepatitis B 01/22/2007, 02/25/2007   Influenza Split 08/24/2016   Influenza, Seasonal, Injecte, Preservative Fre 06/28/2023   Influenza,inj,Quad PF,6+ Mos 09/08/2018, 06/24/2019, 07/18/2021   Influenza-Unspecified 06/11/2014, 06/11/2014, 06/08/2015, 09/08/2018, 07/17/2020   PFIZER(Purple Top)SARS-COV-2 Vaccination 11/14/2019, 12/05/2019   PNEUMOCOCCAL CONJUGATE-20 03/22/2021   PPD Test 11/07/2016   Pfizer Covid-19 Vaccine Bivalent Booster 16yrs & up 07/18/2021   Pfizer(Comirnaty)Fall Seasonal Vaccine 12 years and older 06/28/2023   Pneumococcal Polysaccharide-23 12/01/2012, 06/12/2014   Tdap 08/15/2007, 06/24/2019   Zoster Recombinant(Shingrix) 03/25/2023, 05/30/2023    Past Medical History:  Diagnosis Date   Alcohol abuse    Remission >20 years ago   Allergy    Anxiety    Asthma    Cancer (HCC)    Depression    MAJOR DEPRESSIVE DISORDER   Depression with anxiety    Drug abuse (HCC)    Remission >20 years ago   Environmental allergies    History of chicken pox    Hyperlipidemia    Hypertension    Hypoalphalipoproteinemia, familial    Lumbar pain    Migraines    Primary generalized (osteo)arthritis    Seasonal allergies     Tobacco History: Social History   Tobacco Use  Smoking Status Former   Current packs/day: 0.00   Types: Cigarettes   Quit date: 03/07/1988   Years since quitting: 35.4  Smokeless Tobacco Former   Counseling given: Not Answered   Outpatient Medications Prior to Visit  Medication Sig Dispense Refill   albuterol (PROVENTIL HFA;VENTOLIN HFA) 108 (90 Base) MCG/ACT inhaler Inhale 1-2 puffs into the lungs every 6 (six) hours as needed for wheezing or shortness of breath. Ventolin     amLODipine (NORVASC) 5 MG tablet Take 1 tablet (5 mg total) by mouth daily. 90 tablet 0   atorvastatin (LIPITOR) 80 MG tablet TAKE 0.5 TABLETS BY MOUTH  DAILY. 45 tablet 1   benzonatate (TESSALON) 100 MG capsule Take 1 capsule (100 mg total) by mouth 3 (three) times daily as needed for cough. 30 capsule 0   buPROPion (WELLBUTRIN XL) 150 MG 24 hr tablet Take 150 mg by mouth every morning.     calcium carbonate (TUMS - DOSED IN MG ELEMENTAL CALCIUM) 500 MG chewable tablet Chew 2 tablets by mouth daily as needed for indigestion or heartburn.     doxycycline (VIBRA-TABS) 100 MG tablet Take 1 tablet (100 mg total) by mouth 2 (two) times daily. 20 tablet 0   fenofibrate micronized (LOFIBRA) 67 MG capsule TAKE 1 CAPSULE (67 MG TOTAL) BY MOUTH DAILY BEFORE BREAKFAST. 90 capsule 3   fluticasone (FLONASE) 50 MCG/ACT nasal spray Place 2 sprays into both nostrils daily. 48 mL 1   gabapentin (NEURONTIN) 300 MG capsule Take 300 mg by mouth 3 (three) times daily.     HYDROcodone-acetaminophen (NORCO) 5-325 MG tablet Take 1 tablet by mouth every 6 (six) hours as needed for moderate pain (pain score 4-6). 8 tablet 0   ibuprofen (ADVIL,MOTRIN) 200 MG tablet Take 400 mg by mouth daily  as needed for headache or moderate pain.     ipratropium (ATROVENT) 0.03 % nasal spray PLACE 2 SPRAYS INTO BOTH NOSTRILS EVERY 12 (TWELVE) HOURS. 90 mL 1   loratadine (CLARITIN) 10 MG tablet Take 10 mg by mouth daily as needed for allergies.     LORazepam (ATIVAN) 0.5 MG tablet Take 1 tablet (0.5 mg total) by mouth 2 (two) times daily as needed for anxiety. 60 tablet 1   methylPREDNISolone (MEDROL) 4 MG tablet Standard 6 day taper 21 tablet 0   Multiple Vitamin (MULTIVITAMIN WITH MINERALS) TABS tablet Take 1 tablet by mouth daily.     predniSONE (STERAPRED UNI-PAK 21 TAB) 10 MG (21) TBPK tablet Taper over 6 days. 21 tablet 0   propranolol (INDERAL) 20 MG tablet TAKE 1 TABLET BY MOUTH TWICE A DAY 180 tablet 1   QUEtiapine (SEROQUEL) 50 MG tablet Take 2 tablets (100 mg total) by mouth at bedtime. 180 tablet 1   SUMAtriptan (IMITREX) 50 MG tablet 1 TABLET DAILY , MAY REPEAT IN 2 HOURS IF  HEADACHE PERSISTS OR RECURS. 9 tablet 2   triamterene-hydrochlorothiazide (MAXZIDE-25) 37.5-25 MG tablet TAKE 1 TABLET BY MOUTH EVERY DAY 90 tablet 1   WIXELA INHUB 500-50 MCG/ACT AEPB INHALE 2 PUFFS INTO THE LUNGS IN THE MORNING AND AT BEDTIME. 60 each 5   zolpidem (AMBIEN) 10 MG tablet Take 1 tablet (10 mg total) by mouth at bedtime as needed for sleep. 30 tablet 1   FLUoxetine (PROZAC) 40 MG capsule Take 2 capsules (80 mg total) by mouth daily. 180 capsule 1   No facility-administered medications prior to visit.      Review of Systems  Review of Systems  HENT:  Negative for sore throat.   Respiratory:  Positive for cough and shortness of breath.      Physical Exam  There were no vitals taken for this visit. Physical Exam Constitutional:      Appearance: Normal appearance.  Cardiovascular:     Rate and Rhythm: Normal rate and regular rhythm.  Pulmonary:     Effort: Pulmonary effort is normal.     Breath sounds: No wheezing.     Comments: Diminished left base  Neurological:     General: No focal deficit present.     Mental Status: He is alert and oriented to person, place, and time. Mental status is at baseline.      Lab Results:  CBC    Component Value Date/Time   WBC 12.7 (H) 07/17/2023 1512   RBC 4.69 07/17/2023 1512   HGB 12.9 (L) 07/17/2023 1512   HCT 39.8 07/17/2023 1512   PLT 384.0 07/17/2023 1512   MCV 84.9 07/17/2023 1512   MCH 28.2 06/01/2020 0834   MCHC 32.4 07/17/2023 1512   RDW 14.0 07/17/2023 1512   LYMPHSABS 3.5 07/17/2023 1512   MONOABS 1.1 (H) 07/17/2023 1512   EOSABS 0.2 07/17/2023 1512   BASOSABS 0.0 07/17/2023 1512    BMET    Component Value Date/Time   NA 138 07/17/2023 1512   K 3.6 07/17/2023 1512   CL 102 07/17/2023 1512   CO2 31 07/17/2023 1512   GLUCOSE 102 (H) 07/17/2023 1512   BUN 26 (H) 07/17/2023 1512   CREATININE 0.99 07/17/2023 1512   CREATININE 1.03 06/01/2020 0834   CALCIUM 9.0 07/17/2023 1512   GFRNONAA >60  03/28/2018 0830   GFRAA >60 03/28/2018 0830    BNP No results found for: "BNP"  ProBNP No results found for: "PROBNP"  Imaging: CT Angio Chest Pulmonary Embolism (PE) W or WO Contrast  Result Date: 07/23/2023 CLINICAL DATA:  Dyspnea. EXAM: CT ANGIOGRAPHY CHEST WITH CONTRAST TECHNIQUE: Multidetector CT imaging of the chest was performed using the standard protocol during bolus administration of intravenous contrast. Multiplanar CT image reconstructions and MIPs were obtained to evaluate the vascular anatomy. RADIATION DOSE REDUCTION: This exam was performed according to the departmental dose-optimization program which includes automated exposure control, adjustment of the mA and/or kV according to patient size and/or use of iterative reconstruction technique. CONTRAST:  75mL OMNIPAQUE IOHEXOL 350 MG/ML SOLN COMPARISON:  Chest x-ray 12/05/2021 FINDINGS: Cardiovascular: Satisfactory opacification of the pulmonary arteries to the segmental level. No evidence of pulmonary embolism. Normal heart size. No pericardial effusion. Mediastinum/Nodes: No enlarged mediastinal, hilar, or axillary lymph nodes. Thyroid gland, trachea, and esophagus demonstrate no significant findings. Lungs/Pleura: Again seen is moderate elevation of the left hemidiaphragm with atelectasis in the left lower lobe and lingula. There is no pleural effusion or pneumothorax. Upper Abdomen: There is a rounded hypodensity in the spleen measuring 2 cm which is unchanged from the prior examination, possibly a cyst or hemangioma. Musculoskeletal: No chest wall abnormality. No acute or significant osseous findings. Review of the MIP images confirms the above findings. IMPRESSION: 1. No evidence for pulmonary embolism. 2. Moderate elevation of the left hemidiaphragm with atelectasis in the left lower lobe and lingula. Electronically Signed   By: Darliss Cheney M.D.   On: 07/23/2023 17:32   DG Chest 2 View  Result Date: 07/17/2023 CLINICAL DATA:   Persistent cough and wheezing. EXAM: CHEST - 2 VIEW COMPARISON:  Chest radiograph dated 09/06/2022. FINDINGS: There is eventration of the left hemidiaphragm with left lung base atelectasis. No focal consolidation, pleural effusion or pneumothorax. The cardiac silhouette is within normal limits. No acute osseous pathology. IMPRESSION: No active cardiopulmonary disease. Electronically Signed   By: Elgie Collard M.D.   On: 07/17/2023 17:16     Assessment & Plan:   1. Subacute cough - POCT EXHALED NITRIC OXIDE - DG Sniff Test; Future  2. Elevated hemidiaphragm - DG Sniff Test; Future  3. Shortness of breath - Nitric oxide  4. Sore throat - Culture, Group A Strep   Asthma Persistent dry cough for 3 weeks, despite treatment with doxycycline, prednisone, and Rocephin. Minimal improvement with prednisone. Currently on Wixela and using a nebulizer with temporary relief. Asthma does not appear overt exacerbated. No signs of bacterial infection, abx not indicated. Lungs were clear on exam without overt wheezing, diminished left base  -Continue Wixela one puff twice a day. -Performed exhaled nitric oxide test to assess for uncontrolled asthma which was normal (16) -Holding off on additional prednisone at the moment due to thrush symptoms.   Elevated Hemidiaphragm CTA negative for PE, moderate elevated of left hemidiaphragm with atelectasis in the left lower lobe. These results appear unchanged from previous. . -Order sniff test to evaluate diaphragm mobility and potential paralysis. -Consider referral for potential surgical intervention if significant issues are identified.  Possible Gastroesophageal Reflux Disease (GERD) History of intermittent reflux, which can contribute to cough. -Start over-the-counter omeprazole 20mg  twice a day, 30 minutes before breakfast and dinner until follow-up in 2-4 weeks.  Cough Management - Continue treatment for PND with ipratropium nasal spray and  possible GERD with PPI - RX Hycodan 5ml QID for cough suppression - Advise patient to avoid coughing, keep lozenges on hand, and take sips of water when feeling the urge to cough.  Thrush -  Patient has numerous white patches to the back of his throat on exam. Although he denies pharyngitis symptoms recommend empirically treating for thrush with Nystatin. Checking throat culture   Follow-up 2-4 weeks in office visit     Glenford Bayley, NP 07/24/2023

## 2023-07-24 NOTE — Patient Instructions (Addendum)
FENO was normal Lungs were clear, diminished left side. No wheezing  CTA negative for pulmonary embolism, appears unchanged from previous   Recommendations: Start omeprazole 20mg  BID until follow-up Continue Ipratropium nasal spray twice daily  Start Nystatin s/s for possible thrush to back of your throat which cough be contributing to your cough I'm going to holding off on steroids cause this could be causing the thrush symptoms  No antibiotic indicated at this time Rinse mouth after using inhaler  Hycodan cough syrup at bedtime for cough suppression (caution with gabapentin may increased drowsiness, discontinue if you experience this)  Use throat lozenges and sips of water to prevent cough   Orders: Step and/or Throat culture  SNIFF test to evaluate hemidiaphragm (I dont think this is the cause of your acute cough but should be elevated)   Follow-up: 2-4 week visit in person/ ok to double book on a day I am not already

## 2023-07-27 LAB — CULTURE, GROUP A STREP (THRC)

## 2023-08-09 ENCOUNTER — Other Ambulatory Visit: Payer: Self-pay | Admitting: Cardiology

## 2023-08-09 ENCOUNTER — Other Ambulatory Visit: Payer: Self-pay | Admitting: Medical

## 2023-08-09 DIAGNOSIS — I2510 Atherosclerotic heart disease of native coronary artery without angina pectoris: Secondary | ICD-10-CM

## 2023-08-09 DIAGNOSIS — E785 Hyperlipidemia, unspecified: Secondary | ICD-10-CM

## 2023-08-10 ENCOUNTER — Other Ambulatory Visit: Payer: Self-pay | Admitting: Medical

## 2023-08-12 ENCOUNTER — Other Ambulatory Visit: Payer: BC Managed Care – PPO

## 2023-08-15 ENCOUNTER — Ambulatory Visit: Payer: BC Managed Care – PPO | Attending: Cardiology | Admitting: Cardiology

## 2023-08-15 ENCOUNTER — Encounter: Payer: Self-pay | Admitting: Cardiology

## 2023-08-15 VITALS — BP 127/82 | HR 70 | Ht 70.0 in | Wt 199.0 lb

## 2023-08-15 DIAGNOSIS — I10 Essential (primary) hypertension: Secondary | ICD-10-CM | POA: Diagnosis not present

## 2023-08-15 DIAGNOSIS — I2510 Atherosclerotic heart disease of native coronary artery without angina pectoris: Secondary | ICD-10-CM

## 2023-08-15 DIAGNOSIS — E785 Hyperlipidemia, unspecified: Secondary | ICD-10-CM

## 2023-08-15 NOTE — Patient Instructions (Addendum)
Medication Instructions:  Your physician recommends that you continue on your current medications as directed. Please refer to the Current Medication list given to you today.  *If you need a refill on your cardiac medications before your next appointment, please call your pharmacy*   Lab Work: None Ordered If you have labs (blood work) drawn today and your tests are completely normal, you will receive your results only by: MyChart Message (if you have MyChart) OR A paper copy in the mail If you have any lab test that is abnormal or we need to change your treatment, we will call you to review the results.   Testing/Procedures: We will order CT coronary calcium score. It will cost $99.00 and is not covered by insurance.  Please call to schedule.    [facility] [place] [address] [place], [place] [address] [phone]     Follow-Up: At [facility], you and your health needs are our priority.  As part of our continuing mission to provide you with exceptional heart care, we have created designated Provider Care Teams.  These Care Teams include your primary Cardiologist (physician) and Advanced Practice Providers (APPs -  Physician Assistants and Nurse Practitioners) who all work together to provide you with the care you need, when you need it.  We recommend signing up for the patient portal called "MyChart".  Sign up information is provided on this After Visit Summary.  MyChart is used to connect with patients for Virtual Visits (Telemedicine).  Patients are able to view lab/test results, encounter notes, upcoming appointments, etc.  Non-urgent messages can be sent to your provider as well.   To learn more about what you can do with MyChart, go to [email or web address].    Your next appointment:   12 month(s)  The format for your next appointment:   In Person  Provider:   Robert Krasowski, MD    Other Instructions NA  

## 2023-08-15 NOTE — Progress Notes (Signed)
Cardiology Office Note:    Date:  08/15/2023   ID:  DMAURI HUSSEIN, DOB 06/13/1971, MRN [id number]  PCP:  Esperanza Richters, PA-C  Cardiologist:  Gypsy Balsam, MD    Referring MD: Esperanza Richters, [place]   Chief Complaint  Patient presents with   Annual Exam    History of Present Illness:    George Ward is a 52 y.o. male past medical history significant for calcifications noted on the CT of his chest of his coronary arteries, essential hypertension, dyspnea exertion, dyslipidemia.  He did have a stress test done he walked 12 minutes on treadmill asymptomatic.  Comes today for follow-up overall doing great.  Denies have any chest pain tightness squeezing pressure burning chest no palpitation dizziness swelling of lower extremities  Past Medical History:  Diagnosis Date   Alcohol abuse    Remission >20 years ago   Allergy    Anxiety    Asthma    Cancer (HCC)    Depression    MAJOR DEPRESSIVE DISORDER   Depression with anxiety    Drug abuse (HCC)    Remission >20 years ago   Environmental allergies    History of chicken pox    Hyperlipidemia    Hypertension    Hypoalphalipoproteinemia, familial    Lumbar pain    Migraines    Primary generalized (osteo)arthritis    Seasonal allergies     Past Surgical History:  Procedure Laterality Date   EXCISION MASS HEAD  03/17/2018   excision cells on head  ; still in testing for cancer per patient report    LUMBAR EPIDURAL INJECTION     SHOULDER ARTHROSCOPY Left 2021   UMBILICAL HERNIA REPAIR N/A 04/02/2018   Procedure: OPEN UMBILICAL HERNIA REPAIR WITH INSERTION OF MESH;  Surgeon: Andria Meuse, MD;  Location: [facility];  Service: General;  Laterality: N/A;   WISDOM TOOTH EXTRACTION  1989-1990    Current Medications: Current Meds  Medication Sig   albuterol (PROVENTIL HFA;VENTOLIN HFA) 108 (90 Base) MCG/ACT inhaler Inhale 1-2 puffs into the lungs every 6 (six) hours as needed for wheezing or shortness of breath.  Ventolin   amLODipine (NORVASC) 5 MG tablet Take 1 tablet (5 mg total) by mouth daily.   atorvastatin (LIPITOR) 80 MG tablet Take 0.5 tablets (40 mg total) by mouth daily. 1rst attempt, patient needs and appt for additional refills   buPROPion (WELLBUTRIN XL) 150 MG 24 hr tablet Take 150 mg by mouth every morning.   calcium carbonate (TUMS - DOSED IN MG ELEMENTAL CALCIUM) 500 MG chewable tablet Chew 2 tablets by mouth daily as needed for indigestion or heartburn.   fenofibrate micronized (LOFIBRA) 67 MG capsule TAKE 1 CAPSULE (67 MG TOTAL) BY MOUTH DAILY BEFORE BREAKFAST.   FLUoxetine (PROZAC) 40 MG capsule Take 2 capsules (80 mg total) by mouth daily.   fluticasone (FLONASE) 50 MCG/ACT nasal spray Place 2 sprays into both nostrils daily.   gabapentin (NEURONTIN) 300 MG capsule Take 300 mg by mouth 3 (three) times daily.   ibuprofen (ADVIL,MOTRIN) 200 MG tablet Take 400 mg by mouth daily as needed for headache or moderate pain.   ipratropium (ATROVENT) 0.03 % nasal spray PLACE 2 SPRAYS INTO BOTH NOSTRILS EVERY 12 (TWELVE) HOURS.   loratadine (CLARITIN) 10 MG tablet Take 10 mg by mouth daily as needed for allergies.   LORazepam (ATIVAN) 0.5 MG tablet Take 1 tablet (0.5 mg total) by mouth 2 (two) times daily as needed for anxiety.  Multiple Vitamin (MULTIVITAMIN WITH MINERALS) TABS tablet Take 1 tablet by mouth daily.   propranolol (INDERAL) 20 MG tablet TAKE 1 TABLET BY MOUTH TWICE A DAY   QUEtiapine (SEROQUEL) 50 MG tablet Take 2 tablets (100 mg total) by mouth at bedtime.   SUMAtriptan (IMITREX) 50 MG tablet 1 TABLET DAILY , MAY REPEAT IN 2 HOURS IF HEADACHE PERSISTS OR RECURS.   triamterene-hydrochlorothiazide (MAXZIDE-25) 37.5-25 MG tablet TAKE 1 TABLET BY MOUTH EVERY DAY   WIXELA INHUB 500-50 MCG/ACT AEPB INHALE 2 PUFFS INTO THE LUNGS IN THE MORNING AND AT BEDTIME.   zolpidem (AMBIEN) 10 MG tablet Take 1 tablet (10 mg total) by mouth at bedtime as needed for sleep.   [DISCONTINUED]  HYDROcodone bit-homatropine (HYCODAN) 5-1.5 MG/5ML syrup Take 5 mLs by mouth every 6 (six) hours as needed for cough.     Allergies:   Co-trimoxazole [sulfamethoxazole-trimethoprim], Lisinopril, and Sulfa antibiotics   Social History   Socioeconomic History   Marital status: Married    Spouse name: Not on file   Number of children: Not on file   Years of education: Not on file   Highest education level: Master's degree (e.g., MA, MS, MEng, MEd, MSW, MBA)  Occupational History   Not on file  Tobacco Use   Smoking status: Former    Current packs/day: 0.00    Types: Cigarettes    Quit date: 03/07/1988    Years since quitting: 35.4   Smokeless tobacco: Former  Building services engineer status: Never Used  Substance and Sexual Activity   Alcohol use: Not Currently    Comment: 1/month   Drug use: Not Currently   Sexual activity: Yes  Other Topics Concern   Not on file  Social History Narrative   Not on file   Social Determinants of Health   Financial Resource Strain: Low Risk  (03/13/2023)   Overall Financial Resource Strain (CARDIA)    Difficulty of Paying Living Expenses: Not hard at all  Food Insecurity: No Food Insecurity (03/13/2023)   Hunger Vital Sign    Worried About Running Out of Food in the Last Year: Never true    Ran Out of Food in the Last Year: Never true  Transportation Needs: No Transportation Needs (03/13/2023)   PRAPARE - Administrator, Civil Service (Medical): No    Lack of Transportation (Non-Medical): No  Physical Activity: Sufficiently Active (03/13/2023)   Exercise Vital Sign    Days of Exercise per Week: 5 days    Minutes of Exercise per Session: 60 min  Stress: Stress Concern Present (03/13/2023)   [employer] of Occupational Health - Occupational Stress Questionnaire    Feeling of Stress : To some extent  Social Connections: Moderately Isolated (03/13/2023)   Social Connection and Isolation Panel [NHANES]    Frequency of Communication with  Friends and Family: Once a week    Frequency of Social Gatherings with Friends and Family: More than three times a week    Attends Religious Services: Never    Database administrator or Organizations: No    Attends Engineer, structural: Not on file    Marital Status: Married     Family History: The patient's family history includes Colon cancer in his maternal grandfather; Congestive Heart Failure in his maternal grandfather; Dementia in his paternal grandmother; Healthy in his daughter, sister, and son; Heart attack in his paternal grandfather; Heart disease in his paternal grandfather; Hepatitis in his paternal aunt; Hepatitis C  in his paternal aunt; Hyperlipidemia in his father, maternal uncle, and paternal uncle; Hypertension in his maternal uncle, mother, and paternal uncle; Prostate cancer in his maternal grandfather. There is no history of Esophageal cancer, Stomach cancer, or Rectal cancer. ROS:   Please see the history of present illness.    All 14 point review of systems negative except as described per history of present illness  EKGs/Labs/Other Studies Reviewed:         Recent Labs: 07/17/2023: ALT 29; BUN 26; Creatinine, Ser 0.99; Hemoglobin 12.9; Platelets 384.0; Potassium 3.6; Sodium 138  Recent Lipid Panel    Component Value Date/Time   CHOL 144 03/25/2023 1425   CHOL 169 04/10/2022 1007   TRIG 104.0 03/25/2023 1425   HDL 44.40 03/25/2023 1425   HDL 42 04/10/2022 1007   CHOLHDL 3 03/25/2023 1425   VLDL 20.8 03/25/2023 1425   LDLCALC 79 03/25/2023 1425   LDLCALC 100 (H) 04/10/2022 1007   LDLDIRECT 81.0 10/19/2019 0740    Physical Exam:    VS:  BP 127/82 (BP Location: Right Arm, Patient Position: Sitting, Cuff Size: Normal)   Pulse 70   Ht 5\' 10"  (1.778 m)   Wt 199 lb (90.3 kg)   SpO2 94%   BMI 28.55 kg/m     Wt Readings from Last 3 Encounters:  08/15/23 199 lb (90.3 kg)  07/24/23 194 lb 9.6 oz (88.3 kg)  07/23/23 191 lb (86.6 kg)     GEN:   Well nourished, well developed in no acute distress HEENT: Normal NECK: No JVD; No carotid bruits LYMPHATICS: No lymphadenopathy CARDIAC: RRR, no murmurs, no rubs, no gallops RESPIRATORY:  Clear to auscultation without rales, wheezing or rhonchi  ABDOMEN: Soft, non-tender, non-distended MUSCULOSKELETAL:  No edema; No deformity  SKIN: Warm and dry LOWER EXTREMITIES: no swelling NEUROLOGIC:  Alert and oriented x 3 PSYCHIATRIC:  Normal affect   ASSESSMENT:    1. Essential hypertension   2. Coronary artery calcification   3. Dyslipidemia    PLAN:    In order of problems listed above:  Essential hypertension: Blood pressure well-controlled. Calcification of the coronary arteries we do not have quantitative assessment of the amount of calcification I think there is some value in doing calcium score based on that we will be able to determine how aggressive we need to be with management of his cholesterol.  Likely he is completely asymptomatic. Dyslipidemia he is taking cholesterol medication which I will continue.  I did review his K PN which show me his LDL of 79 calcium score will help [place] to determine how aggressive we need to be. We did talk about need to exercise which she already does on the regular basis.   Medication Adjustments/Labs and Tests Ordered: Current medicines are reviewed at length with the patient today.  Concerns regarding medicines are outlined above.  Orders Placed This Encounter  Procedures   EKG 12-Lead   Medication changes: No orders of the defined types were placed in this encounter.   Signed, Georgeanna Lea, MD, [facility] 08/15/2023 4:34 PM    [facility]

## 2023-08-19 ENCOUNTER — Ambulatory Visit (HOSPITAL_BASED_OUTPATIENT_CLINIC_OR_DEPARTMENT_OTHER)
Admission: RE | Admit: 2023-08-19 | Discharge: 2023-08-19 | Disposition: A | Discharge status: Home / Self Care | Payer: BC Managed Care – PPO | Source: Ambulatory Visit | Attending: Cardiology | Admitting: Cardiology

## 2023-08-19 ENCOUNTER — Ambulatory Visit: Payer: BC Managed Care – PPO | Admitting: Primary Care

## 2023-08-19 DIAGNOSIS — I2510 Atherosclerotic heart disease of native coronary artery without angina pectoris: Secondary | ICD-10-CM | POA: Insufficient documentation

## 2023-09-05 ENCOUNTER — Other Ambulatory Visit: Payer: Self-pay | Admitting: Medical

## 2023-09-08 ENCOUNTER — Encounter: Payer: Self-pay | Admitting: Cardiology

## 2023-09-10 ENCOUNTER — Ambulatory Visit: Payer: BC Managed Care – PPO | Admitting: Medical

## 2023-09-10 ENCOUNTER — Other Ambulatory Visit: Payer: Self-pay | Admitting: Medical

## 2023-09-10 ENCOUNTER — Ambulatory Visit: Payer: BC Managed Care – PPO

## 2023-09-10 VITALS — BP 130/82 | HR 78 | Resp 18 | Ht 70.0 in | Wt 202.0 lb

## 2023-09-10 DIAGNOSIS — M542 Cervicalgia: Secondary | ICD-10-CM | POA: Diagnosis not present

## 2023-09-10 DIAGNOSIS — Z9181 History of falling: Secondary | ICD-10-CM | POA: Diagnosis not present

## 2023-09-10 DIAGNOSIS — M25561 Pain in right knee: Secondary | ICD-10-CM

## 2023-09-10 MED ORDER — METHYLPREDNISOLONE 4 MG PO TABS: ORAL_TABLET | ORAL | 0 refills | Status: DC

## 2023-09-10 NOTE — Progress Notes (Signed)
   Subjective:    Patient ID: TEMITAYO COVALT, male    DOB: 1970-12-24, 52 y.o.   MRN: [id number]  HPI Discussed the use of AI scribe software for clinical note transcription with the patient, who gave verbal consent to proceed.  History of Present Illness   The patient, with a history of knee pain, presents with new onset neck and knee pain following an incident of playing with a large dog a couple of weeks ago. The patient reports that the pain in both areas is about the same and it is difficult to differentiate severity difference  between the two.  The knee pain is constant and is exacerbated by both straightening and bending the knee. The patient also reports an incident of loss of strength in the knee when stepping off a scale, almost causing a loss of balance. The patient has been managing the pain with ibuprofen 600mg , but reports minimal relief.  The neck pain is associated with occipital/trapezius junction headaches and numbness and tingling. The pain is localized to the left side of the neck and radiates towards the trapezius shoulder and down to the elbow. The pain and associated symptoms are relieved when the patient turns their head to the right. The patient has been managing the neck pain with positional changes and ibuprofen, with minimal relief.             Review of Systems  See hpi    Objective:   Physical Exam General- No acute distress. Pleasant patient. Neck- Full range of motion, no jvd Lungs- Clear, even and unlabored. Heart- regular rate and rhythm. Neurologic- CNII- XII grossly intact.  MUSCULOSKELETAL: Right knee pain with full extension and tenderness on the medial aspect. Neck pain extending to the trapezius muscle and down the arm upon rotation to the left, with gradual resolution upon rotation to the right. Pain on palpation in the middle and to the side of the neck.     Assessment & Plan:   Assessment and Plan    Cervical Radiculopathy Neck pain  with radiation to the trapezius and down the arm, exacerbated by turning the head to the left. Pain is relieved by turning the head to the right. -Order cervical spine x-ray. -Start Medrol  6-day taper. -Check in on day 6 of Medrol  to assess response.  Right Knee Pain Constant pain with full extension and flexion, exacerbated by pivoting. Previous x-ray 5 years ago was normal. -Order right knee x-ray. -dc ibuprofen -Use knee brace for support. -Start Medrol  6-day taper. -Consider referral to sports medicine depending on x-ray results. -Check in on day 6 of Medrol  to assess response.        Clara Herbison, PA-C

## 2023-09-10 NOTE — Patient Instructions (Signed)
 Cervical Radiculopathy Neck pain with radiation to the trapezius and down the arm, exacerbated by turning the head to the left. Pain is relieved by turning the head to the right. -Order cervical spine x-ray. -Start Medrol  6-day taper. -Check in on day 6 of Medrol  to assess response.  Right Knee Pain Constant pain with full extension and flexion, exacerbated by pivoting. Previous x-ray 5 years ago was normal. -Order right knee x-ray. -dc ibuprofen -Use knee brace for support. -Start Medrol  6-day taper. -Consider referral to sports medicine depending on x-ray results. -Check in on day 6 of Medrol  to assess response.

## 2023-09-11 NOTE — Addendum Note (Signed)
 Addended by: Gwenevere Abbot on: 09/11/2023 06:46 AM   Modules accepted: Orders

## 2023-09-16 ENCOUNTER — Encounter: Payer: Self-pay | Admitting: Medical

## 2023-09-17 ENCOUNTER — Other Ambulatory Visit: Payer: Self-pay | Admitting: Cardiology

## 2023-09-17 DIAGNOSIS — E785 Hyperlipidemia, unspecified: Secondary | ICD-10-CM

## 2023-09-17 DIAGNOSIS — I2510 Atherosclerotic heart disease of native coronary artery without angina pectoris: Secondary | ICD-10-CM

## 2023-09-18 ENCOUNTER — Encounter: Payer: Self-pay | Admitting: Cardiology

## 2023-09-18 MED ORDER — ATORVASTATIN CALCIUM 80 MG PO TABS: 40.0000 mg | ORAL_TABLET | Freq: Every day | ORAL | 3 refills | Status: DC

## 2023-09-19 ENCOUNTER — Ambulatory Visit: Payer: Self-pay | Admitting: Sports Medicine

## 2023-09-23 ENCOUNTER — Ambulatory Visit: Payer: 59 | Attending: Cardiology | Admitting: Cardiology

## 2023-09-23 ENCOUNTER — Ambulatory Visit: Payer: BC Managed Care – PPO | Admitting: Primary Care

## 2023-09-23 ENCOUNTER — Encounter: Payer: Self-pay | Admitting: Cardiology

## 2023-09-23 VITALS — BP 106/76 | HR 74 | Ht 70.0 in | Wt 206.8 lb

## 2023-09-23 DIAGNOSIS — I10 Essential (primary) hypertension: Secondary | ICD-10-CM

## 2023-09-23 DIAGNOSIS — I2510 Atherosclerotic heart disease of native coronary artery without angina pectoris: Secondary | ICD-10-CM | POA: Diagnosis not present

## 2023-09-23 DIAGNOSIS — E785 Hyperlipidemia, unspecified: Secondary | ICD-10-CM

## 2023-09-23 MED ORDER — ATORVASTATIN CALCIUM 80 MG PO TABS: 80.0000 mg | ORAL_TABLET | Freq: Every day | ORAL | 3 refills | Status: DC

## 2023-09-23 NOTE — Progress Notes (Signed)
 Cardiology Office Note:    Date:  09/23/2023   ID:  George Ward, DOB [birth year], MRN [id number]  PCP:  Dorina Loving, PA-C  Cardiologist:  Lamar Fitch, MD    Referring MD: Dorina Loving RIGGERS   Chief Complaint  Patient presents with   Results    History of Present Illness:    George Ward is a 53 y.o. male past medical history significant for Constant noted on the coronary artery and CT.  After that he got coronary calcium  score done which was 237 which is 53 percentile.  Also essential hypertension, dyspnea on exertion, dyslipidemia.  He did have a stress test to rule out obstructive disease he walked 12 minutes on the treadmill asymptomatic.  No chest pain tightness squeezing pressure burning chest, stress test negative. Comes today to my office to discuss results of his coronary calcium  score  Past Medical History:  Diagnosis Date   Alcohol abuse    Remission >20 years ago   Allergy    Anxiety    Asthma    Cancer (HCC)    Depression    MAJOR DEPRESSIVE DISORDER   Depression with anxiety    Drug abuse (HCC)    Remission >20 years ago   Environmental allergies    History of chicken pox    Hyperlipidemia    Hypertension    Hypoalphalipoproteinemia, familial    Lumbar pain    Migraines    Primary generalized (osteo)arthritis    Seasonal allergies     Past Surgical History:  Procedure Laterality Date   EXCISION MASS HEAD  03/17/2018   excision cells on head  ; still in testing for cancer per patient report    LUMBAR EPIDURAL INJECTION     SHOULDER ARTHROSCOPY Left 2021   UMBILICAL HERNIA REPAIR N/A 04/02/2018   Procedure: OPEN UMBILICAL HERNIA REPAIR WITH INSERTION OF MESH;  Surgeon: Teresa Lonni HERO, MD;  Location: [facility];  Service: General;  Laterality: N/A;   WISDOM TOOTH EXTRACTION  1989-1990    Current Medications: Current Meds  Medication Sig   albuterol (PROVENTIL HFA;VENTOLIN HFA) 108 (90 Base) MCG/ACT inhaler Inhale 1-2 puffs into  the lungs every 6 (six) hours as needed for wheezing or shortness of breath. Ventolin   amLODipine  (NORVASC ) 5 MG tablet TAKE 1 TABLET (5 MG TOTAL) BY MOUTH DAILY.   atorvastatin  (LIPITOR) 80 MG tablet Take 0.5 tablets (40 mg total) by mouth daily. 1rst attempt, patient needs and appt for additional refills   buPROPion (WELLBUTRIN XL) 150 MG 24 hr tablet Take 150 mg by mouth every morning.   calcium  carbonate (TUMS - DOSED IN MG ELEMENTAL CALCIUM ) 500 MG chewable tablet Chew 2 tablets by mouth daily as needed for indigestion or heartburn.   fenofibrate  micronized (LOFIBRA) 67 MG capsule TAKE 1 CAPSULE (67 MG TOTAL) BY MOUTH DAILY BEFORE BREAKFAST. (Patient taking differently: Take 67 mg by mouth daily before breakfast.)   FLUoxetine  (PROZAC ) 40 MG capsule Take 2 capsules (80 mg total) by mouth daily.   fluticasone  (FLONASE ) 50 MCG/ACT nasal spray Place 2 sprays into both nostrils daily.   gabapentin  (NEURONTIN ) 300 MG capsule Take 300 mg by mouth 3 (three) times daily.   ibuprofen (ADVIL,MOTRIN) 200 MG tablet Take 400 mg by mouth daily as needed for headache or moderate pain.   ipratropium (ATROVENT ) 0.03 % nasal spray PLACE 2 SPRAYS INTO BOTH NOSTRILS EVERY 12 (TWELVE) HOURS.   loratadine (CLARITIN) 10 MG tablet Take 10 mg by mouth daily  as needed for allergies.   LORazepam  (ATIVAN ) 0.5 MG tablet Take 1 tablet (0.5 mg total) by mouth 2 (two) times daily as needed for anxiety.   methylPREDNISolone  (MEDROL ) 4 MG tablet Standard 6 day taper medrol  dose pack. (Patient taking differently: Take 4 mg by mouth See admin instructions. Standard 6 day taper medrol  dose pack.)   Multiple Vitamin (MULTIVITAMIN WITH MINERALS) TABS tablet Take 1 tablet by mouth daily.   propranolol  (INDERAL ) 20 MG tablet TAKE 1 TABLET BY MOUTH TWICE A DAY (Patient taking differently: Take 20 mg by mouth 2 (two) times daily.)   QUEtiapine  (SEROQUEL ) 50 MG tablet Take 2 tablets (100 mg total) by mouth at bedtime.   SUMAtriptan   (IMITREX ) 50 MG tablet 1 TABLET DAILY , MAY REPEAT IN 2 HOURS IF HEADACHE PERSISTS OR RECURS. (Patient taking differently: Take 50 mg by mouth See admin instructions. 1 tablet daily , May repeat in 2 hours if headache persists or recurs.)   triamterene -hydrochlorothiazide (MAXZIDE-25) 37.5-25 MG tablet TAKE 1 TABLET BY MOUTH EVERY DAY (Patient taking differently: Take 1 tablet by mouth daily.)   WIXELA INHUB 500-50 MCG/ACT AEPB INHALE 2 PUFFS INTO THE LUNGS IN THE MORNING AND AT BEDTIME.   zolpidem  (AMBIEN ) 10 MG tablet Take 1 tablet (10 mg total) by mouth at bedtime as needed for sleep.     Allergies:   Co-trimoxazole [sulfamethoxazole-trimethoprim], Lisinopril, and Sulfa antibiotics   Social History   Socioeconomic History   Marital status: Married    Spouse name: Not on file   Number of children: Not on file   Years of education: Not on file   Highest education level: Master's degree (e.g., MA, MS, MEng, MEd, MSW, MBA)  Occupational History   Not on file  Tobacco Use   Smoking status: Former    Current packs/day: 0.00    Types: Cigarettes    Quit date: 03/07/1988    Years since quitting: 35.5   Smokeless tobacco: Former  Building Services Engineer status: Never Used  Substance and Sexual Activity   Alcohol use: Not Currently    Comment: 1/month   Drug use: Not Currently   Sexual activity: Yes  Other Topics Concern   Not on file  Social History Narrative   Not on file   Social Drivers of Health   Financial Resource Strain: Low Risk  (09/08/2023)   Overall Financial Resource Strain (CARDIA)    Difficulty of Paying Living Expenses: Not hard at all  Food Insecurity: No Food Insecurity (09/08/2023)   Hunger Vital Sign    Worried About Running Out of Food in the Last Year: Never true    Ran Out of Food in the Last Year: Never true  Transportation Needs: No Transportation Needs (09/08/2023)   PRAPARE - Administrator, Civil Service (Medical): No    Lack of  Transportation (Non-Medical): No  Physical Activity: Sufficiently Active (09/08/2023)   Exercise Vital Sign    Days of Exercise per Week: 5 days    Minutes of Exercise per Session: 120 min  Stress: Stress Concern Present (09/08/2023)   [employer] of Occupational Health - Occupational Stress Questionnaire    Feeling of Stress : To some extent  Social Connections: Moderately Isolated (09/08/2023)   Social Connection and Isolation Panel [NHANES]    Frequency of Communication with Friends and Family: Three times a week    Frequency of Social Gatherings with Friends and Family: Once a week    Attends Religious Services:  Never    Active Member of Clubs or Organizations: No    Attends Banker Meetings: Not on file    Marital Status: Married     Family History: The patient's family history includes Colon cancer in his maternal grandfather; Congestive Heart Failure in his maternal grandfather; Dementia in his paternal grandmother; Healthy in his daughter, sister, and son; Heart attack in his paternal grandfather; Heart disease in his paternal grandfather; Hepatitis in his paternal aunt; Hepatitis C in his paternal aunt; Hyperlipidemia in his father, maternal uncle, and paternal uncle; Hypertension in his maternal uncle, mother, and paternal uncle; Prostate cancer in his maternal grandfather. There is no history of Esophageal cancer, Stomach cancer, or Rectal cancer. ROS:   Please see the history of present illness.    All 14 point review of systems negative except as described per history of present illness  EKGs/Labs/Other Studies Reviewed:    EKG Interpretation Date/Time:  Monday September 23 2023 15:11:36 EST Ventricular Rate:  74 PR Interval:  164 QRS Duration:  96 QT Interval:  394 QTC Calculation: 437 R Axis:   18  Text Interpretation: Normal sinus rhythm Normal ECG When compared with ECG of 15-Aug-2023 16:19, No significant change was found Confirmed by Bernie Charleston [phone]) on 09/23/2023 3:15:04 PM    Recent Labs: 07/17/2023: ALT 29; BUN 26; Creatinine, Ser 0.99; Hemoglobin 12.9; Platelets 384.0; Potassium 3.6; Sodium 138  Recent Lipid Panel    Component Value Date/Time   CHOL 144 03/25/2023 1425   CHOL 169 04/10/2022 1007   TRIG 104.0 03/25/2023 1425   HDL 44.40 03/25/2023 1425   HDL 42 04/10/2022 1007   CHOLHDL 3 03/25/2023 1425   VLDL 20.8 03/25/2023 1425   LDLCALC 79 03/25/2023 1425   LDLCALC 100 (H) 04/10/2022 1007   LDLDIRECT 81.0 10/19/2019 0740    Physical Exam:    VS:  BP 106/76 (BP Location: Right Arm, Patient Position: Sitting)   Pulse 74   Ht 5' 10 (1.778 m)   Wt 206 lb 12.8 oz (93.8 kg)   SpO2 96%   BMI 29.67 kg/m     Wt Readings from Last 3 Encounters:  09/23/23 206 lb 12.8 oz (93.8 kg)  09/10/23 202 lb (91.6 kg)  08/15/23 199 lb (90.3 kg)     GEN:  Well nourished, well developed in no acute distress HEENT: Normal NECK: No JVD; No carotid bruits LYMPHATICS: No lymphadenopathy CARDIAC: RRR, no murmurs, no rubs, no gallops RESPIRATORY:  Clear to auscultation without rales, wheezing or rhonchi  ABDOMEN: Soft, non-tender, non-distended MUSCULOSKELETAL:  No edema; No deformity  SKIN: Warm and dry LOWER EXTREMITIES: no swelling NEUROLOGIC:  Alert and oriented x 3 PSYCHIATRIC:  Normal affect   ASSESSMENT:    1. Essential hypertension   2. Coronary artery calcification   3. Dyslipidemia    PLAN:    In order of problems listed above:  Elevated coronary calcium  score which was 200 3753 percentile.  I discussed significance of this finding I did use [place] calculator to calculate his predicted 10 years risk which is 10.4% is considered intermediate coronary age is 59.  Which is +19 to his chronological age.  Obviously that is concerning he is taking 40 mg of Lipitor will increase dose of Lipitor to 80 mg his last fasting lipid profile on Lipitor 40 show LDL of 79 HDL 44 we want LDL less than 70 hopefully will be  able to achieve this with higher dose of Lipitor. Essential hypertension  blood pressure well-controlled continue present management. Dyslipidemia plan as above. We did discuss need to exercise on the regular basis which he does he is did suffer from some knee injury and he is waiting for evaluation of it   Medication Adjustments/Labs and Tests Ordered: Current medicines are reviewed at length with the patient today.  Concerns regarding medicines are outlined above.  Orders Placed This Encounter  Procedures   EKG 12-Lead   Medication changes: No orders of the defined types were placed in this encounter.   Signed, Lamar DOROTHA Fitch, MD, [facility] 09/23/2023 3:29 PM    [facility]

## 2023-09-23 NOTE — Progress Notes (Signed)
 Ben Justeen Hehr D.[facility] [address] [place] [address] Phone: [phone]   Assessment and Plan:     1. Acute pain of right knee 2. 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  If still having pain after 2 weeks, complete 3rd-week of NSAID. May use remaining NSAID as needed once daily for pain control.  Do not to use additional over-the-counter NSAIDs (ibuprofen, naproxen , Advil, Aleve ) while taking prescription NSAIDs.  May use Tylenol  [phone] mg 2 to 3 times a day for breakthrough pain. - Start HEP for knee - Reviewed x-ray in clinic.  My interpretation: No acute fracture or dislocation.  No significant degenerative changes.  Chondrocalcinosis present  3. Neck pain 4. DDD (degenerative disc disease), cervical -Chronic with exacerbation, initial sports medicine visit - Most consistent with flare of cervical DDD primarily at C5-6 and C6-7 based on x-ray review  - Start meloxicam  15 mg daily x2 weeks.  If still having pain after 2 weeks, complete 3rd-week of NSAID. May use remaining NSAID as needed once daily for pain control.  Do not to use additional over-the-counter NSAIDs (ibuprofen, naproxen , Advil, Aleve ) while taking prescription NSAIDs.  May use Tylenol  [phone] mg 2 to 3 times a day for breakthrough pain. - Start HEP for neck  15 additional minutes spent for educating Therapeutic Home Exercise Program.  This included exercises focusing on stretching, strengthening, with focus on eccentric aspects.   Long term goals include an improvement in range of motion, strength, endurance as well as avoiding reinjury. Patient's frequency would include in 1-2 times a day, 3-5 times a week for a duration of 6-12 weeks. Proper technique shown and discussed handout in great  detail with ATC.  All questions were discussed and answered.    Pertinent previous records reviewed include C-spine x-ray and right knee x-ray 09/10/2023  Follow Up: 4 weeks for reevaluation.  If no improvement or worsening of symptoms, could consider physical therapy versus CSI versus advanced imaging   Subjective:   I, Moenique Parris, am serving as a neurosurgeon for Doctor Morene Mace  Chief Complaint: neck and right knee pain   HPI:   09/24/2023 Patient is a 53 year old male with neck and right knee pain. Patient states neck was hurting but has improved over the last few weeks. Still has decreased ROM when he looks up and to the left he gets numbness down the elbow. When he turns to the right the numbness goes away. That has been going on for about 3 weeks  Right knee was swollen, painful and hot to the touch. Pain has decreased because he has been resting. Decreased ROM. Pain with extension and rotation. Pain for about 3 weeks. Thinks its from [employer] dog he was playing around.  Ibu for the pain and that does not help. Antalgic gait. RICES for the knee helps.    Relevant Historical Information: Hypertension  Additional pertinent review of systems negative.   Current Outpatient Medications:    albuterol (PROVENTIL HFA;VENTOLIN HFA) 108 (90 Base) MCG/ACT inhaler, Inhale 1-2 puffs into the lungs every 6 (six) hours as needed for wheezing or shortness of breath. Ventolin, Disp: , Rfl:    amLODipine  (NORVASC ) 5 MG tablet, TAKE 1 TABLET (5 MG TOTAL) BY MOUTH DAILY., Disp: 90 tablet, Rfl: 0  atorvastatin  (LIPITOR) 80 MG tablet, Take 1 tablet (80 mg total) by mouth daily., Disp: 90 tablet, Rfl: 3   buPROPion (WELLBUTRIN XL) 150 MG 24 hr tablet, Take 150 mg by mouth every morning., Disp: , Rfl:    calcium  carbonate (TUMS - DOSED IN MG ELEMENTAL CALCIUM ) 500 MG chewable tablet, Chew 2 tablets by mouth daily as needed for indigestion or heartburn., Disp: , Rfl:    fenofibrate  micronized  (LOFIBRA) 67 MG capsule, TAKE 1 CAPSULE (67 MG TOTAL) BY MOUTH DAILY BEFORE BREAKFAST. (Patient taking differently: Take 67 mg by mouth daily before breakfast.), Disp: 90 capsule, Rfl: 1   fluticasone  (FLONASE ) 50 MCG/ACT nasal spray, Place 2 sprays into both nostrils daily., Disp: 48 mL, Rfl: 1   gabapentin  (NEURONTIN ) 300 MG capsule, Take 300 mg by mouth 3 (three) times daily., Disp: , Rfl:    ibuprofen (ADVIL,MOTRIN) 200 MG tablet, Take 400 mg by mouth daily as needed for headache or moderate pain., Disp: , Rfl:    ipratropium (ATROVENT ) 0.03 % nasal spray, PLACE 2 SPRAYS INTO BOTH NOSTRILS EVERY 12 (TWELVE) HOURS., Disp: 90 mL, Rfl: 1   loratadine (CLARITIN) 10 MG tablet, Take 10 mg by mouth daily as needed for allergies., Disp: , Rfl:    LORazepam  (ATIVAN ) 0.5 MG tablet, Take 1 tablet (0.5 mg total) by mouth 2 (two) times daily as needed for anxiety., Disp: 60 tablet, Rfl: 1   meloxicam  (MOBIC ) 15 MG tablet, Take 1 tablet (15 mg total) by mouth daily., Disp: 30 tablet, Rfl: 0   methylPREDNISolone  (MEDROL ) 4 MG tablet, Standard 6 day taper medrol  dose pack. (Patient taking differently: Take 4 mg by mouth See admin instructions. Standard 6 day taper medrol  dose pack.), Disp: 21 tablet, Rfl: 0   Multiple Vitamin (MULTIVITAMIN WITH MINERALS) TABS tablet, Take 1 tablet by mouth daily., Disp: , Rfl:    propranolol  (INDERAL ) 20 MG tablet, TAKE 1 TABLET BY MOUTH TWICE A DAY (Patient taking differently: Take 20 mg by mouth 2 (two) times daily.), Disp: 180 tablet, Rfl: 1   SUMAtriptan  (IMITREX ) 50 MG tablet, 1 TABLET DAILY , MAY REPEAT IN 2 HOURS IF HEADACHE PERSISTS OR RECURS. (Patient taking differently: Take 50 mg by mouth See admin instructions. 1 tablet daily , May repeat in 2 hours if headache persists or recurs.), Disp: 9 tablet, Rfl: 2   triamterene -hydrochlorothiazide (MAXZIDE-25) 37.5-25 MG tablet, TAKE 1 TABLET BY MOUTH EVERY DAY (Patient taking differently: Take 1 tablet by mouth daily.), Disp: 90  tablet, Rfl: 1   WIXELA INHUB 500-50 MCG/ACT AEPB, INHALE 2 PUFFS INTO THE LUNGS IN THE MORNING AND AT BEDTIME., Disp: 60 each, Rfl: 5   FLUoxetine  (PROZAC ) 40 MG capsule, Take 2 capsules (80 mg total) by mouth daily., Disp: 180 capsule, Rfl: 1   QUEtiapine  (SEROQUEL ) 50 MG tablet, Take 2 tablets (100 mg total) by mouth at bedtime., Disp: 180 tablet, Rfl: 1   zolpidem  (AMBIEN ) 10 MG tablet, Take 1 tablet (10 mg total) by mouth at bedtime as needed for sleep., Disp: 30 tablet, Rfl: 1   Objective:     Vitals:   09/24/23 1521  BP: 122/80  Pulse: 73  SpO2: 96%  Weight: 206 lb (93.4 kg)  Height: 5' 10 (1.778 m)      Body mass index is 29.56 kg/m.    Physical Exam:    Neck Exam: Cervical Spine- Posture normal Skin- normal, intact  Neuro:  Strength-  Right Left   Deltoid (C5) 5/5 5/5  Bicep/Brachioradialis (C5/6) 5/5  5/5  Wrist Extension (C6) 5/5 5/5  Tricep (C7) 5/5 5/5  Wrist Flexion (C7) 5/5 5/5  Grip (C8) 5/5 5/5  Finger Abduction (T1) 5/5 5/5   Sensation: intact to light touch in upper extremities bilaterally  Spurling's:  negative bilaterally Neck ROM: Reduced left sidebending and rotation, otherwise full active ROM   NTTP: cervical spinous processes, cervical paraspinal, thoracic paraspinal, trapezius   General:  awake, alert oriented, no acute distress nontoxic Skin: no suspicious lesions or rashes Neuro:sensation intact and strength 5/5 with no deficits, no atrophy, normal muscle tone Psych: No signs of anxiety, depression or other mood disorder  Right knee: No swelling No deformity Neg fluid wave, joint milking ROM Flex 110, Ext 0 TTP medial joint line, medial femoral condyle NTTP over the quad tendon,  , lat fem condyle, patella, plica, patella tendon, tibial tuberostiy, fibular head, posterior fossa, pes anserine bursa, gerdy's tubercle,  lateral jt line Neg anterior and posterior drawer Neg lachman Neg sag sign Negative varus stress Negative valgus  stress Negative McMurray Positive Thessaly  Gait normal   Electronically signed by:  Odis Mace D.[facility] 3:51 PM 09/24/23

## 2023-09-23 NOTE — Patient Instructions (Signed)
 Medication Instructions:   INCREASE: Lipitor to 80mg  daily- You may double your current dose and your next refill will reflect your new dose.   Lab Work: Your physician recommends that you return for lab work in: 6 weeks You need to have labs done when you are fasting.  You can come Monday through Friday 8:30 am to 12:00 pm and 1:15 to 4:30. You do not need to make an appointment as the order has already been placed. The labs you are going to have done are AST, ALT Lipids.    Testing/Procedures: None Ordered   Follow-Up: At [facility], you and your health needs are our priority.  As part of our continuing mission to provide you with exceptional heart care, we have created designated Provider Care Teams.  These Care Teams include your primary Cardiologist (physician) and Advanced Practice Providers (APPs -  Physician Assistants and Nurse Practitioners) who all work together to provide you with the care you need, when you need it.  We recommend signing up for the patient portal called MyChart.  Sign up information is provided on this After Visit Summary.  MyChart is used to connect with patients for Virtual Visits (Telemedicine).  Patients are able to view lab/test results, encounter notes, upcoming appointments, etc.  Non-urgent messages can be sent to your provider as well.   To learn more about what you can do with MyChart, go to [email or web address].    Your next appointment:   12 month(s)  The format for your next appointment:   In Person  Provider:   Lamar Fitch, MD    Other Instructions NA

## 2023-09-24 ENCOUNTER — Ambulatory Visit (INDEPENDENT_AMBULATORY_CARE_PROVIDER_SITE_OTHER): Payer: 59 | Admitting: Sports Medicine

## 2023-09-24 ENCOUNTER — Encounter: Payer: Self-pay | Admitting: Cardiology

## 2023-09-24 VITALS — BP 122/80 | HR 73 | Ht 70.0 in | Wt 206.0 lb

## 2023-09-24 DIAGNOSIS — M25561 Pain in right knee: Secondary | ICD-10-CM

## 2023-09-24 DIAGNOSIS — M542 Cervicalgia: Secondary | ICD-10-CM

## 2023-09-24 DIAGNOSIS — M11261 Other chondrocalcinosis, right knee: Secondary | ICD-10-CM | POA: Diagnosis not present

## 2023-09-24 DIAGNOSIS — M5030 Other cervical disc degeneration, unspecified cervical region: Secondary | ICD-10-CM

## 2023-09-24 MED ORDER — MELOXICAM 15 MG PO TABS: 15.0000 mg | ORAL_TABLET | Freq: Every day | ORAL | 0 refills | Status: DC

## 2023-09-24 NOTE — Patient Instructions (Signed)
 Knee and neck HEP  - Start meloxicam  15 mg daily x2 weeks.  If still having pain after 2 weeks, complete 3rd-week of NSAID. May use remaining NSAID as needed once daily for pain control.  Do not to use additional over-the-counter NSAIDs (ibuprofen, naproxen , Advil, Aleve ) while taking prescription NSAIDs.  May use Tylenol  [phone] mg 2 to 3 times a day for breakthrough pain. 4 week follow up

## 2023-10-08 NOTE — Telephone Encounter (Signed)
Dr. Francine Graven, Please see patient message regarding snoring and advise. Looks like his last HST was in 2021.  Thank you.

## 2023-10-11 ENCOUNTER — Encounter: Payer: Self-pay | Admitting: Sports Medicine

## 2023-10-11 ENCOUNTER — Encounter: Payer: Self-pay | Admitting: Medical

## 2023-10-21 NOTE — Progress Notes (Signed)
Aleen Sells D.[facility] [address] [place] [address] Phone: [phone]   Assessment and Plan:     1. Neck pain 2. DDD (degenerative disc disease), cervical -Chronic with exacerbation, subsequent visit - Overall no change in numbness and tingling with underlying degenerative changes primarily at C5-6 and C6-7 based on x-ray.  Suspect cervical radiculopathy due to degenerative changes at these levels - Recommend further evaluation with C-spine MRI due to failure to improve with >6 weeks of conservative therapy, bilateral radicular symptoms, pain at times >6/10, pain affecting day-to-day activities.  Order placed today - Start Tylenol 500 to 1000 mg tablets 2-3 times a day for day-to-day pain relief - Discontinue meloxicam 15 mg daily.  May use meloxicam 15 mg daily as needed for breakthrough pain.  Recommend limiting chronic NSAIDs to 1-2 doses per week - Continue HEP for neck  3. Acute pain of right knee 4. Pseudogout of right knee  -Chronic with exacerbation, subsequent visit - Significant improvement in flare of left knee pain consistent with resolved flare of pseudogout after completing course of meloxicam - Continue HEP for knee - Discontinue meloxicam 15 mg daily.  May use remainder as needed for breakthrough pain.  Recommend limiting chronic NSAIDs 1-2 doses per week  Pertinent previous records reviewed include none  Follow Up: 5 days after MRI to review results and discuss treatment plan.  Could order epidural CSI if appropriate based on MRI findings   Subjective:   I, George Ward, am serving as a Neurosurgeon for Doctor Richardean Sale   Chief Complaint: neck and right knee pain    HPI:    09/24/2023 Patient is a 53 year old male with neck and right knee pain. Patient states neck was hurting but has improved over the last few weeks. Still has decreased ROM when he looks up and to the left he gets numbness down the elbow. When  he turns to the right the numbness goes away. That has been going on for about 3 weeks   Right knee was swollen, painful and hot to the touch. Pain has decreased because he has been resting. Decreased ROM. Pain with extension and rotation. Pain for about 3 weeks. Thinks its from [employer] dog he was playing around.  Ibu for the pain and that does not help. Antalgic gait. RICES for the knee helps.    10/22/2023 Patient states knee is better. Neck is still causing some numbness   Relevant Historical Information: Hypertension    Additional pertinent review of systems negative.   Current Outpatient Medications:    albuterol (PROVENTIL HFA;VENTOLIN HFA) 108 (90 Base) MCG/ACT inhaler, Inhale 1-2 puffs into the lungs every 6 (six) hours as needed for wheezing or shortness of breath. Ventolin, Disp: , Rfl:    amLODipine (NORVASC) 5 MG tablet, TAKE 1 TABLET (5 MG TOTAL) BY MOUTH DAILY., Disp: 90 tablet, Rfl: 0   atorvastatin (LIPITOR) 80 MG tablet, Take 1 tablet (80 mg total) by mouth daily., Disp: 90 tablet, Rfl: 3   buPROPion (WELLBUTRIN XL) 150 MG 24 hr tablet, Take 150 mg by mouth every morning., Disp: , Rfl:    calcium carbonate (TUMS - DOSED IN MG ELEMENTAL CALCIUM) 500 MG chewable tablet, Chew 2 tablets by mouth daily as needed for indigestion or heartburn., Disp: , Rfl:    fenofibrate micronized (LOFIBRA) 67 MG capsule, TAKE 1 CAPSULE (67 MG TOTAL) BY MOUTH DAILY BEFORE BREAKFAST. (Patient taking differently: Take 67 mg by  mouth daily before breakfast.), Disp: 90 capsule, Rfl: 1   fluticasone (FLONASE) 50 MCG/ACT nasal spray, Place 2 sprays into both nostrils daily., Disp: 48 mL, Rfl: 1   gabapentin (NEURONTIN) 300 MG capsule, Take 300 mg by mouth 3 (three) times daily., Disp: , Rfl:    ibuprofen (ADVIL,MOTRIN) 200 MG tablet, Take 400 mg by mouth daily as needed for headache or moderate pain., Disp: , Rfl:    ipratropium (ATROVENT) 0.03 % nasal spray, PLACE 2 SPRAYS INTO BOTH NOSTRILS EVERY 12  (TWELVE) HOURS., Disp: 90 mL, Rfl: 1   loratadine (CLARITIN) 10 MG tablet, Take 10 mg by mouth daily as needed for allergies., Disp: , Rfl:    LORazepam (ATIVAN) 0.5 MG tablet, Take 1 tablet (0.5 mg total) by mouth 2 (two) times daily as needed for anxiety., Disp: 60 tablet, Rfl: 1   meloxicam (MOBIC) 15 MG tablet, Take 1 tablet (15 mg total) by mouth daily., Disp: 30 tablet, Rfl: 0   methylPREDNISolone (MEDROL) 4 MG tablet, Standard 6 day taper medrol dose pack. (Patient taking differently: Take 4 mg by mouth See admin instructions. Standard 6 day taper medrol dose pack.), Disp: 21 tablet, Rfl: 0   Multiple Vitamin (MULTIVITAMIN WITH MINERALS) TABS tablet, Take 1 tablet by mouth daily., Disp: , Rfl:    propranolol (INDERAL) 20 MG tablet, TAKE 1 TABLET BY MOUTH TWICE A DAY (Patient taking differently: Take 20 mg by mouth 2 (two) times daily.), Disp: 180 tablet, Rfl: 1   SUMAtriptan (IMITREX) 50 MG tablet, 1 TABLET DAILY , MAY REPEAT IN 2 HOURS IF HEADACHE PERSISTS OR RECURS. (Patient taking differently: Take 50 mg by mouth See admin instructions. 1 tablet daily , May repeat in 2 hours if headache persists or recurs.), Disp: 9 tablet, Rfl: 2   triamterene-hydrochlorothiazide (MAXZIDE-25) 37.5-25 MG tablet, TAKE 1 TABLET BY MOUTH EVERY DAY (Patient taking differently: Take 1 tablet by mouth daily.), Disp: 90 tablet, Rfl: 1   WIXELA INHUB 500-50 MCG/ACT AEPB, INHALE 2 PUFFS INTO THE LUNGS IN THE MORNING AND AT BEDTIME., Disp: 60 each, Rfl: 5   FLUoxetine (PROZAC) 40 MG capsule, Take 2 capsules (80 mg total) by mouth daily., Disp: 180 capsule, Rfl: 1   QUEtiapine (SEROQUEL) 50 MG tablet, Take 2 tablets (100 mg total) by mouth at bedtime., Disp: 180 tablet, Rfl: 1   zolpidem (AMBIEN) 10 MG tablet, Take 1 tablet (10 mg total) by mouth at bedtime as needed for sleep., Disp: 30 tablet, Rfl: 1   Objective:     Vitals:   10/22/23 1525  BP: 128/72  Pulse: 78  SpO2: 97%  Weight: 204 lb (92.5 kg)  Height:  5\' 10"  (1.778 m)      Body mass index is 29.27 kg/m.    Physical Exam:    Neck Exam: Cervical Spine- Posture normal Skin- normal, intact   Neuro:  Strength-   Right Left  Deltoid (C5) 5/5 5/5 Bicep/Brachioradialis (C5/6) 5/5  5/5 Wrist Extension (C6) 5/5 5/5 Tricep (C7) 5/5 5/5 Wrist Flexion (C7) 5/5 5/5 Grip (C8) 5/5 5/5 Finger Abduction (T1) 5/5 5/5   Sensation: intact to light touch in upper extremities bilaterally   Spurling's:  negative bilaterally Neck ROM: Reduced left sidebending and rotation, otherwise full active ROM   NTTP: cervical spinous processes, cervical paraspinal, thoracic paraspinal, trapezius     Electronically signed by:  Aleen Sells D.[facility] 3:37 PM 10/22/23

## 2023-10-22 ENCOUNTER — Ambulatory Visit: Payer: 59 | Admitting: Sports Medicine

## 2023-10-22 VITALS — BP 128/72 | HR 78 | Ht 70.0 in | Wt 204.0 lb

## 2023-10-22 DIAGNOSIS — M25561 Pain in right knee: Secondary | ICD-10-CM

## 2023-10-22 DIAGNOSIS — M5030 Other cervical disc degeneration, unspecified cervical region: Secondary | ICD-10-CM | POA: Diagnosis not present

## 2023-10-22 DIAGNOSIS — M542 Cervicalgia: Secondary | ICD-10-CM

## 2023-10-22 DIAGNOSIS — M11261 Other chondrocalcinosis, right knee: Secondary | ICD-10-CM

## 2023-10-22 NOTE — Patient Instructions (Addendum)
MRI referral  Follow up 5 days after  Discontinue daily meloxicam and use remainder as needed limit to 1-2 times per week  Tylenol [phone] mg 2-3 times a day for pain relief

## 2023-10-29 ENCOUNTER — Ambulatory Visit (INDEPENDENT_AMBULATORY_CARE_PROVIDER_SITE_OTHER): Payer: 59

## 2023-10-29 DIAGNOSIS — M4804 Spinal stenosis, thoracic region: Secondary | ICD-10-CM | POA: Diagnosis not present

## 2023-10-29 DIAGNOSIS — M47812 Spondylosis without myelopathy or radiculopathy, cervical region: Secondary | ICD-10-CM

## 2023-10-29 DIAGNOSIS — M11261 Other chondrocalcinosis, right knee: Secondary | ICD-10-CM

## 2023-10-29 DIAGNOSIS — M25561 Pain in right knee: Secondary | ICD-10-CM

## 2023-10-29 DIAGNOSIS — M4802 Spinal stenosis, cervical region: Secondary | ICD-10-CM

## 2023-10-29 DIAGNOSIS — M5030 Other cervical disc degeneration, unspecified cervical region: Secondary | ICD-10-CM

## 2023-10-29 DIAGNOSIS — M542 Cervicalgia: Secondary | ICD-10-CM

## 2023-11-01 NOTE — Progress Notes (Signed)
 Aleen Sells D.[facility] [address] [place] [address] Phone: [phone]   Assessment and Plan:     1. Neck pain 2. DDD (degenerative disc disease), cervical 3. 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  We discussed multiple areas of degenerative changes and neuroforaminal stenosis most severe at bilateral C3-C4, C4-C5, right sided C5-C6, right sided T1-T2 - We discussed that we cannot perform epidural CSI above C6 level, so any epidural CSI would have to be performed at a level lower than patient's most significant pathology and response could therefore be suboptimal - Patient elected to discuss MRI and treatment options further with neurosurgery.  Referral placed today - Start Tylenol 500 to 1000 mg tablets 2-3 times a day for day-to-day pain relief - Continue meloxicam 15 mg daily as needed for breakthrough pain.  Recommend limiting chronic NSAIDs to 1-2 doses per week - Increase to gabapentin 600 mg 3 times daily.  Refill provided - Continue HEP for neck  Pertinent previous records reviewed include C-spine MRI 10/29/2023  Follow Up: As needed if no improvement or worsening of symptoms after establishing with neurosurgery   Subjective:   I, Jerene Canny, am serving as a Neurosurgeon for Doctor Richardean Sale   Chief Complaint: neck and right knee pain    HPI:    09/24/2023 Patient is a 53 year old male with neck and right knee pain. Patient states neck was hurting but has improved over the last few weeks. Still has decreased ROM when he looks up and to the left he gets numbness down the elbow. When he turns to the right the numbness goes away. That has been going on for about 3 weeks    Right knee was swollen, painful and hot to the touch. Pain has decreased because he has been resting. Decreased ROM. Pain with extension and rotation. Pain for about 3 weeks. Thinks its from [place] [employer] dog he was playing around.  Ibu for the pain and that does not help. Antalgic gait. RICES for the knee helps.     10/22/2023 Patient states knee is better. Neck is still causing some numbness  11/04/2023 Patient states he is the same    Relevant Historical Information: Hypertension    Additional pertinent review of systems negative.   Current Outpatient Medications:    albuterol (PROVENTIL HFA;VENTOLIN HFA) 108 (90 Base) MCG/ACT inhaler, Inhale 1-2 puffs into the lungs every 6 (six) hours as needed for wheezing or shortness of breath. Ventolin, Disp: , Rfl:    amLODipine (NORVASC) 5 MG tablet, TAKE 1 TABLET (5 MG TOTAL) BY MOUTH DAILY., Disp: 90 tablet, Rfl: 0   atorvastatin (LIPITOR) 80 MG tablet, Take 1 tablet (80 mg total) by mouth daily., Disp: 90 tablet, Rfl: 3   buPROPion (WELLBUTRIN XL) 150 MG 24 hr tablet, Take 150 mg by mouth every morning., Disp: , Rfl:    calcium carbonate (TUMS - DOSED IN MG ELEMENTAL CALCIUM) 500 MG chewable tablet, Chew 2 tablets by mouth daily as needed for indigestion or heartburn., Disp: , Rfl:    fenofibrate micronized (LOFIBRA) 67 MG capsule, TAKE 1 CAPSULE (67 MG TOTAL) BY MOUTH DAILY BEFORE BREAKFAST. (Patient taking differently: Take 67 mg by mouth daily before breakfast.), Disp: 90 capsule, Rfl: 1  fluticasone (FLONASE) 50 MCG/ACT nasal spray, Place 2 sprays into both nostrils daily., Disp: 48 mL, Rfl: 1   ibuprofen (ADVIL,MOTRIN) 200 MG tablet, Take 400 mg by mouth daily as needed for headache or moderate pain., Disp: , Rfl:    ipratropium (ATROVENT) 0.03 % nasal spray, PLACE 2 SPRAYS INTO BOTH NOSTRILS EVERY 12 (TWELVE) HOURS., Disp: 90 mL, Rfl: 1   loratadine (CLARITIN) 10 MG tablet, Take 10 mg by mouth daily as needed for allergies., Disp: , Rfl:     LORazepam (ATIVAN) 0.5 MG tablet, Take 1 tablet (0.5 mg total) by mouth 2 (two) times daily as needed for anxiety., Disp: 60 tablet, Rfl: 1   meloxicam (MOBIC) 15 MG tablet, Take 1 tablet (15 mg total) by mouth daily., Disp: 30 tablet, Rfl: 0   methylPREDNISolone (MEDROL) 4 MG tablet, Standard 6 day taper medrol dose pack. (Patient taking differently: Take 4 mg by mouth See admin instructions. Standard 6 day taper medrol dose pack.), Disp: 21 tablet, Rfl: 0   Multiple Vitamin (MULTIVITAMIN WITH MINERALS) TABS tablet, Take 1 tablet by mouth daily., Disp: , Rfl:    propranolol (INDERAL) 20 MG tablet, TAKE 1 TABLET BY MOUTH TWICE A DAY (Patient taking differently: Take 20 mg by mouth 2 (two) times daily.), Disp: 180 tablet, Rfl: 1   SUMAtriptan (IMITREX) 50 MG tablet, 1 TABLET DAILY , MAY REPEAT IN 2 HOURS IF HEADACHE PERSISTS OR RECURS. (Patient taking differently: Take 50 mg by mouth See admin instructions. 1 tablet daily , May repeat in 2 hours if headache persists or recurs.), Disp: 9 tablet, Rfl: 2   triamterene-hydrochlorothiazide (MAXZIDE-25) 37.5-25 MG tablet, TAKE 1 TABLET BY MOUTH EVERY DAY (Patient taking differently: Take 1 tablet by mouth daily.), Disp: 90 tablet, Rfl: 1   WIXELA INHUB 500-50 MCG/ACT AEPB, INHALE 2 PUFFS INTO THE LUNGS IN THE MORNING AND AT BEDTIME., Disp: 60 each, Rfl: 5   FLUoxetine (PROZAC) 40 MG capsule, Take 2 capsules (80 mg total) by mouth daily., Disp: 180 capsule, Rfl: 1   gabapentin (NEURONTIN) 300 MG capsule, Take 1 capsule (300 mg total) by mouth 3 (three) times daily., Disp: 120 capsule, Rfl: 2   QUEtiapine (SEROQUEL) 50 MG tablet, Take 2 tablets (100 mg total) by mouth at bedtime., Disp: 180 tablet, Rfl: 1   zolpidem (AMBIEN) 10 MG tablet, Take 1 tablet (10 mg total) by mouth at bedtime as needed for sleep., Disp: 30 tablet, Rfl: 1   Objective:     Vitals:   11/04/23 1453  BP: 132/84  Pulse: 82  SpO2: 96%  Weight: 207 lb (93.9 kg)  Height: 5\' 10"  (1.778  m)      Body mass index is 29.7 kg/m.    Physical Exam:    Neck Exam: Cervical Spine- Posture normal Skin- normal, intact   Neuro:  Strength-   Right Left  Deltoid (C5) 5/5 5/5 Bicep/Brachioradialis (C5/6) 5/5  5/5 Wrist Extension (C6) 5/5 5/5 Tricep (C7) 5/5 5/5 Wrist Flexion (C7) 5/5 5/5 Grip (C8) 5/5 5/5 Finger Abduction (T1) 5/5 5/5   Sensation: intact to light touch in upper extremities bilaterally   Spurling's:  negative bilaterally Neck ROM: Reduced left sidebending and rotation, otherwise full active ROM   NTTP: cervical spinous processes, cervical paraspinal, thoracic paraspinal, trapezius     Electronically signed by:  Aleen Sells [facility] 4:42 PM 11/04/23

## 2023-11-04 ENCOUNTER — Ambulatory Visit: Payer: 59 | Admitting: Sports Medicine

## 2023-11-04 VITALS — BP 132/84 | HR 82 | Ht 70.0 in | Wt 207.0 lb

## 2023-11-04 DIAGNOSIS — M542 Cervicalgia: Secondary | ICD-10-CM

## 2023-11-04 DIAGNOSIS — M5030 Other cervical disc degeneration, unspecified cervical region: Secondary | ICD-10-CM

## 2023-11-04 DIAGNOSIS — M4802 Spinal stenosis, cervical region: Secondary | ICD-10-CM | POA: Diagnosis not present

## 2023-11-04 MED ORDER — GABAPENTIN 300 MG PO CAPS: 300.0000 mg | ORAL_CAPSULE | Freq: Three times a day (TID) | ORAL | 2 refills | Status: DC

## 2023-11-04 NOTE — Patient Instructions (Signed)
 Neurosurgery referral  Tylenol [phone] mg 2-3 times a day for pain relief  NSAIDS as needed for breakthrough pain limit to 1-2 times per week  As needed follow up  Increase gabapentin to 600 mg 3x times a day

## 2023-11-05 ENCOUNTER — Ambulatory Visit: Payer: 59 | Admitting: Medical

## 2023-11-05 VITALS — BP 128/88 | HR 80 | Resp 18 | Ht 70.0 in | Wt 200.4 lb

## 2023-11-05 DIAGNOSIS — G4700 Insomnia, unspecified: Secondary | ICD-10-CM

## 2023-11-05 DIAGNOSIS — E663 Overweight: Secondary | ICD-10-CM

## 2023-11-05 DIAGNOSIS — R0683 Snoring: Secondary | ICD-10-CM

## 2023-11-05 NOTE — Patient Instructions (Signed)
 Snoring Variable snoring reported by patient's sleep app and confirmed by spouse. Negative sleep study for sleep apnea 2 years ago. No symptoms of sleep apnea reported. Patient has gained weight since last sleep study. -Encouraged weight loss of 10-15 pounds through balanced diet and regular exercise. -Advised to use Flonase during allergy season to prevent nasal congestion which may exacerbate snoring. -if wt loss successful and still snoring refer for repeat eval/sleep study  Insomnia -Continue Ambien as prescribed. Rx advisement given  Follow-up in 2-3 months to reassess symptoms and weight loss progress.

## 2023-11-05 NOTE — Progress Notes (Signed)
 Subjective:    Patient ID: George Ward, male    DOB: [birth year], 53 y.o.   MRN: [id number]  HPI  George Ward is a 53 year old male who presents with concerns about snoring and sleep quality.  He experiences variable snoring, with some nights showing minimal snoring and others up to 300 snores per hour, as recorded by a sleep app and smartwatch. His wife also reports frequent snoring. He typically sleeps on his side, alternating between right and left, and experiences occasional tossing and turning due to neck issues. No waking up out of breath, but there is variability in feeling rested upon waking, with more disturbances when sleeping upstairs due to his wife's interventions.  He underwent a sleep study over two years ago, which did not indicate sleep apnea. Despite this, he continues to experience snoring. He has been using Ambien nightly for several years to aid sleep. He notes variability in sleep quality and occasional waking during the night.  He has experienced a recent chest cold, which temporarily reduced his physical activity. He has resumed walking, aiming for 10,000 to 15,000 steps daily. He attributes past weight loss success to dietary changes, such as avoiding late-night eating and consuming balanced meals throughout the day.      Review of Systems  Constitutional:  Negative for fatigue and fever.  Respiratory:  Negative for cough, chest tightness, shortness of breath and wheezing.   Cardiovascular:  Negative for chest pain and palpitations.  Gastrointestinal:  Negative for abdominal pain, blood in stool, diarrhea and nausea.  Genitourinary:  Negative for dysuria.  Musculoskeletal:  Negative for back pain and joint swelling.  Skin:  Negative for rash.  Neurological:  Negative for dizziness, speech difficulty, weakness and light-headedness.  Hematological:  Negative for adenopathy. Does not bruise/bleed easily.  Psychiatric/Behavioral:  Negative for behavioral  problems, decreased concentration and sleep disturbance. The patient is not nervous/anxious.     Past Medical History:  Diagnosis Date   Alcohol abuse    Remission >20 years ago   Allergy    Anxiety    Asthma    Cancer (HCC)    Depression    MAJOR DEPRESSIVE DISORDER   Depression with anxiety    Drug abuse (HCC)    Remission >20 years ago   Environmental allergies    History of chicken pox    Hyperlipidemia    Hypertension    Hypoalphalipoproteinemia, familial    Lumbar pain    Migraines    Primary generalized (osteo)arthritis    Seasonal allergies      Social History   Socioeconomic History   Marital status: Married    Spouse name: Not on file   Number of children: Not on file   Years of education: Not on file   Highest education level: Master's degree (e.g., MA, MS, MEng, MEd, MSW, MBA)  Occupational History   Not on file  Tobacco Use   Smoking status: Former    Current packs/day: 0.00    Types: Cigarettes    Quit date: 03/07/1988    Years since quitting: 35.6   Smokeless tobacco: Former  Building services engineer status: Never Used  Substance and Sexual Activity   Alcohol use: Not Currently    Comment: 1/month   Drug use: Not Currently   Sexual activity: Yes  Other Topics Concern   Not on file  Social History Narrative   Not on file   Social Drivers of Health   Financial  Resource Strain: Low Risk  (09/08/2023)   Overall Financial Resource Strain (CARDIA)    Difficulty of Paying Living Expenses: Not hard at all  Food Insecurity: No Food Insecurity (09/08/2023)   Hunger Vital Sign    Worried About Running Out of Food in the Last Year: Never true    Ran Out of Food in the Last Year: Never true  Transportation Needs: No Transportation Needs (09/08/2023)   PRAPARE - Administrator, Civil Service (Medical): No    Lack of Transportation (Non-Medical): No  Physical Activity: Sufficiently Active (09/08/2023)   Exercise Vital Sign    Days of  Exercise per Week: 5 days    Minutes of Exercise per Session: 120 min  Stress: Stress Concern Present (09/08/2023)   [employer] of Occupational Health - Occupational Stress Questionnaire    Feeling of Stress : To some extent  Social Connections: Moderately Isolated (09/08/2023)   Social Connection and Isolation Panel [NHANES]    Frequency of Communication with Friends and Family: Three times a week    Frequency of Social Gatherings with Friends and Family: Once a week    Attends Religious Services: Never    Database administrator or Organizations: No    Attends Engineer, structural: Not on file    Marital Status: Married  Catering manager Violence: Not on file    Past Surgical History:  Procedure Laterality Date   EXCISION MASS HEAD  03/17/2018   excision cells on head  ; still in testing for cancer per patient report    LUMBAR EPIDURAL INJECTION     SHOULDER ARTHROSCOPY Left 2021   UMBILICAL HERNIA REPAIR N/A 04/02/2018   Procedure: OPEN UMBILICAL HERNIA REPAIR WITH INSERTION OF MESH;  Surgeon: Andria Meuse, MD;  Location: [facility];  Service: General;  Laterality: N/A;   WISDOM TOOTH EXTRACTION  1989-1990    Family History  Problem Relation Age of Onset   Hypertension Mother    Hyperlipidemia Father    Healthy Sister    Hypertension Maternal Uncle    Hyperlipidemia Maternal Uncle    Hepatitis Paternal Aunt    Hepatitis C Paternal Aunt    Hypertension Paternal Uncle    Hyperlipidemia Paternal Uncle    Colon cancer Maternal Grandfather    Prostate cancer Maternal Grandfather    Congestive Heart Failure Maternal Grandfather    Dementia Paternal Grandmother    Heart disease Paternal Grandfather    Heart attack Paternal Grandfather    Healthy Daughter        x4   Healthy Son        x1   Esophageal cancer Neg Hx    Stomach cancer Neg Hx    Rectal cancer Neg Hx     Allergies  Allergen Reactions   Co-Trimoxazole [Sulfamethoxazole-Trimethoprim]  Nausea And Vomiting   Lisinopril Cough    With Wheezing    Sulfa Antibiotics Nausea And Vomiting    Current Outpatient Medications on File Prior to Visit  Medication Sig Dispense Refill   albuterol (PROVENTIL HFA;VENTOLIN HFA) 108 (90 Base) MCG/ACT inhaler Inhale 1-2 puffs into the lungs every 6 (six) hours as needed for wheezing or shortness of breath. Ventolin     amLODipine (NORVASC) 5 MG tablet TAKE 1 TABLET (5 MG TOTAL) BY MOUTH DAILY. 90 tablet 0   atorvastatin (LIPITOR) 80 MG tablet Take 1 tablet (80 mg total) by mouth daily. 90 tablet 3   buPROPion (WELLBUTRIN XL) 150 MG 24 hr  tablet Take 150 mg by mouth every morning.     calcium carbonate (TUMS - DOSED IN MG ELEMENTAL CALCIUM) 500 MG chewable tablet Chew 2 tablets by mouth daily as needed for indigestion or heartburn.     fenofibrate micronized (LOFIBRA) 67 MG capsule TAKE 1 CAPSULE (67 MG TOTAL) BY MOUTH DAILY BEFORE BREAKFAST. (Patient taking differently: Take 67 mg by mouth daily before breakfast.) 90 capsule 1   FLUoxetine (PROZAC) 40 MG capsule Take 2 capsules (80 mg total) by mouth daily. 180 capsule 1   fluticasone (FLONASE) 50 MCG/ACT nasal spray Place 2 sprays into both nostrils daily. 48 mL 1   gabapentin (NEURONTIN) 300 MG capsule Take 1 capsule (300 mg total) by mouth 3 (three) times daily. 120 capsule 2   ibuprofen (ADVIL,MOTRIN) 200 MG tablet Take 400 mg by mouth daily as needed for headache or moderate pain.     ipratropium (ATROVENT) 0.03 % nasal spray PLACE 2 SPRAYS INTO BOTH NOSTRILS EVERY 12 (TWELVE) HOURS. 90 mL 1   loratadine (CLARITIN) 10 MG tablet Take 10 mg by mouth daily as needed for allergies.     LORazepam (ATIVAN) 0.5 MG tablet Take 1 tablet (0.5 mg total) by mouth 2 (two) times daily as needed for anxiety. 60 tablet 1   meloxicam (MOBIC) 15 MG tablet Take 1 tablet (15 mg total) by mouth daily. 30 tablet 0   methylPREDNISolone (MEDROL) 4 MG tablet Standard 6 day taper medrol dose pack. (Patient taking  differently: Take 4 mg by mouth See admin instructions. Standard 6 day taper medrol dose pack.) 21 tablet 0   Multiple Vitamin (MULTIVITAMIN WITH MINERALS) TABS tablet Take 1 tablet by mouth daily.     propranolol (INDERAL) 20 MG tablet TAKE 1 TABLET BY MOUTH TWICE A DAY (Patient taking differently: Take 20 mg by mouth 2 (two) times daily.) 180 tablet 1   QUEtiapine (SEROQUEL) 50 MG tablet Take 2 tablets (100 mg total) by mouth at bedtime. 180 tablet 1   SUMAtriptan (IMITREX) 50 MG tablet 1 TABLET DAILY , MAY REPEAT IN 2 HOURS IF HEADACHE PERSISTS OR RECURS. (Patient taking differently: Take 50 mg by mouth See admin instructions. 1 tablet daily , May repeat in 2 hours if headache persists or recurs.) 9 tablet 2   triamterene-hydrochlorothiazide (MAXZIDE-25) 37.5-25 MG tablet TAKE 1 TABLET BY MOUTH EVERY DAY (Patient taking differently: Take 1 tablet by mouth daily.) 90 tablet 1   WIXELA INHUB 500-50 MCG/ACT AEPB INHALE 2 PUFFS INTO THE LUNGS IN THE MORNING AND AT BEDTIME. 60 each 5   zolpidem (AMBIEN) 10 MG tablet Take 1 tablet (10 mg total) by mouth at bedtime as needed for sleep. 30 tablet 1   No current facility-administered medications on file prior to visit.    BP 128/88   Pulse 80   Resp 18   Ht 5\' 10"  (1.778 m)   Wt 200 lb 6.4 oz (90.9 kg)   SpO2 99%   BMI 28.75 kg/m        Objective:   Physical Exam  General Mental Status- Alert. General Appearance- Not in acute distress.   Skin General: Color- Normal Color. Moisture- Normal Moisture.  Neck Carotid Arteries- Normal color. Moisture- Normal Moisture. No carotid bruits. No JVD.  Chest and Lung Exam Auscultation: Breath Sounds:-CTA  Cardiovascular Auscultation:Rythm- RRR Murmurs & Other Heart Sounds:Auscultation of the heart reveals- No Murmurs.  Abdomen Inspection:-Inspeection Normal. Palpation/Percussion:Note:No mass. Palpation and Percussion of the abdomen reveal- Non Tender, Non Distended + BS,  no rebound or  guarding.   Neurologic Cranial Nerve exam:- CN III-XII intact(No nystagmus), symmetric smile. Strength:- 5/5 equal and symmetric strength both upper and lower extremities.       Assessment & Plan:   Patient Instructions  Snoring Variable snoring reported by patient's sleep app and confirmed by spouse. Negative sleep study for sleep apnea 2 years ago. No symptoms of sleep apnea reported. Patient has gained weight since last sleep study. -Encouraged weight loss of 10-15 pounds through balanced diet and regular exercise. -Advised to use Flonase during allergy season to prevent nasal congestion which may exacerbate snoring. -if wt loss successful and still snoring refer for repeat eval/sleep study  Insomnia -Continue Ambien as prescribed. Rx advisement given  Follow-up in 2-3 months to reassess symptoms and weight loss progress.

## 2023-11-08 NOTE — Progress Notes (Signed)
 Referring Physician:  Esperanza Richters, PA-C [id number] [place],  [place] [address]  Primary Physician:  Esperanza Richters, PA-C  History of Present Illness: 11/11/2023 George Ward is here today with a chief complaint of neck pain.  He states that when he turns his head a certain way he has shooting pain as well as numbness and tingling that radiates down to his left elbow.  He notices that his right upper extremity the neck pain radiates down to his first 2 digits.  This has been ongoing for 3 months when he fell while looking after a large dog.  He feels as though the numbness in the range of his pain is larger.  He was given a dose of steroids as well as Tylenol, Octagam, gabapentin, and using home exercises however has continued with pain.  Working at the computer and sleeping makes it worse.  He also has quite a bit of midline neck pain.  He denies any weakness in his upper extremities.  He has had no issues with fine motor skills or dropping things.  He also denies any changes to his gait or issues with walking.  He has never had any injections in his cervical spine.    Conservative measures:  Physical therapy:  has not participated in PT Multimodal medical therapy including regular antiinflammatories:  Gabapentin, Ibuprofen, Meloxicam, Medrol Injections: no epidural steroid injections  Past Surgery: none  George Ward has no symptoms of cervical myelopathy.  The symptoms are causing a significant impact on the patient's life.   Review of Systems:  A 10 point review of systems is negative, except for the pertinent positives and negatives detailed in the HPI.  Past Medical History: Past Medical History:  Diagnosis Date   Alcohol abuse    Remission >20 years ago   Allergy    Anxiety    Asthma    Cancer (HCC)    Depression    MAJOR DEPRESSIVE DISORDER   Depression with anxiety    Drug abuse (HCC)    Remission >20 years ago   Environmental allergies     History of chicken pox    Hyperlipidemia    Hypertension    Hypoalphalipoproteinemia, familial    Lumbar pain    Migraines    Primary generalized (osteo)arthritis    Seasonal allergies     Past Surgical History: Past Surgical History:  Procedure Laterality Date   EXCISION MASS HEAD  03/17/2018   excision cells on head  ; still in testing for cancer per patient report    LUMBAR EPIDURAL INJECTION     SHOULDER ARTHROSCOPY Left 2021   UMBILICAL HERNIA REPAIR N/A 04/02/2018   Procedure: OPEN UMBILICAL HERNIA REPAIR WITH INSERTION OF MESH;  Surgeon: Andria Meuse, MD;  Location: [facility];  Service: General;  Laterality: N/A;   WISDOM TOOTH EXTRACTION  1989-1990    Allergies: Allergies as of 11/11/2023 - Review Complete 11/11/2023  Allergen Reaction Noted   Co-trimoxazole [sulfamethoxazole-trimethoprim] Nausea And Vomiting 11/09/2016   Lisinopril Cough 11/07/2016   Sulfa antibiotics Nausea And Vomiting 04/02/2018    Medications: Outpatient Encounter Medications as of 11/11/2023  Medication Sig   albuterol (PROVENTIL HFA;VENTOLIN HFA) 108 (90 Base) MCG/ACT inhaler Inhale 1-2 puffs into the lungs every 6 (six) hours as needed for wheezing or shortness of breath. Ventolin   amLODipine (NORVASC) 5 MG tablet TAKE 1 TABLET (5 MG TOTAL) BY MOUTH DAILY.   atorvastatin (LIPITOR) 80 MG tablet Take  1 tablet (80 mg total) by mouth daily.   buPROPion (WELLBUTRIN XL) 150 MG 24 hr tablet Take 150 mg by mouth every morning.   calcium carbonate (TUMS - DOSED IN MG ELEMENTAL CALCIUM) 500 MG chewable tablet Chew 2 tablets by mouth daily as needed for indigestion or heartburn.   fenofibrate micronized (LOFIBRA) 67 MG capsule TAKE 1 CAPSULE (67 MG TOTAL) BY MOUTH DAILY BEFORE BREAKFAST. (Patient taking differently: Take 67 mg by mouth daily before breakfast.)   fluticasone (FLONASE) 50 MCG/ACT nasal spray Place 2 sprays into both nostrils daily.   gabapentin (NEURONTIN) 300 MG capsule Take 1  capsule (300 mg total) by mouth 3 (three) times daily.   loratadine (CLARITIN) 10 MG tablet Take 10 mg by mouth daily as needed for allergies.   LORazepam (ATIVAN) 0.5 MG tablet Take 1 tablet (0.5 mg total) by mouth 2 (two) times daily as needed for anxiety.   Multiple Vitamin (MULTIVITAMIN WITH MINERALS) TABS tablet Take 1 tablet by mouth daily.   propranolol (INDERAL) 20 MG tablet TAKE 1 TABLET BY MOUTH TWICE A DAY (Patient taking differently: Take 20 mg by mouth 2 (two) times daily.)   SUMAtriptan (IMITREX) 50 MG tablet 1 TABLET DAILY , MAY REPEAT IN 2 HOURS IF HEADACHE PERSISTS OR RECURS. (Patient taking differently: Take 50 mg by mouth See admin instructions. 1 tablet daily , May repeat in 2 hours if headache persists or recurs.)   triamterene-hydrochlorothiazide (MAXZIDE-25) 37.5-25 MG tablet TAKE 1 TABLET BY MOUTH EVERY DAY (Patient taking differently: Take 1 tablet by mouth daily.)   WIXELA INHUB 500-50 MCG/ACT AEPB INHALE 2 PUFFS INTO THE LUNGS IN THE MORNING AND AT BEDTIME.   [DISCONTINUED] ipratropium (ATROVENT) 0.03 % nasal spray PLACE 2 SPRAYS INTO BOTH NOSTRILS EVERY 12 (TWELVE) HOURS.   FLUoxetine (PROZAC) 40 MG capsule Take 2 capsules (80 mg total) by mouth daily.   QUEtiapine (SEROQUEL) 50 MG tablet Take 2 tablets (100 mg total) by mouth at bedtime.   zolpidem (AMBIEN) 10 MG tablet Take 1 tablet (10 mg total) by mouth at bedtime as needed for sleep.   [DISCONTINUED] ibuprofen (ADVIL,MOTRIN) 200 MG tablet Take 400 mg by mouth daily as needed for headache or moderate pain.   [DISCONTINUED] meloxicam (MOBIC) 15 MG tablet Take 1 tablet (15 mg total) by mouth daily.   [DISCONTINUED] methylPREDNISolone (MEDROL) 4 MG tablet Standard 6 day taper medrol dose pack. (Patient taking differently: Take 4 mg by mouth See admin instructions. Standard 6 day taper medrol dose pack.)   No facility-administered encounter medications on file as of 11/11/2023.    Social History: Social History    Tobacco Use   Smoking status: Former    Current packs/day: 0.00    Types: Cigarettes    Quit date: 03/07/1988    Years since quitting: 35.7   Smokeless tobacco: Current    Types: Chew  Vaping Use   Vaping status: Never Used  Substance Use Topics   Alcohol use: Not Currently    Comment: 1/month   Drug use: Not Currently    Family Medical History: Family History  Problem Relation Age of Onset   Hypertension Mother    Hyperlipidemia Father    Healthy Sister    Hypertension Maternal Uncle    Hyperlipidemia Maternal Uncle    Hepatitis Paternal Aunt    Hepatitis C Paternal Aunt    Hypertension Paternal Uncle    Hyperlipidemia Paternal Uncle    Colon cancer Maternal Grandfather    Prostate cancer  Maternal Grandfather    Congestive Heart Failure Maternal Grandfather    Dementia Paternal Grandmother    Heart disease Paternal Grandfather    Heart attack Paternal Grandfather    Healthy Daughter        x4   Healthy Son        x1   Esophageal cancer Neg Hx    Stomach cancer Neg Hx    Rectal cancer Neg Hx     Physical Examination: @VITALWITHPAIN @  General: Patient is well developed, well nourished, calm, collected, and in no apparent distress. Attention to examination is appropriate.  Psychiatric: Patient is non-anxious.  Head:  Pupils equal, round, and reactive to light.  ENT:  Oral mucosa appears well hydrated.  Neck:   Supple.  Decreased range of motion.  Respiratory: Patient is breathing without any difficulty.  Extremities: No edema.  Vascular: Palpable dorsal pedal pulses.  Skin:   On exposed skin, there are no abnormal skin lesions.  NEUROLOGICAL:     Awake, alert, oriented to person, place, and time.  Speech is clear and fluent. Fund of knowledge is appropriate.   Cranial Nerves: Pupils equal round and reactive to light.  Facial tone is symmetric.     Patient was supplemented range of motion to cervical spine.  Positive  Spurling's.  Strength: Side Biceps Triceps Deltoid Interossei Grip Wrist Ext. Wrist Flex.  R 5 5 5 5 5 5 5   L 5 5 5 5 5 5 5     Reflexes are 2+ and symmetric at the biceps, triceps, brachioradialis.   Hoffman's is absent.  Bilateral upper extremity sensation is intact to light touch.    Gait is normal.   No difficulty with tandem gait.   No evidence of dysmetria noted.  Medical Decision Making  Imaging: MRI cervical spine 11/04/23:  IMPRESSION: 1. Multilevel cervical spondylosis as described above. Moderate spinal canal stenosis and moderate to severe bilateral neuroforaminal stenosis at C3-C4. 2. Mild spinal canal stenosis with moderate to severe left neuroforaminal stenosis at C4-C5. 3. Mild spinal canal stenosis with severe right and moderate left neuroforaminal stenosis at C5-C6. 4. Moderate right neuroforaminal stenosis at T1-T2.  I have personally reviewed the images and agree with the above interpretation.  Assessment and Plan: George Ward is a pleasant 53 y.o. male is here today with a chief complaint of neck pain.  He states that when he turns his head a certain way he has shooting pain as well as numbness and tingling that radiates down to his left elbow.  He notices that his right upper extremity the neck pain radiates down to his first 2 digits.  This has been ongoing for 3 months when he fell while looking after a large dog.  He feels as though the numbness in the range of his pain is larger.  He was given a dose of steroids as well as Tylenol, Octagam, gabapentin, and using home exercises however has continued with pain.  Working at the computer and sleeping makes it worse.  He also has quite a bit of midline neck pain.  He denies any weakness in his upper extremities.  He has had no issues with fine motor skills or dropping things.  He also denies any changes to his gait or issues with walking.  He has never had any injections in his cervical spine.  On examination,  positive Spurling's.  Full strength of bilateral upper extremities.  Normoreflexic, no Hoffmann sign.  MRI reviewed which does show severe stenosis  at C3-4.  Moderate stenosis at C4-5 and severe stenosis at C5-6.  Pleasure to see patient in clinic today.  He does have significant stenosis on MRI, but likely he is not myelopathic and is not having any weakness at the present.  I do believe he is suffering from a cervical radiculopathy.  At this time would like him to do formal physical therapy.  In addition would like him to undergo flexion-extension x-rays for further evaluation.  Plan to see back in approximately 6 to 7 weeks.  Red flag symptoms were reviewed with patient at length.  He was encouraged to reach out to [place] for any questions or concerns he has in the future prior to his follow-up.   Thank you for involving me in the care of this patient.   Joan Flores, PA-C Dept. of Neurosurgery

## 2023-11-09 ENCOUNTER — Other Ambulatory Visit: Payer: Self-pay | Admitting: Pulmonary Disease

## 2023-11-11 ENCOUNTER — Ambulatory Visit: Payer: 59 | Admitting: Physician Assistant

## 2023-11-11 ENCOUNTER — Encounter: Payer: Self-pay | Admitting: Physician Assistant

## 2023-11-11 VITALS — BP 132/90 | Ht 70.0 in | Wt 200.0 lb

## 2023-11-11 DIAGNOSIS — M4802 Spinal stenosis, cervical region: Secondary | ICD-10-CM | POA: Diagnosis not present

## 2023-11-11 DIAGNOSIS — M5412 Radiculopathy, cervical region: Secondary | ICD-10-CM | POA: Diagnosis not present

## 2023-11-11 DIAGNOSIS — R9389 Abnormal findings on diagnostic imaging of other specified body structures: Secondary | ICD-10-CM

## 2023-11-11 DIAGNOSIS — M5010 Cervical disc disorder with radiculopathy, unspecified cervical region: Secondary | ICD-10-CM

## 2023-11-11 NOTE — Patient Instructions (Signed)
 LOCAL PHYSICAL THERAPY  [facility]  [address]  [place], [place] [address]  [phone]  [facility]  [address] [place], [place] [address]  [phone]  [facility] (2 locations)  [address]  [place], [place] [address]  [phone]          or  [address]  [place], [place] [address]  [phone]  [facility]  [address]  [place], [place] [address]  [phone]  **dry needling**  [facility])  [address]  [place], [place] [address]  [phone]  Fax: [phone]  ** Aquatic therapy[address] [place], [place] [address] [phone] **Aquatic therapy**  [facility]  [facility]  [address]  [place], [place] [address]  [phone]  [facility]  [address]  [place], [place] [address]  [phone]  [facility]  [address]   Janora Norlander  [place], [place] [address]  [phone]  [facility]  [address]  [place], [place] [address]  [phone]  **dry needling**   PELVIC FLOOR/SI JOINT  [facility]  Mariane Masters, PT  [email or web address]   [place]  [facility]  [address]  [address] [place], [place] [address]  [phone]   [facility] [place]  [address], [place] [address]  [phone]   [facility], [place]  [facility]  Raymond Gurney, PT  [address]  [place], [place] [address] [phone]   Samara Deist  [facility]  [address]  [phone]   [facility]  [address]  [phone]   [facility]  Annamaria Boots [facility]  [address]  [place], [place] [address]  [phone]   **No Aetna or medicaid**  [facility]  [address] [place] [address]  [phone]  [facility]  [address]  [place], [place] [address]  [phone]  Dannial Monarch, [place]  [address] [place], [place] [address]  [phone]  [facility]  [address] [place] [address]  [phone]  [facility]  [address]  [place], [place] [address]  [phone]  [facility]  (3 locations)  [address]  [place], [place] [address]  [phone]  **dry needling**           or  [address] [place], [place] [address]  [phone]  **Additionally has Gloris Manchester, OT**           or  [address]  [place], [place] [address]  [phone]  **Pediatric therapy**  [facility]  [address]  [phone]  **dry needlingVerdie Drown [facility]  [address]  [place], [place] [address]  [phone]  [facility]   (Inside [address] [facility])  [address]  [place], [place] [address]  [phone]  **dry needling**  **MEDICAID or UNINSURED** [facility] dept. Of Physical Therapy [place], [place] [address] [phone]  [facility]  [address] [place], [place] [address]  [phone]   [facility]  [address]  [place], [place] [address]  [phone]   Doreatha Martin  [facility]  [address] [place], [place] [address]  [phone]   [facility]  [address]  [place], [place] [address]  [phone]   [facility]  [address] [place], [place] [address]  [phone]  [facility]  [address]  [place], [place] [address]  [phone]  AQUATIC  Kathalene Frames [facility]  [employer]  [facility]  [place]  [place]  *Residents only*   [facility] [employer]  *[facility]  Exercise class  [facility]  Exercise class  [facility]  [address]  [place], [place]   [phone]  [facility]  [address]  [place], [place] [address]  [phone]   [place], [place]  [facility]  [address]  [phone]   [facility]  [facility]  [address]  [phone]           or  [address]  [phone]   [facility] Arthritis Support Group   Provides education and support and practical information for coping with arthritis for arthritis sufferers and their families.   When: 12:15 - 1:30 p.m. the second Monday of each month, March through December  Info: Call Rehabilitation Services at [phone]

## 2023-11-17 ENCOUNTER — Ambulatory Visit (HOSPITAL_BASED_OUTPATIENT_CLINIC_OR_DEPARTMENT_OTHER)
Admission: RE | Admit: 2023-11-17 | Discharge: 2023-11-17 | Disposition: A | Discharge status: Home / Self Care | Source: Ambulatory Visit | Attending: Physician Assistant | Admitting: Physician Assistant

## 2023-11-17 DIAGNOSIS — M5010 Cervical disc disorder with radiculopathy, unspecified cervical region: Secondary | ICD-10-CM | POA: Diagnosis present

## 2023-11-21 ENCOUNTER — Other Ambulatory Visit: Payer: Self-pay | Admitting: Physician Assistant

## 2023-11-21 DIAGNOSIS — M5010 Cervical disc disorder with radiculopathy, unspecified cervical region: Secondary | ICD-10-CM

## 2023-11-22 ENCOUNTER — Other Ambulatory Visit: Payer: Self-pay | Admitting: Physician Assistant

## 2023-11-22 DIAGNOSIS — M5010 Cervical disc disorder with radiculopathy, unspecified cervical region: Secondary | ICD-10-CM

## 2023-11-22 DIAGNOSIS — R9389 Abnormal findings on diagnostic imaging of other specified body structures: Secondary | ICD-10-CM

## 2023-11-25 ENCOUNTER — Other Ambulatory Visit: Payer: Self-pay | Admitting: Medical

## 2023-11-27 ENCOUNTER — Ambulatory Visit: Attending: Physician Assistant | Admitting: Physical Therapy

## 2023-11-27 ENCOUNTER — Other Ambulatory Visit: Payer: Self-pay

## 2023-11-27 DIAGNOSIS — M6281 Muscle weakness (generalized): Secondary | ICD-10-CM | POA: Diagnosis present

## 2023-11-27 DIAGNOSIS — R252 Cramp and spasm: Secondary | ICD-10-CM | POA: Insufficient documentation

## 2023-11-27 DIAGNOSIS — M5010 Cervical disc disorder with radiculopathy, unspecified cervical region: Secondary | ICD-10-CM | POA: Diagnosis not present

## 2023-11-27 DIAGNOSIS — M5412 Radiculopathy, cervical region: Secondary | ICD-10-CM | POA: Insufficient documentation

## 2023-11-27 NOTE — Therapy (Signed)
 OUTPATIENT PHYSICAL THERAPY CERVICAL EVALUATION   Patient Name: George Ward MRN: [id number] DOB:December 29, 1970, 53 y.o., male Today's Date: 11/27/2023  END OF SESSION:  PT End of Session - 11/27/23 1155     Visit Number 1    Date for PT Re-Evaluation 01/22/24    Authorization Type [employer] 2025 VL: [place]    PT Start Time 1152    PT Stop Time 1235    PT Time Calculation (min) 43 min    Activity Tolerance Patient tolerated treatment well    Behavior During Therapy WFL for tasks assessed/performed             Past Medical History:  Diagnosis Date   Alcohol abuse    Remission >20 years ago   Allergy    Anxiety    Asthma    Cancer (HCC)    Depression    MAJOR DEPRESSIVE DISORDER   Depression with anxiety    Drug abuse (HCC)    Remission >20 years ago   Environmental allergies    History of chicken pox    Hyperlipidemia    Hypertension    Hypoalphalipoproteinemia, familial    Lumbar pain    Migraines    Primary generalized (osteo)arthritis    Seasonal allergies    Past Surgical History:  Procedure Laterality Date   EXCISION MASS HEAD  03/17/2018   excision cells on head  ; still in testing for cancer per patient report    LUMBAR EPIDURAL INJECTION     SHOULDER ARTHROSCOPY Left 2021   UMBILICAL HERNIA REPAIR N/A 04/02/2018   Procedure: OPEN UMBILICAL HERNIA REPAIR WITH INSERTION OF MESH;  Surgeon: Andria Meuse, MD;  Location: [facility];  Service: General;  Laterality: N/A;   WISDOM TOOTH EXTRACTION  1989-1990   Patient Active Problem List   Diagnosis Date Noted   Asthma 09/18/2022   Coronary artery calcification 12/13/2021   Dyspnea on exertion 12/13/2021   Dyslipidemia 12/13/2021   Essential hypertension 12/13/2021   Migraine 03/08/2021   Impulse control disorder in adult 03/06/2017   MDD (major depressive disorder) 03/06/2017    PCP: Marisue [place]   REFERRING PROVIDER: Joan Flores, PA-C  REFERRING DIAG: M50.10 (ICD-10-CM)  - Cervical disc disorder with radiculopathy of cervical region   THERAPY DIAG:  Radiculopathy, cervical region  Cramp and spasm  Muscle weakness (generalized)  Rationale for Evaluation and Treatment: Rehabilitation  ONSET DATE: December 2024  SUBJECTIVE:  SUBJECTIVE STATEMENT: Neck hurts most of the time, more on the left and back than right, moves certain way it all starts to go numb down my shoulder to elbow, sometimes does get tingling and numbness down right side to fingers but not as frequent or intense, difficult to turn to left.  I don't have a car, but its very difficult to drive wifes car, so haven't been driving.  Hard to lift, that makes it way worse.  Sleeping is an issue, I'm a side sleeping, I have to position pillow just right so pain and numbness don't get work.   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    Hand dominance: Ambidextrous write with Left hand  PERTINENT HISTORY:  From MD notes on 11/11/23 "George Ward is here today with a chief complaint of neck pain.  He states that when he turns his head a certain way he has shooting pain as well as numbness and tingling that radiates down to his left elbow.  He notices that his right upper extremity the neck pain radiates down to his first 2 digits.  This has been ongoing for 3 months when he fell while looking after a large dog.  He feels as though the numbness in the range of his pain is larger.  He was given a dose of steroids as well as Tylenol, Octagam, gabapentin, and using home exercises however has continued with pain.  Working at the computer and  sleeping makes it worse.  He also has quite a bit of midline neck pain.  He denies any weakness in his upper extremities.  He has had no issues with fine motor skills or dropping things.  He also denies any changes to his gait or issues with walking."   PMH: HTN, major depressive disorder, migraines, LBP  PAIN:  Are you having pain? Yes: NPRS scale: 5-9/10 Pain location: left side neck radiating down L shoulder, with numbness and tingling in hands Pain description: aching, radiating Aggravating factors: lifting, turning head Relieving factors: gabapentin  PRECAUTIONS: None  RED FLAGS: None     WEIGHT BEARING RESTRICTIONS: No  FALLS:  Has patient fallen in last 6 months? Yes. Number of falls 1- tripped over large dog  LIVING ENVIRONMENT: Lives with: lives with their family Lives in: House/apartment Stairs: Yes: Internal: 14 steps; on left going up and External: 1 steps; none - stairs at work as well, teaches on second floor Has following equipment at home: None  OCCUPATION: Runner, broadcasting/film/video, SW middle  PLOF: Independent  Leisure: walking to work daily, coaches track and volleyball  PATIENT GOALS: "to see if there is anything can do about neck pain and numbness"  NEXT MD VISIT: has epidural scheduled for neck on 12/03/2023  OBJECTIVE:   DIAGNOSTIC FINDINGS:  MRI cervical spine 11/04/23:   IMPRESSION: 1. Multilevel cervical spondylosis as described above. Moderate spinal canal stenosis and moderate to severe bilateral neuroforaminal stenosis at C3-C4. 2. Mild spinal canal stenosis with moderate to severe left neuroforaminal stenosis at C4-C5. 3. Mild spinal canal stenosis with severe right and moderate left neuroforaminal stenosis at C5-C6. 4. Moderate right neuroforaminal stenosis at T1-T2.  PATIENT SURVEYS:  NDI 21/50  COGNITION: Overall cognitive status: Within functional limits for tasks assessed  SENSATION: Light touch: Impaired  Diminished along R  C6 dermatome, but  reports numbness and tingling both arms, worse on L side especially in C5 dermatome.    POSTURE: No Significant postural limitations  PALPATION: Tenderness cervical paraspinals, L scalenes, L 1st rib.  Palpation of muscles on L reproduced radicular symptoms in L arm.    CERVICAL ROM:   Active ROM A/PROM (deg) eval  Flexion 28  Extension 18  Right lateral flexion 25  Left lateral flexion 25  Right rotation 44  Left rotation 42   (Blank rows = not tested)  UPPER EXTREMITY ROM:  WNL, symmetric and pain free.    UPPER EXTREMITY MMT:  MMT Right eval Left eval  Shoulder flexion 5 5  Shoulder abduction 5 5  Shoulder internal rotation 5 5  Shoulder external rotation 5 5  Elbow flexion 5 5  Elbow extension 5 5  Wrist flexion 5 5  Wrist extension 5 5  Grip strength 75 45   (Blank rows = not tested)  CERVICAL SPECIAL TESTS:  Spurling's test: Positive and Distraction test: Positive  FUNCTIONAL TESTS:  NT  TODAY'S TREATMENT:                                                                                                                              DATE:   11/27/23 EVAL Self Care: Findings, POC, self traction devices - inexpensive options available on [employer], imaging, red flag symptoms, advice on placement of TENS electrodes, heat v. cold.  Therapeutic Exercise: to improve strength and mobility.  Demo, verbal and tactile cues throughout for technique.  Seated Cervical Retraction  - 10 reps - Seated Shoulder Rolls   - 10 reps - First Rib Mobilization with Strap   - 10 reps  PATIENT EDUCATION:  Education details: see above Person educated: Patient Education method: Explanation, Demonstration, Verbal cues, and Handouts Education comprehension: verbalized understanding and returned demonstration  HOME EXERCISE PROGRAM: Access Code: [email or web address] URL: [email or web address] Date: 11/28/2023 Prepared by: Harrie Foreman  Exercises - Seated Cervical Retraction   - 3 x daily - 7 x weekly - 1 sets - 10 reps - Seated Shoulder Rolls  - 3 x daily - 7 x weekly - 1 sets - 10 reps - First Rib Mobilization with Strap  - 1 x daily - 7 x weekly - 1 sets - 10 reps  ASSESSMENT:  CLINICAL IMPRESSION: Patient is a 53 y.o. left hand dominant malewho was seen today for physical therapy evaluation and treatment for cervical radiculopathy.  His primary symptoms are on the L side in C5 dermatome, however he is losing grip strength in left hand, measured today 45lbs on left compared to 75lbs on right.  He also does get radicular symptoms on Right as well and has diminished sensation on R C6 dermatome.  He has significantly limited cervical ROM and pain with all neck movements.  Today given initial HEP and advice on inexpensive cervical traction device, as he did get relief with distraction.  Cervical ESI scheduled for next week.  Judith Blonder Fluellen would benefit from skilled physical therapy to decrease pain, centralize symptoms, improve cervical AROM, and improve activity tolerance.   OBJECTIVE IMPAIRMENTS: decreased ROM, decreased strength, hypomobility, increased fascial restrictions, impaired perceived functional ability, increased muscle spasms, impaired tone, and pain.   ACTIVITY LIMITATIONS: carrying, lifting, bending, sleeping, bed mobility, and caring for others  PARTICIPATION LIMITATIONS: meal prep, cleaning, laundry, driving, shopping, community activity, occupation, and yard work  PERSONAL FACTORS: Time since onset of injury/illness/exacerbation and 1-2 comorbidities: HTN, major depressive disorder, migraines,  are also affecting patient's functional outcome.   REHAB POTENTIAL: Good  CLINICAL DECISION MAKING: Evolving/moderate complexity  EVALUATION COMPLEXITY: Moderate   GOALS: Goals reviewed with patient? Yes  SHORT TERM GOALS: Target date: 12/19/2023   Patient will be independent with initial HEP.  Baseline:  Goal status: INITIAL  LONG TERM GOALS:  Target date: 01/22/2024   Patient will be independent with advanced/ongoing HEP to improve outcomes and carryover.  Baseline:  Goal status: INITIAL  2.  Patient will report 75% improvement in neck pain to improve QOL.  Baseline: 5-9/10 Goal status: INITIAL  3.  Patient will demonstrate full pain free cervical ROM for safety with driving.  Baseline: see objective Goal status: INITIAL  4.  Patient will report at least 11 points improvement on NDI to demonstrate improved functional ability.  Baseline: 21/50 Goal status: INITIAL  5.  Patient will demonstrate 75% improvement in radicular symptoms.  Baseline: radiates down both arms Goal status: INITIAL  6. Patient will improve Left grip strength to 75lbs.    Baseline: 45 lbs Goal status: INITIAL   PLAN:  PT FREQUENCY: 2x/week  PT DURATION: 8 weeks  PLANNED INTERVENTIONS: 97110-Therapeutic exercises, 97530- Therapeutic activity, [id number]- Neuromuscular re-education, 97535- Self Care, 04540- Manual therapy, [id number]- Electrical stimulation (unattended), [phone]- Electrical stimulation (manual), [id number]- Ultrasound, 14782- Traction (mechanical), Patient/Family education, Balance training, Stair training, Taping, Dry Needling, Joint mobilization, Joint manipulation, Spinal manipulation, Spinal mobilization, Cryotherapy, and Moist heat  PLAN FOR NEXT SESSION: review and progress HEP, manual therapy, try cervical traction   Jena Gauss, PT, DPT 11/27/2023, 4:14 PM

## 2023-12-02 NOTE — Discharge Instructions (Signed)

## 2023-12-03 ENCOUNTER — Ambulatory Visit
Admission: RE | Admit: 2023-12-03 | Discharge: 2023-12-03 | Disposition: A | Discharge status: Home / Self Care | Source: Ambulatory Visit | Attending: Physician Assistant | Admitting: Physician Assistant

## 2023-12-03 DIAGNOSIS — R9389 Abnormal findings on diagnostic imaging of other specified body structures: Secondary | ICD-10-CM

## 2023-12-03 DIAGNOSIS — M5010 Cervical disc disorder with radiculopathy, unspecified cervical region: Secondary | ICD-10-CM

## 2023-12-03 MED ORDER — TRIAMCINOLONE ACETONIDE 40 MG/ML IJ SUSP (RADIOLOGY)
60.0000 mg | Freq: Once | INTRAMUSCULAR | Status: AC
  Administered 2023-12-03: 60 mg via EPIDURAL

## 2023-12-03 MED ORDER — IOPAMIDOL (ISOVUE-M 300) INJECTION 61%
1.0000 mL | Freq: Once | INTRAMUSCULAR | Status: AC
  Administered 2023-12-03: 1 mL via EPIDURAL

## 2023-12-04 ENCOUNTER — Other Ambulatory Visit: Payer: Self-pay | Admitting: Medical

## 2023-12-11 ENCOUNTER — Ambulatory Visit: Attending: Physician Assistant | Admitting: Physical Therapy

## 2023-12-11 DIAGNOSIS — R252 Cramp and spasm: Secondary | ICD-10-CM | POA: Insufficient documentation

## 2023-12-11 DIAGNOSIS — M6281 Muscle weakness (generalized): Secondary | ICD-10-CM | POA: Diagnosis present

## 2023-12-11 DIAGNOSIS — M5412 Radiculopathy, cervical region: Secondary | ICD-10-CM | POA: Diagnosis present

## 2023-12-11 NOTE — Therapy (Signed)
 OUTPATIENT PHYSICAL THERAPY TREATMENT   Patient Name: George Ward MRN: [id number] DOB:[birth year], 53 y.o., male Today's Date: 12/11/2023  END OF SESSION:  PT End of Session - 12/11/23 1450     Visit Number 2    Date for PT Re-Evaluation 01/22/24    Authorization Type [employer] [facility] 2025 VL: MN    PT Start Time 1450    PT Stop Time 1530    PT Time Calculation (min) 40 min    Activity Tolerance Patient tolerated treatment well    Behavior During Therapy WFL for tasks assessed/performed              Past Medical History:  Diagnosis Date   Alcohol abuse    Remission >20 years ago   Allergy    Anxiety    Asthma    Cancer (HCC)    Depression    MAJOR DEPRESSIVE DISORDER   Depression with anxiety    Drug abuse (HCC)    Remission >20 years ago   Environmental allergies    History of chicken pox    Hyperlipidemia    Hypertension    Hypoalphalipoproteinemia, familial    Lumbar pain    Migraines    Primary generalized (osteo)arthritis    Seasonal allergies    Past Surgical History:  Procedure Laterality Date   EXCISION MASS HEAD  03/17/2018   excision cells on head  ; still in testing for cancer per patient report    LUMBAR EPIDURAL INJECTION     SHOULDER ARTHROSCOPY Left 2021   UMBILICAL HERNIA REPAIR N/A 04/02/2018   Procedure: OPEN UMBILICAL HERNIA REPAIR WITH INSERTION OF MESH;  Surgeon: Andria Meuse, MD;  Location: [facility];  Service: General;  Laterality: N/A;   WISDOM TOOTH EXTRACTION  1989-1990   Patient Active Problem List   Diagnosis Date Noted   Asthma 09/18/2022   Coronary artery calcification 12/13/2021   Dyspnea on exertion 12/13/2021   Dyslipidemia 12/13/2021   Essential hypertension 12/13/2021   Migraine 03/08/2021   Impulse control disorder in adult 03/06/2017   MDD (major depressive disorder) 03/06/2017    PCP: Marisue Brooklyn   REFERRING PROVIDER: Joan Flores, PA-C  REFERRING DIAG: M50.10 (ICD-10-CM) -  Cervical disc disorder with radiculopathy of cervical region   THERAPY DIAG:  Radiculopathy, cervical region  Cramp and spasm  Muscle weakness (generalized)  Rationale for Evaluation and Treatment: Rehabilitation  ONSET DATE: December 2024  SUBJECTIVE:  SUBJECTIVE STATEMENT: Pt reports he got his epidural last week which helped. Pain has decreased N/T is still present. Stretches have improved some mobility. Numbness is 1/10.   From eval: Neck hurts most of the time, more on the left and back than right, moves certain way it all starts to go numb down my shoulder to elbow, sometimes does get tingling and numbness down right side to fingers but not as frequent or intense, difficult to turn to left.  I don't have a car, but its very difficult to drive wifes car, so haven't been driving.  Hard to lift, that makes it way worse.  Sleeping is an issue, I'm a side sleeping, I have to position pillow just right so pain and numbness don't get work.   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    Hand dominance: Ambidextrous write with Left hand  PERTINENT HISTORY:  From MD notes on 11/11/23 "Mr. Bayne Fosnaugh is here today with a chief complaint of neck pain.  He states that when he turns his head a certain way he has shooting pain as well as numbness and tingling that radiates down to his left elbow.  He notices that his right upper extremity the neck pain radiates down to his first 2 digits.  This has been ongoing for 3 months when he fell while looking after a large dog.  He feels as though the numbness in the range of his pain is larger.   He was given a dose of steroids as well as Tylenol, Octagam, gabapentin, and using home exercises however has continued with pain.  Working at the computer and sleeping makes it worse.  He also has quite a bit of midline neck pain.  He denies any weakness in his upper extremities.  He has had no issues with fine motor skills or dropping things.  He also denies any changes to his gait or issues with walking."   PMH: HTN, major depressive disorder, migraines, LBP  PAIN:  Are you having pain? Yes: NPRS scale: 2 or 3/10 Pain location: left side neck radiating down L shoulder, with numbness and tingling in hands Pain description: aching, radiating Aggravating factors: lifting, turning head Relieving factors: gabapentin  PRECAUTIONS: None  RED FLAGS: None     WEIGHT BEARING RESTRICTIONS: No  FALLS:  Has patient fallen in last 6 months? Yes. Number of falls 1- tripped over large dog  LIVING ENVIRONMENT: Lives with: lives with their family Lives in: House/apartment Stairs: Yes: Internal: 14 steps; on left going up and External: 1 steps; none - stairs at work as well, teaches on second floor Has following equipment at home: None  OCCUPATION: Runner, broadcasting/film/video, SW middle  PLOF: Independent  Leisure: walking to work daily, coaches track and volleyball  PATIENT GOALS: "to see if there is anything can do about neck pain and numbness"  NEXT MD VISIT: has epidural scheduled for neck on 12/03/2023  OBJECTIVE:   DIAGNOSTIC FINDINGS:  MRI cervical spine 11/04/23:   IMPRESSION: 1. Multilevel cervical spondylosis as described above. Moderate spinal canal stenosis and moderate to severe bilateral neuroforaminal stenosis at C3-C4. 2. Mild spinal canal stenosis with moderate to severe left neuroforaminal stenosis at C4-C5. 3. Mild spinal canal stenosis with severe right and moderate left neuroforaminal stenosis at C5-C6. 4. Moderate  right neuroforaminal stenosis at T1-T2.  PATIENT SURVEYS:  NDI  21/50  COGNITION: Overall cognitive status: Within functional limits for tasks assessed  SENSATION: Light touch: Impaired  Diminished along R C6 dermatome, but reports numbness and tingling both arms, worse on L side especially in C5 dermatome.    POSTURE: No Significant postural limitations  PALPATION: Tenderness cervical paraspinals, L scalenes, L 1st rib.  Palpation of muscles on L reproduced radicular symptoms in L arm.    CERVICAL ROM:   Active ROM A/PROM (deg) eval  Flexion 28  Extension 18  Right lateral flexion 25  Left lateral flexion 25  Right rotation 44  Left rotation 42   (Blank rows = not tested)  UPPER EXTREMITY ROM:  WNL, symmetric and pain free.    UPPER EXTREMITY MMT:  MMT Right eval Left eval  Shoulder flexion 5 5  Shoulder abduction 5 5  Shoulder internal rotation 5 5  Shoulder external rotation 5 5  Elbow flexion 5 5  Elbow extension 5 5  Wrist flexion 5 5  Wrist extension 5 5  Grip strength 75 45   (Blank rows = not tested)  CERVICAL SPECIAL TESTS:  Spurling's test: Positive and Distraction test: Positive  FUNCTIONAL TESTS:  NT  TODAY'S TREATMENT:                                                                                                                              DATE:  12/11/23 Seated levator scap stretch x30" UT stretch x30" Anterior scalene stretch x30" Doorway pec stretch 60 deg and 90 deg of abd x 30" each Cervical traction: 17# max pull, 15 sec rest, 60 sec hold x 10 min total Supine cervical retraction x10 Supine deep neck flexor activation x10 Supine cervical retraction + rotation x10 Self care: positions to avoid at rest/sitting and sleeping  11/27/23 EVAL Self Care: Findings, POC, self traction devices - inexpensive options available on [employer], imaging, red flag symptoms, advice on placement of TENS electrodes, heat v. cold.  Therapeutic Exercise: to improve strength and mobility.  Demo, verbal and tactile cues  throughout for technique.  Seated Cervical Retraction  - 10 reps - Seated Shoulder Rolls   - 10 reps - First Rib Mobilization with Strap   - 10 reps  PATIENT EDUCATION:  Education details: see above Person educated: Patient Education method: Explanation, Demonstration, Verbal cues, and Handouts Education comprehension: verbalized understanding and returned demonstration  HOME EXERCISE PROGRAM: Access Code: [email or web address] URL: [email or web address] Date: 11/28/2023 Prepared by: Harrie Foreman  Exercises - Seated Cervical Retraction  - 3 x daily - 7 x weekly - 1 sets - 10 reps - Seated Shoulder Rolls  - 3 x daily - 7 x weekly - 1 sets - 10 reps - First Rib Mobilization with Strap  - 1 x daily - 7 x weekly - 1 sets - 10 reps  ASSESSMENT:  CLINICAL IMPRESSION: Performed trial of cervical mechanical traction this  session. Pain and N/T has been better controlled since his epidural. Worked on initiating deep neck muscle strengthening and postural stabilization today.   From eval: Patient is a 53 y.o. left hand dominant malewho was seen today for physical therapy evaluation and treatment for cervical radiculopathy.  His primary symptoms are on the L side in C5 dermatome, however he is losing grip strength in left hand, measured today 45lbs on left compared to 75lbs on right.  He also does get radicular symptoms on Right as well and has diminished sensation on R C6 dermatome.  He has significantly limited cervical ROM and pain with all neck movements.  Today given initial HEP and advice on inexpensive cervical traction device, as he did get relief with distraction.  Cervical ESI scheduled for next week.  Judith Blonder Digman would benefit from skilled physical therapy to decrease pain, centralize symptoms, improve cervical AROM, and improve activity tolerance.   OBJECTIVE IMPAIRMENTS: decreased ROM, decreased strength, hypomobility, increased fascial restrictions, impaired perceived  functional ability, increased muscle spasms, impaired tone, and pain.   ACTIVITY LIMITATIONS: carrying, lifting, bending, sleeping, bed mobility, and caring for others  PARTICIPATION LIMITATIONS: meal prep, cleaning, laundry, driving, shopping, community activity, occupation, and yard work  PERSONAL FACTORS: Time since onset of injury/illness/exacerbation and 1-2 comorbidities: HTN, major depressive disorder, migraines,  are also affecting patient's functional outcome.   REHAB POTENTIAL: Good  CLINICAL DECISION MAKING: Evolving/moderate complexity  EVALUATION COMPLEXITY: Moderate   GOALS: Goals reviewed with patient? Yes  SHORT TERM GOALS: Target date: 12/19/2023   Patient will be independent with initial HEP.  Baseline:  Goal status: INITIAL  LONG TERM GOALS: Target date: 01/22/2024   Patient will be independent with advanced/ongoing HEP to improve outcomes and carryover.  Baseline:  Goal status: INITIAL  2.  Patient will report 75% improvement in neck pain to improve QOL.  Baseline: 5-9/10 Goal status: INITIAL  3.  Patient will demonstrate full pain free cervical ROM for safety with driving.  Baseline: see objective Goal status: INITIAL  4.  Patient will report at least 11 points improvement on NDI to demonstrate improved functional ability.  Baseline: 21/50 Goal status: INITIAL  5.  Patient will demonstrate 75% improvement in radicular symptoms.  Baseline: radiates down both arms Goal status: INITIAL  6. Patient will improve Left grip strength to 75lbs.    Baseline: 45 lbs Goal status: INITIAL   PLAN:  PT FREQUENCY: 2x/week  PT DURATION: 8 weeks  PLANNED INTERVENTIONS: 97110-Therapeutic exercises, 97530- Therapeutic activity, [id number]- Neuromuscular re-education, 97535- Self Care, 16109- Manual therapy, G0283- Electrical stimulation (unattended), [phone]- Electrical stimulation (manual), [id number]- Ultrasound, 09811- Traction (mechanical), Patient/Family education,  Balance training, Stair training, Taping, Dry Needling, Joint mobilization, Joint manipulation, Spinal manipulation, Spinal mobilization, Cryotherapy, and Moist heat  PLAN FOR NEXT SESSION: review and progress HEP, manual therapy, try cervical traction   Denham Mose April Ma L Aniceto Kyser, PT, DPT 12/11/2023, 2:50 PM

## 2023-12-16 ENCOUNTER — Ambulatory Visit

## 2023-12-16 DIAGNOSIS — M6281 Muscle weakness (generalized): Secondary | ICD-10-CM

## 2023-12-16 DIAGNOSIS — M5412 Radiculopathy, cervical region: Secondary | ICD-10-CM

## 2023-12-16 DIAGNOSIS — R252 Cramp and spasm: Secondary | ICD-10-CM

## 2023-12-16 NOTE — Therapy (Signed)
 OUTPATIENT PHYSICAL THERAPY TREATMENT   Patient Name: George Ward MRN: [id number] DOB:February 22, 1971, 53 y.o., male Today's Date: 12/16/2023  END OF SESSION:  PT End of Session - 12/16/23 1445     Visit Number 3    Date for PT Re-Evaluation 01/22/24    Authorization Type [employer] 2025 VL: MN    PT Start Time 1445    PT Stop Time 1524    PT Time Calculation (min) 39 min    Activity Tolerance Patient tolerated treatment well    Behavior During Therapy WFL for tasks assessed/performed               Past Medical History:  Diagnosis Date   Alcohol abuse    Remission >20 years ago   Allergy    Anxiety    Asthma    Cancer (HCC)    Depression    MAJOR DEPRESSIVE DISORDER   Depression with anxiety    Drug abuse (HCC)    Remission >20 years ago   Environmental allergies    History of chicken pox    Hyperlipidemia    Hypertension    Hypoalphalipoproteinemia, familial    Lumbar pain    Migraines    Primary generalized (osteo)arthritis    Seasonal allergies    Past Surgical History:  Procedure Laterality Date   EXCISION MASS HEAD  03/17/2018   excision cells on head  ; still in testing for cancer per patient report    LUMBAR EPIDURAL INJECTION     SHOULDER ARTHROSCOPY Left 2021   UMBILICAL HERNIA REPAIR N/A 04/02/2018   Procedure: OPEN UMBILICAL HERNIA REPAIR WITH INSERTION OF MESH;  Surgeon: Andria Meuse, MD;  Location: [facility];  Service: General;  Laterality: N/A;   WISDOM TOOTH EXTRACTION  1989-1990   Patient Active Problem List   Diagnosis Date Noted   Asthma 09/18/2022   Coronary artery calcification 12/13/2021   Dyspnea on exertion 12/13/2021   Dyslipidemia 12/13/2021   Essential hypertension 12/13/2021   Migraine 03/08/2021   Impulse control disorder in adult 03/06/2017   MDD (major depressive disorder) 03/06/2017    PCP: Marisue Brooklyn   REFERRING PROVIDER: Joan Flores, PA-C  REFERRING DIAG: M50.10 (ICD-10-CM) -  Cervical disc disorder with radiculopathy of cervical region   THERAPY DIAG:  Radiculopathy, cervical region  Cramp and spasm  Muscle weakness (generalized)  Rationale for Evaluation and Treatment: Rehabilitation  ONSET DATE: December 2024  SUBJECTIVE:  SUBJECTIVE STATEMENT: Doing good today, a little stiffness  From eval: Neck hurts most of the time, more on the left and back than right, moves certain way it all starts to go numb down my shoulder to elbow, sometimes does get tingling and numbness down right side to fingers but not as frequent or intense, difficult to turn to left.  I don't have a car, but its very difficult to drive wifes car, so haven't been driving.  Hard to lift, that makes it way worse.  Sleeping is an issue, I'm a side sleeping, I have to position pillow just right so pain and numbness don't get work.   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    Hand dominance: Ambidextrous write with Left hand  PERTINENT HISTORY:  From MD notes on 11/11/23 "George Ward is here today with a chief complaint of neck pain.  He states that when he turns his head a certain way he has shooting pain as well as numbness and tingling that radiates down to his left elbow.  He notices that his right upper extremity the neck pain radiates down to his first 2 digits.  This has been ongoing for 3 months when he fell while looking after a large dog.  He feels as though the numbness in the range of his pain is larger.  He was given a dose of steroids as well as Tylenol, Octagam, gabapentin, and using home exercises however has  continued with pain.  Working at the computer and sleeping makes it worse.  He also has quite a bit of midline neck pain.  He denies any weakness in his upper extremities.  He has had no issues with fine motor skills or dropping things.  He also denies any changes to his gait or issues with walking."   PMH: HTN, major depressive disorder, migraines, LBP  PAIN:  Are you having pain? Yes: NPRS scale: 2 or 3/10 Pain location: left side neck radiating down L shoulder, with numbness and tingling in hands Pain description: aching, radiating Aggravating factors: lifting, turning head Relieving factors: gabapentin  PRECAUTIONS: None  RED FLAGS: None     WEIGHT BEARING RESTRICTIONS: No  FALLS:  Has patient fallen in last 6 months? Yes. Number of falls 1- tripped over large dog  LIVING ENVIRONMENT: Lives with: lives with their family Lives in: House/apartment Stairs: Yes: Internal: 14 steps; on left going up and External: 1 steps; none - stairs at work as well, teaches on second floor Has following equipment at home: None  OCCUPATION: Runner, broadcasting/film/video, SW middle  PLOF: Independent  Leisure: walking to work daily, coaches track and volleyball  PATIENT GOALS: "to see if there is anything can do about neck pain and numbness"  NEXT MD VISIT: has epidural scheduled for neck on 12/03/2023  OBJECTIVE:   DIAGNOSTIC FINDINGS:  MRI cervical spine 11/04/23:   IMPRESSION: 1. Multilevel cervical spondylosis as described above. Moderate spinal canal stenosis and moderate to severe bilateral neuroforaminal stenosis at C3-C4. 2. Mild spinal canal stenosis with moderate to severe left neuroforaminal stenosis at C4-C5. 3. Mild spinal canal stenosis with severe right and moderate left neuroforaminal stenosis at C5-C6. 4. Moderate right neuroforaminal stenosis at T1-T2.  PATIENT SURVEYS:  NDI 21/50  COGNITION: Overall cognitive status: Within functional limits for  tasks assessed  SENSATION: Light  touch: Impaired  Diminished along R C6 dermatome, but reports numbness and tingling both arms, worse on L side especially in C5 dermatome.    POSTURE: No Significant postural limitations  PALPATION: Tenderness cervical paraspinals, L scalenes, L 1st rib.  Palpation of muscles on L reproduced radicular symptoms in L arm.    CERVICAL ROM:   Active ROM A/PROM (deg) eval  Flexion 28  Extension 18  Right lateral flexion 25  Left lateral flexion 25  Right rotation 44  Left rotation 42   (Blank rows = not tested)  UPPER EXTREMITY ROM:  WNL, symmetric and pain free.    UPPER EXTREMITY MMT:  MMT Right eval Left eval  Shoulder flexion 5 5  Shoulder abduction 5 5  Shoulder internal rotation 5 5  Shoulder external rotation 5 5  Elbow flexion 5 5  Elbow extension 5 5  Wrist flexion 5 5  Wrist extension 5 5  Grip strength 75 45   (Blank rows = not tested)  CERVICAL SPECIAL TESTS:  Spurling's test: Positive and Distraction test: Positive  FUNCTIONAL TESTS:  NT  TODAY'S TREATMENT:                                                                                                                              DATE:  12/16/23 UBE L1.0 3 min fwd and 3 min back Cervical extension w/ pillowcase 10x3" Cervical rotation w/ pillowcase 10x3" bil Supine chin tucks 2x10 Supine chin tucks + cervical rotation 10x3" bil Supine horizontal ABD RTB X 10  Supine ER RTB x 10  Thread the needle Seated lumbar flexion stretch green pball  Manual therapy: STM to bil UT, LS, scalenes  12/11/23 Seated levator scap stretch x30" UT stretch x30" Anterior scalene stretch x30" Doorway pec stretch 60 deg and 90 deg of abd x 30" each Cervical traction: 17# max pull, 15 sec rest, 60 sec hold x 10 min total Supine cervical retraction x10 Supine deep neck flexor activation x10 Supine cervical retraction + rotation x10 Self care: positions to avoid at rest/sitting and sleeping  11/27/23 EVAL Self  Care: Findings, POC, self traction devices - inexpensive options available on [employer], imaging, red flag symptoms, advice on placement of TENS electrodes, heat v. cold.  Therapeutic Exercise: to improve strength and mobility.  Demo, verbal and tactile cues throughout for technique.  Seated Cervical Retraction  - 10 reps - Seated Shoulder Rolls   - 10 reps - First Rib Mobilization with Strap   - 10 reps  PATIENT EDUCATION:  Education details: see above Person educated: Patient Education method: Explanation, Demonstration, Verbal cues, and Handouts Education comprehension: verbalized understanding and returned demonstration  HOME EXERCISE PROGRAM: Access Code: [email or web address] URL: [email or web address] Date: 11/28/2023 Prepared by: Harrie Foreman  Exercises - Seated Cervical Retraction  - 3 x daily - 7 x weekly - 1 sets - 10 reps - Seated Shoulder Rolls  - 3  x daily - 7 x weekly - 1 sets - 10 reps - First Rib Mobilization with Strap  - 1 x daily - 7 x weekly - 1 sets - 10 reps  ASSESSMENT:  CLINICAL IMPRESSION: Pt arrived with no reports of pain today, just stiffness. He tolerated the progression of exercises well. We did MT to address the soft tissue restrictions in both sides of his neck, very well improved tissue tension afterwards.  From eval: Patient is a 53 y.o. left hand dominant malewho was seen today for physical therapy evaluation and treatment for cervical radiculopathy.  His primary symptoms are on the L side in C5 dermatome, however he is losing grip strength in left hand, measured today 45lbs on left compared to 75lbs on right.  He also does get radicular symptoms on Right as well and has diminished sensation on R C6 dermatome.  He has significantly limited cervical ROM and pain with all neck movements.  Today given initial HEP and advice on inexpensive cervical traction device, as he did get relief with distraction.  Cervical ESI scheduled for next week.  George Ward  Worst would benefit from skilled physical therapy to decrease pain, centralize symptoms, improve cervical AROM, and improve activity tolerance.   OBJECTIVE IMPAIRMENTS: decreased ROM, decreased strength, hypomobility, increased fascial restrictions, impaired perceived functional ability, increased muscle spasms, impaired tone, and pain.   ACTIVITY LIMITATIONS: carrying, lifting, bending, sleeping, bed mobility, and caring for others  PARTICIPATION LIMITATIONS: meal prep, cleaning, laundry, driving, shopping, community activity, occupation, and yard work  PERSONAL FACTORS: Time since onset of injury/illness/exacerbation and 1-2 comorbidities: HTN, major depressive disorder, migraines,  are also affecting patient's functional outcome.   REHAB POTENTIAL: Good  CLINICAL DECISION MAKING: Evolving/moderate complexity  EVALUATION COMPLEXITY: Moderate   GOALS: Goals reviewed with patient? Yes  SHORT TERM GOALS: Target date: 12/19/2023   Patient will be independent with initial HEP.  Baseline:  Goal status: INITIAL  LONG TERM GOALS: Target date: 01/22/2024   Patient will be independent with advanced/ongoing HEP to improve outcomes and carryover.  Baseline:  Goal status: INITIAL  2.  Patient will report 75% improvement in neck pain to improve QOL.  Baseline: 5-9/10 Goal status: INITIAL  3.  Patient will demonstrate full pain free cervical ROM for safety with driving.  Baseline: see objective Goal status: INITIAL  4.  Patient will report at least 11 points improvement on NDI to demonstrate improved functional ability.  Baseline: 21/50 Goal status: INITIAL  5.  Patient will demonstrate 75% improvement in radicular symptoms.  Baseline: radiates down both arms Goal status: INITIAL  6. Patient will improve Left grip strength to 75lbs.    Baseline: 45 lbs Goal status: INITIAL   PLAN:  PT FREQUENCY: 2x/week  PT DURATION: 8 weeks  PLANNED INTERVENTIONS: 97110-Therapeutic  exercises, 97530- Therapeutic activity, [id number]- Neuromuscular re-education, 97535- Self Care, 04540- Manual therapy, G0283- Electrical stimulation (unattended), [phone]- Electrical stimulation (manual), [id number]- Ultrasound, 14782- Traction (mechanical), Patient/Family education, Balance training, Stair training, Taping, Dry Needling, Joint mobilization, Joint manipulation, Spinal manipulation, Spinal mobilization, Cryotherapy, and Moist heat  PLAN FOR NEXT SESSION: review and progress HEP, manual therapy, try cervical traction   Darleene Cleaver, PTA 12/16/2023, 3:24 PM

## 2023-12-18 ENCOUNTER — Ambulatory Visit: Admitting: Physical Therapy

## 2023-12-18 DIAGNOSIS — M5412 Radiculopathy, cervical region: Secondary | ICD-10-CM

## 2023-12-18 DIAGNOSIS — R252 Cramp and spasm: Secondary | ICD-10-CM

## 2023-12-18 DIAGNOSIS — M6281 Muscle weakness (generalized): Secondary | ICD-10-CM

## 2023-12-18 NOTE — Therapy (Signed)
 OUTPATIENT PHYSICAL THERAPY TREATMENT   Patient Name: George Ward MRN: [id number] DOB:Jan 17, 1971, 53 y.o., male Today's Date: 12/18/2023  END OF SESSION:  PT End of Session - 12/18/23 1451     Visit Number 4    Date for PT Re-Evaluation 01/22/24    Authorization Type [employer] 2025 VL: [place]    PT Start Time 1451    PT Stop Time 1530    PT Time Calculation (min) 39 min    Activity Tolerance Patient tolerated treatment well    Behavior During Therapy WFL for tasks assessed/performed                Past Medical History:  Diagnosis Date   Alcohol abuse    Remission >20 years ago   Allergy    Anxiety    Asthma    Cancer (HCC)    Depression    MAJOR DEPRESSIVE DISORDER   Depression with anxiety    Drug abuse (HCC)    Remission >20 years ago   Environmental allergies    History of chicken pox    Hyperlipidemia    Hypertension    Hypoalphalipoproteinemia, familial    Lumbar pain    Migraines    Primary generalized (osteo)arthritis    Seasonal allergies    Past Surgical History:  Procedure Laterality Date   EXCISION MASS HEAD  03/17/2018   excision cells on head  ; still in testing for cancer per patient report    LUMBAR EPIDURAL INJECTION     SHOULDER ARTHROSCOPY Left 2021   UMBILICAL HERNIA REPAIR N/A 04/02/2018   Procedure: OPEN UMBILICAL HERNIA REPAIR WITH INSERTION OF MESH;  Surgeon: Andria Meuse, MD;  Location: [facility];  Service: General;  Laterality: N/A;   WISDOM TOOTH EXTRACTION  1989-1990   Patient Active Problem List   Diagnosis Date Noted   Asthma 09/18/2022   Coronary artery calcification 12/13/2021   Dyspnea on exertion 12/13/2021   Dyslipidemia 12/13/2021   Essential hypertension 12/13/2021   Migraine 03/08/2021   Impulse control disorder in adult 03/06/2017   MDD (major depressive disorder) 03/06/2017    PCP: Marisue Brooklyn   REFERRING PROVIDER: Joan Flores, PA-C  REFERRING DIAG: M50.10 (ICD-10-CM) -  Cervical disc disorder with radiculopathy of cervical region   THERAPY DIAG:  No diagnosis found.  Rationale for Evaluation and Treatment: Rehabilitation  ONSET DATE: December 2024  SUBJECTIVE:  SUBJECTIVE STATEMENT: Pt states pain has been better controlled. "A little stiff." Has not been having any N/T. Has not noticed as much N/T while side sleeping after figuring out his repositioning. Has been sleeping better with less waking up. Also notes taking less medication only in morning and at night.   From eval: Neck hurts most of the time, more on the left and back than right, moves certain way it all starts to go numb down my shoulder to elbow, sometimes does get tingling and numbness down right side to fingers but not as frequent or intense, difficult to turn to left.  I don't have a car, but its very difficult to drive wifes car, so haven't been driving.  Hard to lift, that makes it way worse.  Sleeping is an issue, I'm a side sleeping, I have to position pillow just right so pain and numbness don't get work.   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    Hand dominance: Ambidextrous write with Left hand  PERTINENT HISTORY:  From MD notes on 11/11/23 "[name] is here today with a chief complaint of neck pain.  He states that when he turns his head a certain way he has shooting pain as well as numbness and tingling that radiates down to his left elbow.  He notices that his right upper extremity the neck pain radiates down to his first 2 digits.  This has been ongoing for 3 months when he fell while looking after a large dog.   He feels as though the numbness in the range of his pain is larger.  He was given a dose of steroids as well as Tylenol, Octagam, gabapentin, and using home exercises however has continued with pain.  Working at the computer and sleeping makes it worse.  He also has quite a bit of midline neck pain.  He denies any weakness in his upper extremities.  He has had no issues with fine motor skills or dropping things.  He also denies any changes to his gait or issues with walking."   PMH: HTN, major depressive disorder, migraines, LBP  PAIN:  Are you having pain? Yes: NPRS scale: 0/10 Pain location: left side neck radiating down L shoulder, with numbness and tingling in hands Pain description: aching, radiating Aggravating factors: lifting, turning head Relieving factors: gabapentin  PRECAUTIONS: None  RED FLAGS: None     WEIGHT BEARING RESTRICTIONS: No  FALLS:  Has patient fallen in last 6 months? Yes. Number of falls 1- tripped over large dog  LIVING ENVIRONMENT: Lives with: lives with their family Lives in: House/apartment Stairs: Yes: Internal: 14 steps; on left going up and External: 1 steps; none - stairs at work as well, teaches on second floor Has following equipment at home: None  OCCUPATION: Runner, broadcasting/film/video, SW middle  PLOF: Independent  Leisure: walking to work daily, coaches track and volleyball  PATIENT GOALS: "to see if there is anything can do about neck pain and numbness"  NEXT MD VISIT: has epidural scheduled for neck on 12/03/2023  OBJECTIVE:   DIAGNOSTIC FINDINGS:  MRI cervical spine 11/04/23:   IMPRESSION: 1. Multilevel cervical spondylosis as described above. Moderate spinal canal stenosis and moderate to severe bilateral neuroforaminal stenosis at C3-C4. 2. Mild spinal canal stenosis with moderate to severe  left neuroforaminal stenosis at C4-C5. 3. Mild spinal canal stenosis with severe right and moderate left neuroforaminal stenosis at C5-C6. 4. Moderate right  neuroforaminal stenosis at T1-T2.  PATIENT SURVEYS:  NDI 21/50  COGNITION: Overall cognitive status: Within functional limits for tasks assessed  SENSATION: Light touch: Impaired  Diminished along R C6 dermatome, but reports numbness and tingling both arms, worse on L side especially in C5 dermatome.    POSTURE: No Significant postural limitations  PALPATION: Tenderness cervical paraspinals, L scalenes, L 1st rib.  Palpation of muscles on L reproduced radicular symptoms in L arm.    CERVICAL ROM:   Active ROM A/PROM (deg) eval AROM 12/18/23  Flexion 28 35  Extension 18 37  Right lateral flexion 25 35  Left lateral flexion 25 38  Right rotation 44 60  Left rotation 42 50 improved to 60 after TPDN   (Blank rows = not tested)  UPPER EXTREMITY ROM:  WNL, symmetric and pain free.    UPPER EXTREMITY MMT:  MMT Right eval Left eval  Shoulder flexion 5 5  Shoulder abduction 5 5  Shoulder internal rotation 5 5  Shoulder external rotation 5 5  Elbow flexion 5 5  Elbow extension 5 5  Wrist flexion 5 5  Wrist extension 5 5  Grip strength 75 45   (Blank rows = not tested)  CERVICAL SPECIAL TESTS:  Spurling's test: Positive and Distraction test: Positive  FUNCTIONAL TESTS:  NT  TODAY'S TREATMENT:                                                                                                                              DATE:  12/18/23 UBE L3; 3 min fwd and 3 min bwd Doorway pec stretch 60 deg and 90 deg of abd x 30" each Rechecked cervical ROM Trigger Point Dry Needling  Initial Treatment: Pt instructed on Dry Needling rational, procedures, and possible side effects. Pt instructed to expect mild to moderate muscle soreness later in the day and/or into the next day.  Pt instructed in methods to reduce muscle soreness. Pt instructed to continue prescribed HEP. Patient was educated on signs and symptoms of infection and other risk factors and advised to seek medical  attention should they occur.  Patient verbalized understanding of these instructions and education.   Patient Verbal Consent Given: Yes Education Handout Provided: Yes Muscles Treated: L UT and L post scalene Electrical Stimulation Performed: No Treatment Response/Outcome: Twitch response, decreased muscle tension/increased ROM  UT stretch x 30" Levator scap stretch x 30"  Cervical rotation with pillowcase x30" Cervical ext with pillowcase x30" Median nerve glide x10 Ulnar nerve glide x10 Radial nerve glide x10 Grip squeeze 2x10 Standing shoulder ER green TB 2x10 "W" green TB 2x10   12/16/23 UBE L1.0 3 min fwd and 3 min back Cervical extension w/ pillowcase 10x3" Cervical rotation w/ pillowcase 10x3" bil Supine chin tucks 2x10 Supine chin tucks + cervical rotation 10x3"  bil Supine horizontal ABD RTB X 10  Supine ER RTB x 10  Thread the needle Seated lumbar flexion stretch green pball  Manual therapy: STM to bil UT, LS, scalenes  12/11/23 Seated levator scap stretch x30" UT stretch x30" Anterior scalene stretch x30" Doorway pec stretch 60 deg and 90 deg of abd x 30" each Cervical traction: 17# max pull, 15 sec rest, 60 sec hold x 10 min total Supine cervical retraction x10 Supine deep neck flexor activation x10 Supine cervical retraction + rotation x10 Self care: positions to avoid at rest/sitting and sleeping  11/27/23 EVAL Self Care: Findings, POC, self traction devices - inexpensive options available on [employer], imaging, red flag symptoms, advice on placement of TENS electrodes, heat v. cold.  Therapeutic Exercise: to improve strength and mobility.  Demo, verbal and tactile cues throughout for technique.  Seated Cervical Retraction  - 10 reps - Seated Shoulder Rolls   - 10 reps - First Rib Mobilization with Strap   - 10 reps  PATIENT EDUCATION:  Education details: see above Person educated: Patient Education method: Explanation, Demonstration, Verbal cues, and  Handouts Education comprehension: verbalized understanding and returned demonstration  HOME EXERCISE PROGRAM: Access Code: [email or web address] URL: [email or web address] Date: 12/18/2023 Prepared by: Vernon Prey April Kirstie Peri  Exercises - Seated Cervical Retraction  - 3 x daily - 7 x weekly - 1 sets - 10 reps - Seated Shoulder Rolls  - 3 x daily - 7 x weekly - 1 sets - 10 reps - First Rib Mobilization with Strap  - 1 x daily - 7 x weekly - 1 sets - 10 reps - Supine Head Nod with Deep Neck Flexor Activation  - 1 x daily - 7 x weekly - 2 sets - 10 reps - Supine Deep Neck Flexor Training - Hold  - 1 x daily - 7 x weekly - 2 sets - 10 reps - Shoulder External Rotation and Scapular Retraction with Resistance  - 1 x daily - 7 x weekly - 2 sets - 10 reps - Shoulder W - External Rotation with Resistance  - 1 x daily - 7 x weekly - 2 sets - 10 reps  ASSESSMENT:  CLINICAL IMPRESSION: Performed trial of TPDN this session to address his left side neck tightness. Pain and N/T have been well controlled. Worked on postural stability to decrease UT tension. Pt with increasing cervical ROM in general.   From eval: Patient is a 53 y.o. left hand dominant malewho was seen today for physical therapy evaluation and treatment for cervical radiculopathy.  His primary symptoms are on the L side in C5 dermatome, however he is losing grip strength in left hand, measured today 45lbs on left compared to 75lbs on right.  He also does get radicular symptoms on Right as well and has diminished sensation on R C6 dermatome.  He has significantly limited cervical ROM and pain with all neck movements.  Today given initial HEP and advice on inexpensive cervical traction device, as he did get relief with distraction.  Cervical ESI scheduled for next week.  Judith Blonder Salay would benefit from skilled physical therapy to decrease pain, centralize symptoms, improve cervical AROM, and improve activity tolerance.   OBJECTIVE  IMPAIRMENTS: decreased ROM, decreased strength, hypomobility, increased fascial restrictions, impaired perceived functional ability, increased muscle spasms, impaired tone, and pain.   ACTIVITY LIMITATIONS: carrying, lifting, bending, sleeping, bed mobility, and caring for others  PARTICIPATION LIMITATIONS: meal prep, cleaning, laundry, driving, shopping, community activity, occupation, and yard  work  PERSONAL FACTORS: Time since onset of injury/illness/exacerbation and 1-2 comorbidities: HTN, major depressive disorder, migraines,  are also affecting patient's functional outcome.   REHAB POTENTIAL: Good  CLINICAL DECISION MAKING: Evolving/moderate complexity  EVALUATION COMPLEXITY: Moderate   GOALS: Goals reviewed with patient? Yes  SHORT TERM GOALS: Target date: 12/19/2023   Patient will be independent with initial HEP.  Baseline:  Goal status: INITIAL  LONG TERM GOALS: Target date: 01/22/2024   Patient will be independent with advanced/ongoing HEP to improve outcomes and carryover.  Baseline:  Goal status: INITIAL  2.  Patient will report 75% improvement in neck pain to improve QOL.  Baseline: 5-9/10 Goal status: INITIAL  3.  Patient will demonstrate full pain free cervical ROM for safety with driving.  Baseline: see objective Goal status: INITIAL  4.  Patient will report at least 11 points improvement on NDI to demonstrate improved functional ability.  Baseline: 21/50 Goal status: INITIAL  5.  Patient will demonstrate 75% improvement in radicular symptoms.  Baseline: radiates down both arms Goal status: INITIAL  6. Patient will improve Left grip strength to 75lbs.    Baseline: 45 lbs Goal status: INITIAL   PLAN:  PT FREQUENCY: 2x/week  PT DURATION: 8 weeks  PLANNED INTERVENTIONS: 97110-Therapeutic exercises, 97530- Therapeutic activity, 97112- Neuromuscular re-education, 97535- Self Care, [id number]- Manual therapy, [id number]- Electrical stimulation (unattended),  [phone]- Electrical stimulation (manual), [id number]- Ultrasound, [id number]- Traction (mechanical), Patient/Family education, Balance training, Stair training, Taping, Dry Needling, Joint mobilization, Joint manipulation, Spinal manipulation, Spinal mobilization, Cryotherapy, and Moist heat  PLAN FOR NEXT SESSION: review and progress HEP, manual therapy, try cervical traction   Nillie Bartolotta April Ma L Seirra Kos, PT 12/18/2023, 2:51 PM

## 2023-12-18 NOTE — Therapy (Deleted)
 OUTPATIENT PHYSICAL THERAPY TREATMENT   Patient Name: George Ward MRN: [id number] DOB:1971/05/11, 53 y.o., male Today's Date: 12/18/2023  END OF SESSION:      Past Medical History:  Diagnosis Date   Alcohol abuse    Remission >20 years ago   Allergy    Anxiety    Asthma    Cancer (HCC)    Depression    MAJOR DEPRESSIVE DISORDER   Depression with anxiety    Drug abuse (HCC)    Remission >20 years ago   Environmental allergies    History of chicken pox    Hyperlipidemia    Hypertension    Hypoalphalipoproteinemia, familial    Lumbar pain    Migraines    Primary generalized (osteo)arthritis    Seasonal allergies    Past Surgical History:  Procedure Laterality Date   EXCISION MASS HEAD  03/17/2018   excision cells on head  ; still in testing for cancer per patient report    LUMBAR EPIDURAL INJECTION     SHOULDER ARTHROSCOPY Left 2021   UMBILICAL HERNIA REPAIR N/A 04/02/2018   Procedure: OPEN UMBILICAL HERNIA REPAIR WITH INSERTION OF MESH;  Surgeon: Andria Meuse, MD;  Location: [facility];  Service: General;  Laterality: N/A;   WISDOM TOOTH EXTRACTION  1989-1990   Patient Active Problem List   Diagnosis Date Noted   Asthma 09/18/2022   Coronary artery calcification 12/13/2021   Dyspnea on exertion 12/13/2021   Dyslipidemia 12/13/2021   Essential hypertension 12/13/2021   Migraine 03/08/2021   Impulse control disorder in adult 03/06/2017   MDD (major depressive disorder) 03/06/2017    PCP: Marisue Brooklyn   REFERRING PROVIDER: Joan Flores, PA-C  REFERRING DIAG: M50.10 (ICD-10-CM) - Cervical disc disorder with radiculopathy of cervical region   THERAPY DIAG:  No diagnosis found.  Rationale for Evaluation and Treatment: Rehabilitation  ONSET DATE: December 2024  SUBJECTIVE:                                                                                                                                                                                                          SUBJECTIVE STATEMENT: *** Doing good today, a little stiffness  From eval: Neck hurts most of the time, more on the left and back than right, moves certain way it all starts to go numb down my shoulder to elbow, sometimes does get tingling and numbness down right side to fingers but not as frequent or intense, difficult to turn to left.  I don't have a car, but its very difficult  to drive wifes car, so haven't been driving.  Hard to lift, that makes it way worse.  Sleeping is an issue, I'm a side sleeping, I have to position pillow just right so pain and numbness don't get work.   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    Hand dominance: Ambidextrous write with Left hand  PERTINENT HISTORY:  From MD notes on 11/11/23 "[name] is here today with a chief complaint of neck pain.  He states that when he turns his head a certain way he has shooting pain as well as numbness and tingling that radiates down to his left elbow.  He notices that his right upper extremity the neck pain radiates down to his first 2 digits.  This has been ongoing for 3 months when he fell while looking after a large dog.  He feels as though the numbness in the range of his pain is larger.  He was given a dose of steroids as well as Tylenol, Octagam, gabapentin, and using home exercises however has continued with pain.  Working at the computer and sleeping makes it worse.  He also has quite a bit of midline neck pain.  He denies any weakness in his upper extremities.  He has had no issues with fine motor skills or dropping things.  He also denies any changes to his gait or issues with walking."   PMH: HTN, major depressive disorder, migraines, LBP  PAIN:  Are you having pain? Yes: NPRS scale: 2 or 3/10 Pain location:  left side neck radiating down L shoulder, with numbness and tingling in hands Pain description: aching, radiating Aggravating factors: lifting, turning head Relieving factors: gabapentin  PRECAUTIONS: None  RED FLAGS: None     WEIGHT BEARING RESTRICTIONS: No  FALLS:  Has patient fallen in last 6 months? Yes. Number of falls 1- tripped over large dog  LIVING ENVIRONMENT: Lives with: lives with their family Lives in: House/apartment Stairs: Yes: Internal: 14 steps; on left going up and External: 1 steps; none - stairs at work as well, teaches on second floor Has following equipment at home: None  OCCUPATION: Runner, broadcasting/film/video, SW middle  PLOF: Independent  Leisure: walking to work daily, coaches track and volleyball  PATIENT GOALS: "to see if there is anything can do about neck pain and numbness"  NEXT MD VISIT: has epidural scheduled for neck on 12/03/2023  OBJECTIVE:   DIAGNOSTIC FINDINGS:  MRI cervical spine 11/04/23:   IMPRESSION: 1. Multilevel cervical spondylosis as described above. Moderate spinal canal stenosis and moderate to severe bilateral neuroforaminal stenosis at C3-C4. 2. Mild spinal canal stenosis with moderate to severe left neuroforaminal stenosis at C4-C5. 3. Mild spinal canal stenosis with severe right and moderate left neuroforaminal stenosis at C5-C6. 4. Moderate right neuroforaminal stenosis at T1-T2.  PATIENT SURVEYS:  NDI 21/50  COGNITION: Overall cognitive status: Within functional limits for tasks assessed  SENSATION: Light touch: Impaired  Diminished along R C6 dermatome, but reports numbness and tingling both arms, worse on L side especially in C5 dermatome.    POSTURE: No Significant postural limitations  PALPATION: Tenderness cervical paraspinals, L scalenes, L 1st rib.  Palpation of muscles on L reproduced radicular symptoms in L arm.    CERVICAL ROM:   Active ROM A/PROM (deg) eval  Flexion 28  Extension 18  Right lateral flexion 25   Left lateral flexion 25  Right rotation 44  Left rotation 42   (Blank rows = not tested)  UPPER EXTREMITY ROM:  WNL, symmetric and pain free.    UPPER EXTREMITY MMT:  MMT Right eval Left eval  Shoulder flexion 5 5  Shoulder abduction 5 5  Shoulder internal rotation 5 5  Shoulder external rotation 5 5  Elbow flexion 5 5  Elbow extension 5 5  Wrist flexion 5 5  Wrist extension 5 5  Grip strength 75 45   (Blank rows = not tested)  CERVICAL SPECIAL TESTS:  Spurling's test: Positive and Distraction test: Positive  FUNCTIONAL TESTS:  NT  TODAY'S TREATMENT:                                                                                                                              DATE:  12/18/23 ***  12/16/23 UBE L1.0 3 min fwd and 3 min back Cervical extension w/ pillowcase 10x3" Cervical rotation w/ pillowcase 10x3" bil Supine chin tucks 2x10 Supine chin tucks + cervical rotation 10x3" bil Supine horizontal ABD RTB X 10  Supine ER RTB x 10  Thread the needle Seated lumbar flexion stretch green pball  Manual therapy: STM to bil UT, LS, scalenes  12/11/23 Seated levator scap stretch x30" UT stretch x30" Anterior scalene stretch x30" Doorway pec stretch 60 deg and 90 deg of abd x 30" each Cervical traction: 17# max pull, 15 sec rest, 60 sec hold x 10 min total Supine cervical retraction x10 Supine deep neck flexor activation x10 Supine cervical retraction + rotation x10 Self care: positions to avoid at rest/sitting and sleeping  11/27/23 EVAL Self Care: Findings, POC, self traction devices - inexpensive options available on [employer], imaging, red flag symptoms, advice on placement of TENS electrodes, heat v. cold.  Therapeutic Exercise: to improve strength and mobility.  Demo, verbal and tactile cues throughout for technique.  Seated Cervical Retraction  - 10 reps - Seated Shoulder Rolls   - 10 reps - First Rib Mobilization with Strap   - 10 reps  PATIENT  EDUCATION:  Education details: see above Person educated: Patient Education method: Explanation, Demonstration, Verbal cues, and Handouts Education comprehension: verbalized understanding and returned demonstration  HOME EXERCISE PROGRAM: Access Code: [email or web address] URL: [email or web address] Date: 11/28/2023 Prepared by: Harrie Foreman  Exercises - Seated Cervical Retraction  - 3 x daily - 7 x weekly - 1 sets - 10 reps - Seated Shoulder Rolls  - 3 x daily - 7 x weekly - 1 sets - 10 reps - First Rib Mobilization with Strap  - 1 x daily - 7 x weekly - 1 sets - 10 reps  ASSESSMENT:  CLINICAL IMPRESSION: *** Pt arrived with no reports of pain today, just stiffness. He tolerated the progression of exercises well. We did MT to address the soft tissue restrictions in both sides  of his neck, very well improved tissue tension afterwards.  From eval: Patient is a 54 y.o. left hand dominant malewho was seen today for physical therapy evaluation and treatment for cervical radiculopathy.  His primary symptoms are on the L side in C5 dermatome, however he is losing grip strength in left hand, measured today 45lbs on left compared to 75lbs on right.  He also does get radicular symptoms on Right as well and has diminished sensation on R C6 dermatome.  He has significantly limited cervical ROM and pain with all neck movements.  Today given initial HEP and advice on inexpensive cervical traction device, as he did get relief with distraction.  Cervical ESI scheduled for next week.  Judith Blonder Porro would benefit from skilled physical therapy to decrease pain, centralize symptoms, improve cervical AROM, and improve activity tolerance.   OBJECTIVE IMPAIRMENTS: decreased ROM, decreased strength, hypomobility, increased fascial restrictions, impaired perceived functional ability, increased muscle spasms, impaired tone, and pain.   ACTIVITY LIMITATIONS: carrying, lifting, bending, sleeping, bed  mobility, and caring for others  PARTICIPATION LIMITATIONS: meal prep, cleaning, laundry, driving, shopping, community activity, occupation, and yard work  PERSONAL FACTORS: Time since onset of injury/illness/exacerbation and 1-2 comorbidities: HTN, major depressive disorder, migraines,  are also affecting patient's functional outcome.   REHAB POTENTIAL: Good  CLINICAL DECISION MAKING: Evolving/moderate complexity  EVALUATION COMPLEXITY: Moderate   GOALS: Goals reviewed with patient? Yes  SHORT TERM GOALS: Target date: 12/19/2023   Patient will be independent with initial HEP.  Baseline:  Goal status: INITIAL  LONG TERM GOALS: Target date: 01/22/2024   Patient will be independent with advanced/ongoing HEP to improve outcomes and carryover.  Baseline:  Goal status: INITIAL  2.  Patient will report 75% improvement in neck pain to improve QOL.  Baseline: 5-9/10 Goal status: INITIAL  3.  Patient will demonstrate full pain free cervical ROM for safety with driving.  Baseline: see objective Goal status: INITIAL  4.  Patient will report at least 11 points improvement on NDI to demonstrate improved functional ability.  Baseline: 21/50 Goal status: INITIAL  5.  Patient will demonstrate 75% improvement in radicular symptoms.  Baseline: radiates down both arms Goal status: INITIAL  6. Patient will improve Left grip strength to 75lbs.    Baseline: 45 lbs Goal status: INITIAL   PLAN:  PT FREQUENCY: 2x/week  PT DURATION: 8 weeks  PLANNED INTERVENTIONS: 97110-Therapeutic exercises, 97530- Therapeutic activity, 97112- Neuromuscular re-education, 97535- Self Care, 13086- Manual therapy, [id number]- Electrical stimulation (unattended), [phone]- Electrical stimulation (manual), [id number]- Ultrasound, [id number]- Traction (mechanical), Patient/Family education, Balance training, Stair training, Taping, Dry Needling, Joint mobilization, Joint manipulation, Spinal manipulation, Spinal mobilization,  Cryotherapy, and Moist heat  PLAN FOR NEXT SESSION: review and progress HEP, manual therapy, try cervical traction   Joey Lierman April Ma L Nawal Burling, PT 12/18/2023, 1:55 PM

## 2023-12-20 ENCOUNTER — Ambulatory Visit (HOSPITAL_BASED_OUTPATIENT_CLINIC_OR_DEPARTMENT_OTHER)
Admission: RE | Admit: 2023-12-20 | Discharge: 2023-12-20 | Disposition: A | Discharge status: Home / Self Care | Source: Ambulatory Visit | Attending: Medical | Admitting: Medical

## 2023-12-20 ENCOUNTER — Encounter: Payer: Self-pay | Admitting: Medical

## 2023-12-20 ENCOUNTER — Ambulatory Visit: Admitting: Medical

## 2023-12-20 VITALS — BP 116/79 | HR 73 | Ht 70.0 in | Wt 196.4 lb

## 2023-12-20 DIAGNOSIS — M79672 Pain in left foot: Secondary | ICD-10-CM | POA: Diagnosis present

## 2023-12-20 NOTE — Patient Instructions (Signed)
 Left foot pain Acute pain post-trauma with differential of bruising, strain, or fracture. Managed with elevation, anti-inflammatories, and icing. - Order X-ray of the left foot to assess for fracture. - Provide a four-inch A strap for cushioning and support. - Advise combination of Tylenol 325 mg with Ibuprofen 200 mg for pain management. - Consider referral to a podiatrist if pain does not improve significantly, regardless of X-ray results.   Follow up date to be determined after imaging review

## 2023-12-20 NOTE — Progress Notes (Signed)
   Subjective:    Patient ID: George Ward, male    DOB: 1970-11-29, 53 y.o.   MRN: [id number]  HPI  Discussed the use of AI scribe software for clinical note transcription with the patient, who gave verbal consent to proceed.  History of Present Illness   George Ward is a 53 year old male who presents with left foot pain after an injury. He is accompanied by his wife.  He experienced left foot pain beginning on Monday evening after stepping on a dog toy, which caused his foot to bend and twist. The pain is severe, persistent, and exacerbated by walking, especially when barefoot, making it difficult for him to walk without discomfort.  He describes a sensation of 'popping' in the foot when walking, which is concerning to him. Despite wearing shoes, which makes walking more tolerable, the pain persists.  He has been managing the pain by keeping his foot elevated, taking anti-inflammatory medications, and icing it. He also wears double socks on the affected foot to provide additional support and reduce pain.         Review of Systems  See hpi    Objective:   Physical Exam  General- no acute distress.  Left ankle- no ankle pain on rom or palpation. Left foot- no heel pain. Mid foot to metarsal area tender to palpation bottom and top aspect. No toe pain. No swelling or bruising         Assessment & Plan:   Assessment and Plan    Left foot pain Acute pain post-trauma with differential of bruising, strain, or fracture. Managed with elevation, anti-inflammatories, and icing. - Order X-ray of the left foot to assess for fracture. - Provide a four-inch A strap for cushioning and support. - Advise combination of Tylenol 325 mg with Ibuprofen 200 mg for pain management. - Consider referral to a podiatrist if pain does not improve significantly, regardless of X-ray results.   Follow up date to be determined after imaging review

## 2023-12-21 ENCOUNTER — Encounter: Payer: Self-pay | Admitting: Medical

## 2023-12-23 ENCOUNTER — Ambulatory Visit

## 2023-12-23 DIAGNOSIS — M5412 Radiculopathy, cervical region: Secondary | ICD-10-CM | POA: Diagnosis not present

## 2023-12-23 DIAGNOSIS — R252 Cramp and spasm: Secondary | ICD-10-CM

## 2023-12-23 DIAGNOSIS — M6281 Muscle weakness (generalized): Secondary | ICD-10-CM

## 2023-12-23 NOTE — Therapy (Signed)
 OUTPATIENT PHYSICAL THERAPY TREATMENT   Patient Name: George Ward MRN: [id number] DOB:[birth year], 53 y.o., male Today's Date: 12/23/2023  END OF SESSION:  PT End of Session - 12/23/23 1455     Visit Number 5    Date for PT Re-Evaluation 01/22/24    Authorization Type [employer] 2025 VL: [place]    PT Start Time 1448    PT Stop Time 1527    PT Time Calculation (min) 39 min    Activity Tolerance Patient tolerated treatment well    Behavior During Therapy WFL for tasks assessed/performed                 Past Medical History:  Diagnosis Date   Alcohol abuse    Remission >20 years ago   Allergy    Anxiety    Asthma    Cancer (HCC)    Depression    MAJOR DEPRESSIVE DISORDER   Depression with anxiety    Drug abuse (HCC)    Remission >20 years ago   Environmental allergies    History of chicken pox    Hyperlipidemia    Hypertension    Hypoalphalipoproteinemia, familial    Lumbar pain    Migraines    Primary generalized (osteo)arthritis    Seasonal allergies    Past Surgical History:  Procedure Laterality Date   EXCISION MASS HEAD  03/17/2018   excision cells on head  ; still in testing for cancer per patient report    LUMBAR EPIDURAL INJECTION     SHOULDER ARTHROSCOPY Left 2021   UMBILICAL HERNIA REPAIR N/A 04/02/2018   Procedure: OPEN UMBILICAL HERNIA REPAIR WITH INSERTION OF MESH;  Surgeon: Melvenia Stabs, MD;  Location: [facility];  Service: General;  Laterality: N/A;   WISDOM TOOTH EXTRACTION  1989-1990   Patient Active Problem List   Diagnosis Date Noted   Asthma 09/18/2022   Coronary artery calcification 12/13/2021   Dyspnea on exertion 12/13/2021   Dyslipidemia 12/13/2021   Essential hypertension 12/13/2021   Migraine 03/08/2021   Impulse control disorder in adult 03/06/2017   MDD (major depressive disorder) 03/06/2017    PCP: Francine Iron   REFERRING PROVIDER: Ludwig Safer, PA-C  REFERRING DIAG: M50.10 (ICD-10-CM) -  Cervical disc disorder with radiculopathy of cervical region   THERAPY DIAG:  Radiculopathy, cervical region  Cramp and spasm  Muscle weakness (generalized)  Rationale for Evaluation and Treatment: Rehabilitation  ONSET DATE: December 2024  SUBJECTIVE:  SUBJECTIVE STATEMENT: Pt reports no new complaints  From eval: Neck hurts most of the time, more on the left and back than right, moves certain way it all starts to go numb down my shoulder to elbow, sometimes does get tingling and numbness down right side to fingers but not as frequent or intense, difficult to turn to left.  I don't have a car, but its very difficult to drive wifes car, so haven't been driving.  Hard to lift, that makes it way worse.  Sleeping is an issue, I'm a side sleeping, I have to position pillow just right so pain and numbness don't get work.   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    Hand dominance: Ambidextrous write with Left hand  PERTINENT HISTORY:  From MD notes on 11/11/23 "[name] is here today with a chief complaint of neck pain.  He states that when he turns his head a certain way he has shooting pain as well as numbness and tingling that radiates down to his left elbow.  He notices that his right upper extremity the neck pain radiates down to his first 2 digits.  This has been ongoing for 3 months when he fell while looking after a large dog.  He feels as though the numbness in the range of his pain is larger.  He was given a dose of steroids as well as Tylenol, Octagam, gabapentin, and using home exercises however has continued with  pain.  Working at the computer and sleeping makes it worse.  He also has quite a bit of midline neck pain.  He denies any weakness in his upper extremities.  He has had no issues with fine motor skills or dropping things.  He also denies any changes to his gait or issues with walking."   PMH: HTN, major depressive disorder, migraines, LBP  PAIN:  Are you having pain? Yes: NPRS scale: 0/10 Pain location: left side neck radiating down L shoulder, with numbness and tingling in hands Pain description: aching, radiating Aggravating factors: lifting, turning head Relieving factors: gabapentin  PRECAUTIONS: None  RED FLAGS: None     WEIGHT BEARING RESTRICTIONS: No  FALLS:  Has patient fallen in last 6 months? Yes. Number of falls 1- tripped over large dog  LIVING ENVIRONMENT: Lives with: lives with their family Lives in: House/apartment Stairs: Yes: Internal: 14 steps; on left going up and External: 1 steps; none - stairs at work as well, teaches on second floor Has following equipment at home: None  OCCUPATION: Runner, broadcasting/film/video, SW middle  PLOF: Independent  Leisure: walking to work daily, coaches track and volleyball  PATIENT GOALS: "to see if there is anything can do about neck pain and numbness"  NEXT MD VISIT: has epidural scheduled for neck on 12/03/2023  OBJECTIVE:   DIAGNOSTIC FINDINGS:  MRI cervical spine 11/04/23:   IMPRESSION: 1. Multilevel cervical spondylosis as described above. Moderate spinal canal stenosis and moderate to severe bilateral neuroforaminal stenosis at C3-C4. 2. Mild spinal canal stenosis with moderate to severe left neuroforaminal stenosis at C4-C5. 3. Mild spinal canal stenosis with severe right and moderate left neuroforaminal stenosis at C5-C6. 4. Moderate right neuroforaminal stenosis at T1-T2.  PATIENT SURVEYS:  NDI 21/50  COGNITION: Overall cognitive status: Within functional limits for tasks assessed  SENSATION: Light touch: Impaired   Diminished along R C6 dermatome, but reports numbness and tingling both arms, worse on L side especially in C5 dermatome.    POSTURE: No Significant postural limitations  PALPATION: Tenderness cervical paraspinals, L scalenes, L 1st rib.  Palpation of muscles on L reproduced radicular symptoms in L arm.    CERVICAL ROM:   Active ROM A/PROM (deg) eval AROM 12/18/23  Flexion 28 35  Extension 18 37  Right lateral flexion 25 35  Left lateral flexion 25 38  Right rotation 44 60  Left rotation 42 50 improved to 60 after TPDN   (Blank rows = not tested)  UPPER EXTREMITY ROM:  WNL, symmetric and pain free.    UPPER EXTREMITY MMT:  MMT Right eval Left eval  Shoulder flexion 5 5  Shoulder abduction 5 5  Shoulder internal rotation 5 5  Shoulder external rotation 5 5  Elbow flexion 5 5  Elbow extension 5 5  Wrist flexion 5 5  Wrist extension 5 5  Grip strength 75 45   (Blank rows = not tested)  CERVICAL SPECIAL TESTS:  Spurling's test: Positive and Distraction test: Positive  FUNCTIONAL TESTS:  NT  TODAY'S TREATMENT:                                                                                                                              DATE:  12/23/23 UBE L2; 3 min fwd and 3 min bwd Doorway pec stretch 2x30" UT stretch x 30" Levator scap stretch x 30" Horizontal ABD GTB back to doorframe x 10 B ER GTB back to doorframe 2 x 10 Standing shoulder flexion 2lb x 10  Standing shoulder abduction 2lb x 10 CW/CCW circles on wall Standing rows 20lb 2x10  12/18/23 UBE L3; 3 min fwd and 3 min bwd Doorway pec stretch 60 deg and 90 deg of abd x 30" each Rechecked cervical ROM Trigger Point Dry Needling  Initial Treatment: Pt instructed on Dry Needling rational, procedures, and possible side effects. Pt instructed to expect mild to moderate muscle soreness later in the day and/or into the next day.  Pt instructed in methods to reduce muscle soreness. Pt instructed to  continue prescribed HEP. Patient was educated on signs and symptoms of infection and other risk factors and advised to seek medical attention should they occur.  Patient verbalized understanding of these instructions and education.   Patient Verbal Consent Given: Yes Education Handout Provided: Yes Muscles Treated: L UT and L post scalene Electrical Stimulation Performed: No Treatment Response/Outcome: Twitch response, decreased muscle tension/increased ROM  UT stretch x 30" Levator scap stretch x 30"  Cervical rotation with pillowcase x30" Cervical ext with pillowcase x30" Median nerve glide x10 Ulnar nerve glide x10 Radial nerve glide x10 Grip squeeze 2x10 Standing shoulder ER green TB 2x10 "W" green TB 2x10   12/16/23 UBE L1.0 3 min fwd and 3 min back Cervical extension w/ pillowcase 10x3"  Cervical rotation w/ pillowcase 10x3" bil Supine chin tucks 2x10 Supine chin tucks + cervical rotation 10x3" bil Supine horizontal ABD RTB X 10  Supine ER RTB x 10  Thread the needle Seated lumbar flexion stretch green pball  Manual therapy: STM to bil UT, LS, scalenes  12/11/23 Seated levator scap stretch x30" UT stretch x30" Anterior scalene stretch x30" Doorway pec stretch 60 deg and 90 deg of abd x 30" each Cervical traction: 17# max pull, 15 sec rest, 60 sec hold x 10 min total Supine cervical retraction x10 Supine deep neck flexor activation x10 Supine cervical retraction + rotation x10 Self care: positions to avoid at rest/sitting and sleeping  11/27/23 EVAL Self Care: Findings, POC, self traction devices - inexpensive options available on [employer], imaging, red flag symptoms, advice on placement of TENS electrodes, heat v. cold.  Therapeutic Exercise: to improve strength and mobility.  Demo, verbal and tactile cues throughout for technique.  Seated Cervical Retraction  - 10 reps - Seated Shoulder Rolls   - 10 reps - First Rib Mobilization with Strap   - 10 reps  PATIENT  EDUCATION:  Education details: see above Person educated: Patient Education method: Explanation, Demonstration, Verbal cues, and Handouts Education comprehension: verbalized understanding and returned demonstration  HOME EXERCISE PROGRAM: Access Code: [email or web address] URL: [email or web address] Date: 12/23/2023 Prepared by: Chattie Greeson  Exercises - Seated Shoulder Rolls  - 3 x daily - 7 x weekly - 1 sets - 10 reps - First Rib Mobilization with Strap  - 1 x daily - 7 x weekly - 1 sets - 10 reps - Cervical Retraction with Resistance  - 1 x daily - 7 x weekly - 2 sets - 10 reps - Shoulder External Rotation and Scapular Retraction with Resistance  - 1 x daily - 7 x weekly - 2 sets - 10 reps - Shoulder W - External Rotation with Resistance  - 1 x daily - 7 x weekly - 2 sets - 10 reps - Supine Head Nod with Deep Neck Flexor Activation  - 1 x daily - 7 x weekly - 2 sets - 10 reps - Supine Deep Neck Flexor Training - Hold  - 1 x daily - 7 x weekly - 2 sets - 10 reps  ASSESSMENT:  CLINICAL IMPRESSION: Progressed with postural strengthening to improve overall alignment. Good tolerance for the exercises, no pain. Cues provided as needed to correct form. Provided updates for HEP as needed.  From eval: Patient is a 53 y.o. left hand dominant malewho was seen today for physical therapy evaluation and treatment for cervical radiculopathy.  His primary symptoms are on the L side in C5 dermatome, however he is losing grip strength in left hand, measured today 45lbs on left compared to 75lbs on right.  He also does get radicular symptoms on Right as well and has diminished sensation on R C6 dermatome.  He has significantly limited cervical ROM and pain with all neck movements.  Today given initial HEP and advice on inexpensive cervical traction device, as he did get relief with distraction.  Cervical ESI scheduled for next week.  George Ward would benefit from skilled physical therapy to decrease  pain, centralize symptoms, improve cervical AROM, and improve activity tolerance.   OBJECTIVE IMPAIRMENTS: decreased ROM, decreased strength, hypomobility, increased fascial restrictions, impaired perceived functional ability, increased muscle spasms, impaired tone, and pain.   ACTIVITY LIMITATIONS: carrying, lifting, bending, sleeping, bed mobility, and caring for others  PARTICIPATION LIMITATIONS: meal prep, cleaning,  laundry, driving, shopping, community activity, occupation, and yard work  PERSONAL FACTORS: Time since onset of injury/illness/exacerbation and 1-2 comorbidities: HTN, major depressive disorder, migraines,  are also affecting patient's functional outcome.   REHAB POTENTIAL: Good  CLINICAL DECISION MAKING: Evolving/moderate complexity  EVALUATION COMPLEXITY: Moderate   GOALS: Goals reviewed with patient? Yes  SHORT TERM GOALS: Target date: 12/19/2023   Patient will be independent with initial HEP.  Baseline:  Goal status: INITIAL  LONG TERM GOALS: Target date: 01/22/2024   Patient will be independent with advanced/ongoing HEP to improve outcomes and carryover.  Baseline:  Goal status: INITIAL  2.  Patient will report 75% improvement in neck pain to improve QOL.  Baseline: 5-9/10 Goal status: INITIAL  3.  Patient will demonstrate full pain free cervical ROM for safety with driving.  Baseline: see objective Goal status: INITIAL  4.  Patient will report at least 11 points improvement on NDI to demonstrate improved functional ability.  Baseline: 21/50 Goal status: INITIAL  5.  Patient will demonstrate 75% improvement in radicular symptoms.  Baseline: radiates down both arms Goal status: INITIAL  6. Patient will improve Left grip strength to 75lbs.    Baseline: 45 lbs Goal status: INITIAL   PLAN:  PT FREQUENCY: 2x/week  PT DURATION: 8 weeks  PLANNED INTERVENTIONS: 97110-Therapeutic exercises, 97530- Therapeutic activity, [id number]- Neuromuscular  re-education, 97535- Self Care, 16109- Manual therapy, [id number]- Electrical stimulation (unattended), [phone]- Electrical stimulation (manual), [id number]- Ultrasound, 09811- Traction (mechanical), Patient/Family education, Balance training, Stair training, Taping, Dry Needling, Joint mobilization, Joint manipulation, Spinal manipulation, Spinal mobilization, Cryotherapy, and Moist heat  PLAN FOR NEXT SESSION: review and progress HEP, manual therapy, try cervical traction   Samuella Crocker, PTA 12/23/2023, 3:32 PM

## 2023-12-24 ENCOUNTER — Other Ambulatory Visit: Payer: Self-pay | Admitting: Medical

## 2023-12-24 NOTE — Addendum Note (Signed)
 Addended by: Serafina Damme on: 12/24/2023 05:46 PM   Modules accepted: Orders

## 2023-12-25 ENCOUNTER — Ambulatory Visit: Admitting: Physical Therapy

## 2023-12-25 DIAGNOSIS — M5412 Radiculopathy, cervical region: Secondary | ICD-10-CM | POA: Diagnosis not present

## 2023-12-25 DIAGNOSIS — R252 Cramp and spasm: Secondary | ICD-10-CM

## 2023-12-25 DIAGNOSIS — M6281 Muscle weakness (generalized): Secondary | ICD-10-CM

## 2023-12-25 NOTE — Therapy (Addendum)
 OUTPATIENT PHYSICAL THERAPY TREATMENT   Patient Name: George Ward MRN: [id number] DOB:08-04-1971, 53 y.o., male Today's Date: 12/25/2023  END OF SESSION:  PT End of Session - 12/25/23 1452     Visit Number 6    Date for PT Re-Evaluation 01/22/24    Authorization Type [employer] 2025 VL: [place]    PT Start Time 1451    PT Stop Time 1530    PT Time Calculation (min) 39 min    Activity Tolerance Patient tolerated treatment well    Behavior During Therapy WFL for tasks assessed/performed              Past Medical History:  Diagnosis Date   Alcohol abuse    Remission >20 years ago   Allergy    Anxiety    Asthma    Cancer (HCC)    Depression    MAJOR DEPRESSIVE DISORDER   Depression with anxiety    Drug abuse (HCC)    Remission >20 years ago   Environmental allergies    History of chicken pox    Hyperlipidemia    Hypertension    Hypoalphalipoproteinemia, familial    Lumbar pain    Migraines    Primary generalized (osteo)arthritis    Seasonal allergies    Past Surgical History:  Procedure Laterality Date   EXCISION MASS HEAD  03/17/2018   excision cells on head  ; still in testing for cancer per patient report    LUMBAR EPIDURAL INJECTION     SHOULDER ARTHROSCOPY Left 2021   UMBILICAL HERNIA REPAIR N/A 04/02/2018   Procedure: OPEN UMBILICAL HERNIA REPAIR WITH INSERTION OF MESH;  Surgeon: Melvenia Stabs, MD;  Location: [facility];  Service: General;  Laterality: N/A;   WISDOM TOOTH EXTRACTION  1989-1990   Patient Active Problem List   Diagnosis Date Noted   Asthma 09/18/2022   Coronary artery calcification 12/13/2021   Dyspnea on exertion 12/13/2021   Dyslipidemia 12/13/2021   Essential hypertension 12/13/2021   Migraine 03/08/2021   Impulse control disorder in adult 03/06/2017   MDD (major depressive disorder) 03/06/2017    PCP: Francine Iron   REFERRING PROVIDER: Ludwig Safer, PA-C  REFERRING DIAG: M50.10 (ICD-10-CM) -  Cervical disc disorder with radiculopathy of cervical region   THERAPY DIAG:  Radiculopathy, cervical region  Cramp and spasm  Muscle weakness (generalized)  Rationale for Evaluation and Treatment: Rehabilitation  ONSET DATE: December 2024  SUBJECTIVE:  SUBJECTIVE STATEMENT: Pt reports just stiffness. Feels like he has maintained range. N/T is just occasional now. A little sore from progression to green and blue TB.   From eval: Neck hurts most of the time, more on the left and back than right, moves certain way it all starts to go numb down my shoulder to elbow, sometimes does get tingling and numbness down right side to fingers but not as frequent or intense, difficult to turn to left.  I don't have a car, but its very difficult to drive wifes car, so haven't been driving.  Hard to lift, that makes it way worse.  Sleeping is an issue, I'm a side sleeping, I have to position pillow just right so pain and numbness don't get work.   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    Hand dominance: Ambidextrous write with Left hand  PERTINENT HISTORY:  From MD notes on 11/11/23 "Mr. Enzio Buchler is here today with a chief complaint of neck pain.  He states that when he turns his head a certain way he has shooting pain as well as numbness and tingling that radiates down to his left elbow.  He notices that his right upper extremity the neck pain radiates down to his first 2 digits.  This has been ongoing for 3 months when he fell while looking after a large dog.  He feels as though the numbness in the range of his pain is larger.  He was  given a dose of steroids as well as Tylenol, Octagam, gabapentin, and using home exercises however has continued with pain.  Working at the computer and sleeping makes it worse.  He also has quite a bit of midline neck pain.  He denies any weakness in his upper extremities.  He has had no issues with fine motor skills or dropping things.  He also denies any changes to his gait or issues with walking."   PMH: HTN, major depressive disorder, migraines, LBP  PAIN:  Are you having pain? Yes: NPRS scale: 0/10 Pain location: left side neck radiating down L shoulder, with numbness and tingling in hands Pain description: aching, radiating Aggravating factors: lifting, turning head Relieving factors: gabapentin  PRECAUTIONS: None  RED FLAGS: None     WEIGHT BEARING RESTRICTIONS: No  FALLS:  Has patient fallen in last 6 months? Yes. Number of falls 1- tripped over large dog  LIVING ENVIRONMENT: Lives with: lives with their family Lives in: House/apartment Stairs: Yes: Internal: 14 steps; on left going up and External: 1 steps; none - stairs at work as well, teaches on second floor Has following equipment at home: None  OCCUPATION: Runner, broadcasting/film/video, SW middle  PLOF: Independent  Leisure: walking to work daily, coaches track and volleyball  PATIENT GOALS: "to see if there is anything can do about neck pain and numbness"  NEXT MD VISIT: has epidural scheduled for neck on 12/03/2023  OBJECTIVE:   DIAGNOSTIC FINDINGS:  MRI cervical spine 11/04/23:   IMPRESSION: 1. Multilevel cervical spondylosis as described above. Moderate spinal canal stenosis and moderate to severe bilateral neuroforaminal stenosis at C3-C4. 2. Mild spinal canal stenosis with moderate to severe left neuroforaminal stenosis at C4-C5. 3. Mild spinal canal stenosis with severe right and moderate left neuroforaminal stenosis at C5-C6. 4. Moderate right neuroforaminal  stenosis at T1-T2.  PATIENT SURVEYS:  NDI  21/50  COGNITION: Overall cognitive status: Within functional limits for tasks assessed  SENSATION: Light touch: Impaired  Diminished along R C6 dermatome, but reports numbness and tingling both arms, worse on L side especially in C5 dermatome.    POSTURE: No Significant postural limitations  PALPATION: Tenderness cervical paraspinals, L scalenes, L 1st rib.  Palpation of muscles on L reproduced radicular symptoms in L arm.    CERVICAL ROM:   Active ROM A/PROM (deg) eval AROM 12/18/23  Flexion 28 35  Extension 18 37  Right lateral flexion 25 35  Left lateral flexion 25 38  Right rotation 44 60  Left rotation 42 50 improved to 60 after TPDN   (Blank rows = not tested)  UPPER EXTREMITY ROM:  WNL, symmetric and pain free.    UPPER EXTREMITY MMT:  MMT Right eval Left eval  Shoulder flexion 5 5  Shoulder abduction 5 5  Shoulder internal rotation 5 5  Shoulder external rotation 5 5  Elbow flexion 5 5  Elbow extension 5 5  Wrist flexion 5 5  Wrist extension 5 5  Grip strength 75 45   (Blank rows = not tested)  CERVICAL SPECIAL TESTS:  Spurling's test: Positive and Distraction test: Positive  FUNCTIONAL TESTS:  NT  TODAY'S TREATMENT:                                                                                                                              DATE:  12/25/23 UBE L4; 3 min fwd and 3 min bwd UT stretch x30" Levator scap stretch x 30" Cervical lateral flexion iso 5x5" Cervical rotation iso 5x5" Cervical retraction + rotation x10 Cervical retraction + ext x5 Seated thoracic rotation x5 Seated thoracic ext into ball x5 Standing cervical retraction into ball + shoulder flexion 3# to fatigue Standing cervical retraction into ball + shoulder abd 3# 2x10 Standing rows 20lb 3x10 Lat pull down 15lb 2x10 working on keeping scapular depression to keep UT from compensation  12/23/23 UBE L2; 3 min fwd and 3 min bwd Doorway pec stretch 2x30" UT stretch x  30" Levator scap stretch x 30" Horizontal ABD GTB back to doorframe x 10 B ER GTB back to doorframe 2 x 10 Standing shoulder flexion 2lb x 10  Standing shoulder abduction 2lb x 10 CW/CCW circles on wall Standing rows 20lb 2x10  12/18/23 UBE L3; 3 min fwd and 3 min bwd Doorway pec stretch 60 deg and 90 deg of abd x 30" each Rechecked cervical ROM Trigger Point Dry Needling  Initial Treatment: Pt instructed on Dry Needling rational, procedures, and possible side effects. Pt instructed to expect mild to moderate muscle soreness later in the day and/or into the next day.  Pt instructed in methods to reduce muscle soreness. Pt instructed to continue prescribed HEP. Patient was educated on signs and symptoms of infection and other risk factors and advised to  seek medical attention should they occur.  Patient verbalized understanding of these instructions and education.   Patient Verbal Consent Given: Yes Education Handout Provided: Yes Muscles Treated: L UT and L post scalene Electrical Stimulation Performed: No Treatment Response/Outcome: Twitch response, decreased muscle tension/increased ROM  UT stretch x 30" Levator scap stretch x 30"  Cervical rotation with pillowcase x30" Cervical ext with pillowcase x30" Median nerve glide x10 Ulnar nerve glide x10 Radial nerve glide x10 Grip squeeze 2x10 Standing shoulder ER green TB 2x10 "W" green TB 2x10   12/16/23 UBE L1.0 3 min fwd and 3 min back Cervical extension w/ pillowcase 10x3" Cervical rotation w/ pillowcase 10x3" bil Supine chin tucks 2x10 Supine chin tucks + cervical rotation 10x3" bil Supine horizontal ABD RTB X 10  Supine ER RTB x 10  Thread the needle Seated lumbar flexion stretch green pball  Manual therapy: STM to bil UT, LS, scalenes  12/11/23 Seated levator scap stretch x30" UT stretch x30" Anterior scalene stretch x30" Doorway pec stretch 60 deg and 90 deg of abd x 30" each Cervical traction: 17# max pull,  15 sec rest, 60 sec hold x 10 min total Supine cervical retraction x10 Supine deep neck flexor activation x10 Supine cervical retraction + rotation x10 Self care: positions to avoid at rest/sitting and sleeping  11/27/23 EVAL Self Care: Findings, POC, self traction devices - inexpensive options available on [employer], imaging, red flag symptoms, advice on placement of TENS electrodes, heat v. cold.  Therapeutic Exercise: to improve strength and mobility.  Demo, verbal and tactile cues throughout for technique.  Seated Cervical Retraction  - 10 reps - Seated Shoulder Rolls   - 10 reps - First Rib Mobilization with Strap   - 10 reps  PATIENT EDUCATION:  Education details: see above Person educated: Patient Education method: Explanation, Demonstration, Verbal cues, and Handouts Education comprehension: verbalized understanding and returned demonstration  HOME EXERCISE PROGRAM: Access Code: [email or web address] URL: [email or web address] Date: 12/23/2023 Prepared by: Braylin Clark  Exercises - Seated Shoulder Rolls  - 3 x daily - 7 x weekly - 1 sets - 10 reps - First Rib Mobilization with Strap  - 1 x daily - 7 x weekly - 1 sets - 10 reps - Cervical Retraction with Resistance  - 1 x daily - 7 x weekly - 2 sets - 10 reps - Shoulder External Rotation and Scapular Retraction with Resistance  - 1 x daily - 7 x weekly - 2 sets - 10 reps - Shoulder W - External Rotation with Resistance  - 1 x daily - 7 x weekly - 2 sets - 10 reps - Supine Head Nod with Deep Neck Flexor Activation  - 1 x daily - 7 x weekly - 2 sets - 10 reps - Supine Deep Neck Flexor Training - Hold  - 1 x daily - 7 x weekly - 2 sets - 10 reps  ASSESSMENT:  CLINICAL IMPRESSION: Continued deep neck muscle strengthening today. Working on endurance with postural stability. Fatigues with maintaining cervical retraction and using UEs. Tends to also utilize some jaw lateral shift with cervical retraction.   From eval: Patient is  a 53 y.o. left hand dominant malewho was seen today for physical therapy evaluation and treatment for cervical radiculopathy.  His primary symptoms are on the L side in C5 dermatome, however he is losing grip strength in left hand, measured today 45lbs on left compared to 75lbs on right.  He also does get radicular symptoms on Right as  well and has diminished sensation on R C6 dermatome.  He has significantly limited cervical ROM and pain with all neck movements.  Today given initial HEP and advice on inexpensive cervical traction device, as he did get relief with distraction.  Cervical ESI scheduled for next week.  Johnathon Myrtle Tellado would benefit from skilled physical therapy to decrease pain, centralize symptoms, improve cervical AROM, and improve activity tolerance.   OBJECTIVE IMPAIRMENTS: decreased ROM, decreased strength, hypomobility, increased fascial restrictions, impaired perceived functional ability, increased muscle spasms, impaired tone, and pain.   ACTIVITY LIMITATIONS: carrying, lifting, bending, sleeping, bed mobility, and caring for others  PARTICIPATION LIMITATIONS: meal prep, cleaning, laundry, driving, shopping, community activity, occupation, and yard work  PERSONAL FACTORS: Time since onset of injury/illness/exacerbation and 1-2 comorbidities: HTN, major depressive disorder, migraines,  are also affecting patient's functional outcome.   REHAB POTENTIAL: Good  CLINICAL DECISION MAKING: Evolving/moderate complexity  EVALUATION COMPLEXITY: Moderate   GOALS: Goals reviewed with patient? Yes  SHORT TERM GOALS: Target date: 12/19/2023   Patient will be independent with initial HEP.  Baseline:  Goal status: INITIAL  LONG TERM GOALS: Target date: 01/22/2024   Patient will be independent with advanced/ongoing HEP to improve outcomes and carryover.  Baseline:  Goal status: INITIAL  2.  Patient will report 75% improvement in neck pain to improve QOL.  Baseline: 5-9/10 Goal  status: INITIAL  3.  Patient will demonstrate full pain free cervical ROM for safety with driving.  Baseline: see objective Goal status: INITIAL  4.  Patient will report at least 11 points improvement on NDI to demonstrate improved functional ability.  Baseline: 21/50 Goal status: INITIAL  5.  Patient will demonstrate 75% improvement in radicular symptoms.  Baseline: radiates down both arms Goal status: INITIAL  6. Patient will improve Left grip strength to 75lbs.    Baseline: 45 lbs Goal status: INITIAL   PLAN:  PT FREQUENCY: 2x/week  PT DURATION: 8 weeks  PLANNED INTERVENTIONS: 97110-Therapeutic exercises, 97530- Therapeutic activity, 97112- Neuromuscular re-education, 97535- Self Care, [id number]- Manual therapy, [id number]- Electrical stimulation (unattended), [phone]- Electrical stimulation (manual), [id number]- Ultrasound, [id number]- Traction (mechanical), Patient/Family education, Balance training, Stair training, Taping, Dry Needling, Joint mobilization, Joint manipulation, Spinal manipulation, Spinal mobilization, Cryotherapy, and Moist heat  PLAN FOR NEXT SESSION: review and progress HEP, manual therapy, try cervical traction   Hadasah Brugger April Ma L Hilarie Sinha, PT 12/25/2023, 3:26 PM

## 2023-12-26 ENCOUNTER — Other Ambulatory Visit: Payer: Self-pay

## 2023-12-26 ENCOUNTER — Ambulatory Visit (INDEPENDENT_AMBULATORY_CARE_PROVIDER_SITE_OTHER)

## 2023-12-26 ENCOUNTER — Encounter: Payer: Self-pay | Admitting: Podiatry

## 2023-12-26 ENCOUNTER — Ambulatory Visit: Admitting: Podiatry

## 2023-12-26 DIAGNOSIS — M7752 Other enthesopathy of left foot: Secondary | ICD-10-CM

## 2023-12-26 DIAGNOSIS — S9032XA Contusion of left foot, initial encounter: Secondary | ICD-10-CM | POA: Diagnosis not present

## 2023-12-26 NOTE — Progress Notes (Signed)
  Subjective:  Patient ID: George Ward, male    DOB: [birth year],   MRN: [id number]  Chief Complaint  Patient presents with   Foot Pain    Pt  presents for left foot pain that has been ongoing since April 7th. States he stepped on a dog toy.    53 y.o. male presents for concern of left foot pain that has been ongoing since April 7th. Relates he stepped on a dog toy and since then has had trouble with the left foot. Relates he rolled over the top of his foot and having pain mostly on the top near his toes. He has been icing elevating and taking anti-inflammatories. He relates he has been resting being on spring break but does get a lot of pain when walking on his feet for the day  . Denies any other pedal complaints. Denies n/v/f/c.   Past Medical History:  Diagnosis Date   Alcohol abuse    Remission >20 years ago   Allergy    Anxiety    Asthma    Cancer (HCC)    Depression    MAJOR DEPRESSIVE DISORDER   Depression with anxiety    Drug abuse (HCC)    Remission >20 years ago   Environmental allergies    History of chicken pox    Hyperlipidemia    Hypertension    Hypoalphalipoproteinemia, familial    Lumbar pain    Migraines    Primary generalized (osteo)arthritis    Seasonal allergies     Objective:  Physical Exam: Vascular: DP/PT pulses 2/4 bilateral. CFT <3 seconds. Normal hair growth on digits. No edema.  Skin. No lacerations or abrasions bilateral feet.  Musculoskeletal: MMT 5/5 bilateral lower extremities in DF, PF, Inversion and Eversion. Deceased ROM in DF of ankle joint.  Tender to the dorsum of the left foot. Mostly over the third and fourth metatarsal shafts. Some pain with dorsiflexion. Tendons appear to be intact.  Neurological: Sensation intact to light touch.   Assessment:   1. Contusion of left foot, initial encounter      Plan:  Patient was evaluated and treated and all questions answered. -Xrays reviewed. No acute fractures or dislocations noted   -Discussed treatement options for contusion of the foot ; risks, alternatives, and benefits explained. -Dispensed surgical shoe. Patient to wear at all times and instructed on use -Recommend protection, rest, ice, elevation daily until symptoms improve -Rx pain med/anti-inflammatories as needed -Patient to return to office as needed  Jennefer Moats, DPM

## 2023-12-30 ENCOUNTER — Ambulatory Visit

## 2023-12-30 DIAGNOSIS — R252 Cramp and spasm: Secondary | ICD-10-CM

## 2023-12-30 DIAGNOSIS — M6281 Muscle weakness (generalized): Secondary | ICD-10-CM

## 2023-12-30 DIAGNOSIS — M5412 Radiculopathy, cervical region: Secondary | ICD-10-CM

## 2023-12-30 NOTE — Therapy (Addendum)
 OUTPATIENT PHYSICAL THERAPY TREATMENT AND DISCHARGE  PHYSICAL THERAPY DISCHARGE SUMMARY  Visits from Start of Care: 7  Current functional level related to goals / functional outcomes: See below. Pain has been controlled.    Remaining deficits: See below. Still working on Veterinary surgeon / Equipment: See below   Patient agrees to discharge. Patient goals were partially met. Patient is being discharged due to not returning since the last visit.    Patient Name: George Ward MRN: [id number] DOB:06-26-1971, 53 y.o., male Today's Date: 12/30/2023  END OF SESSION:  PT End of Session - 12/30/23 1449     Visit Number 7    Date for PT Re-Evaluation 01/22/24    Authorization Type [employer] 2025 VL: [place]    PT Start Time 1446    PT Stop Time 1524    PT Time Calculation (min) 38 min    Activity Tolerance Patient tolerated treatment well    Behavior During Therapy WFL for tasks assessed/performed               Past Medical History:  Diagnosis Date   Alcohol abuse    Remission >20 years ago   Allergy    Anxiety    Asthma    Cancer (HCC)    Depression    MAJOR DEPRESSIVE DISORDER   Depression with anxiety    Drug abuse (HCC)    Remission >20 years ago   Environmental allergies    History of chicken pox    Hyperlipidemia    Hypertension    Hypoalphalipoproteinemia, familial    Lumbar pain    Migraines    Primary generalized (osteo)arthritis    Seasonal allergies    Past Surgical History:  Procedure Laterality Date   EXCISION MASS HEAD  03/17/2018   excision cells on head  ; still in testing for cancer per patient report    LUMBAR EPIDURAL INJECTION     SHOULDER ARTHROSCOPY Left 2021   UMBILICAL HERNIA REPAIR N/A 04/02/2018   Procedure: OPEN UMBILICAL HERNIA REPAIR WITH INSERTION OF MESH;  Surgeon: Teresa Lonni HERO, MD;  Location: [facility];  Service: General;  Laterality: N/A;   WISDOM TOOTH EXTRACTION  1989-1990   Patient  Active Problem List   Diagnosis Date Noted   Asthma 09/18/2022   Coronary artery calcification 12/13/2021   Dyspnea on exertion 12/13/2021   Dyslipidemia 12/13/2021   Essential hypertension 12/13/2021   Migraine 03/08/2021   Impulse control disorder in adult 03/06/2017   MDD (major depressive disorder) 03/06/2017    PCP: Dorina Dallas RIGGERS   REFERRING PROVIDER: Ulis Bottcher, PA-C  REFERRING DIAG: M50.10 (ICD-10-CM) - Cervical disc disorder with radiculopathy of cervical region   THERAPY DIAG:  Radiculopathy, cervical region  Cramp and spasm  Muscle weakness (generalized)  Rationale for Evaluation and Treatment: Rehabilitation  ONSET DATE: December 2024  SUBJECTIVE:  SUBJECTIVE STATEMENT: No complaint  From eval: Neck hurts most of the time, more on the left and back than right, moves certain way it all starts to go numb down my shoulder to elbow, sometimes does get tingling and numbness down right side to fingers but not as frequent or intense, difficult to turn to left.  I don't have a car, but its very difficult to drive wifes car, so haven't been driving.  Hard to lift, that makes it way worse.  Sleeping is an issue, I'm a side sleeping, I have to position pillow just right so pain and numbness don't get work.   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    Hand dominance: Ambidextrous write with Left hand  PERTINENT HISTORY:  From MD notes on 11/11/23 George Ward is here today with a chief complaint of neck pain.  He states that when he turns his head a certain way he has shooting pain as well as  numbness and tingling that radiates down to his left elbow.  He notices that his right upper extremity the neck pain radiates down to his first 2 digits.  This has been ongoing for 3 months when he fell while looking after a large dog.  He feels as though the numbness in the range of his pain is larger.  He was given a dose of steroids as well as Tylenol , Octagam, gabapentin , and using home exercises however has continued with pain.  Working at the computer and sleeping makes it worse.  He also has quite a bit of midline neck pain.  He denies any weakness in his upper extremities.  He has had no issues with fine motor skills or dropping things.  He also denies any changes to his gait or issues with walking.   PMH: HTN, major depressive disorder, migraines, LBP  PAIN:  Are you having pain? Yes: NPRS scale: 0/10 Pain location: left side neck radiating down L shoulder, with numbness and tingling in hands Pain description: aching, radiating Aggravating factors: lifting, turning head Relieving factors: gabapentin   PRECAUTIONS: None  RED FLAGS: None     WEIGHT BEARING RESTRICTIONS: No  FALLS:  Has patient fallen in last 6 months? Yes. Number of falls 1- tripped over large dog  LIVING ENVIRONMENT: Lives with: lives with their family Lives in: House/apartment Stairs: Yes: Internal: 14 steps; on left going up and External: 1 steps; none - stairs at work as well, teaches on second floor Has following equipment at home: None  OCCUPATION: Runner, broadcasting/film/video, SW middle  PLOF: Independent  Leisure: walking to work daily, coaches track and volleyball  PATIENT GOALS: to see if there is anything can do about neck pain and numbness  NEXT MD VISIT: 01/13/24  OBJECTIVE:   DIAGNOSTIC FINDINGS:  MRI cervical spine 11/04/23:   IMPRESSION: 1. Multilevel cervical spondylosis as described above. Moderate spinal canal stenosis and moderate to severe bilateral neuroforaminal stenosis at C3-C4. 2. Mild spinal  canal stenosis with moderate to severe left neuroforaminal stenosis at C4-C5. 3. Mild spinal canal stenosis with severe right and moderate left neuroforaminal stenosis at C5-C6. 4. Moderate right neuroforaminal stenosis at T1-T2.  PATIENT SURVEYS:  NDI 21/50  COGNITION: Overall cognitive status: Within functional limits for tasks assessed  SENSATION: Light touch: Impaired  Diminished along R  C6 dermatome, but reports numbness and tingling both arms, worse on L side especially in C5 dermatome.    POSTURE: No Significant postural limitations  PALPATION: Tenderness cervical paraspinals, L scalenes, L 1st rib.  Palpation of muscles on L reproduced radicular symptoms in L arm.    CERVICAL ROM:   Active ROM A/PROM (deg) eval AROM 12/18/23  Flexion 28 35  Extension 18 37  Right lateral flexion 25 35  Left lateral flexion 25 38  Right rotation 44 60  Left rotation 42 50 improved to 60 after TPDN   (Blank rows = not tested)  UPPER EXTREMITY ROM:  WNL, symmetric and pain free.    UPPER EXTREMITY MMT:  MMT Right eval Left eval  Shoulder flexion 5 5  Shoulder abduction 5 5  Shoulder internal rotation 5 5  Shoulder external rotation 5 5  Elbow flexion 5 5  Elbow extension 5 5  Wrist flexion 5 5  Wrist extension 5 5  Grip strength 75 45   (Blank rows = not tested)  CERVICAL SPECIAL TESTS:  Spurling's test: Positive and Distraction test: Positive  FUNCTIONAL TESTS:  NT  TODAY'S TREATMENT:                                                                                                                              DATE:  12/30/23 UBE L3; 3 min fwd and 3 min bwd Lat pull downs 15lb 2x10 Rows 25lb 2x10 low grips Standing cervical retraction into ball + shoulder flexion 3# 2x10 Standing cervical retraction into ball + shoulder abd 3# 2x10 Wall push up 2x10 Thoracic rotation GTB x 10 bil D1 extension GTB x 10 R/L Face pulls GTB x 10 Rhomboid stretch x 30 Ladder walk  ups 5x5 Grip strength: L UE 61lb 12/25/23 UBE L4; 3 min fwd and 3 min bwd UT stretch x30 Levator scap stretch x 30 Cervical lateral flexion iso 5x5 Cervical rotation iso 5x5 Cervical retraction + rotation x10 Cervical retraction + ext x5 Seated thoracic rotation x5 Seated thoracic ext into ball x5 Standing cervical retraction into ball + shoulder flexion 3# to fatigue Standing cervical retraction into ball + shoulder abd 3# 2x10 Standing rows 20lb 3x10 Lat pull down 15lb 2x10 working on keeping scapular depression to keep UT from compensation  12/23/23 UBE L2; 3 min fwd and 3 min bwd Doorway pec stretch 2x30 UT stretch x 30 Levator scap stretch x 30 Horizontal ABD GTB back to doorframe x 10 B ER GTB back to doorframe 2 x 10 Standing shoulder flexion 2lb x 10  Standing shoulder abduction 2lb x 10 CW/CCW circles on wall Standing rows 20lb 2x10  12/18/23 UBE L3; 3 min fwd and 3 min bwd Doorway pec stretch 60 deg and 90 deg of abd x 30 each Rechecked cervical ROM Trigger Point Dry Needling  Initial Treatment: Pt instructed on Dry Needling rational, procedures, and possible side effects. Pt instructed to expect mild  to moderate muscle soreness later in the day and/or into the next day.  Pt instructed in methods to reduce muscle soreness. Pt instructed to continue prescribed HEP. Patient was educated on signs and symptoms of infection and other risk factors and advised to seek medical attention should they occur.  Patient verbalized understanding of these instructions and education.   Patient Verbal Consent Given: Yes Education Handout Provided: Yes Muscles Treated: L UT and L post scalene Electrical Stimulation Performed: No Treatment Response/Outcome: Twitch response, decreased muscle tension/increased ROM  UT stretch x 30 Levator scap stretch x 30  Cervical rotation with pillowcase x30 Cervical ext with pillowcase x30 Median nerve glide x10 Ulnar nerve glide  x10 Radial nerve glide x10 Grip squeeze 2x10 Standing shoulder ER green TB 2x10 W green TB 2x10   12/16/23 UBE L1.0 3 min fwd and 3 min back Cervical extension w/ pillowcase 10x3 Cervical rotation w/ pillowcase 10x3 bil Supine chin tucks 2x10 Supine chin tucks + cervical rotation 10x3 bil Supine horizontal ABD RTB X 10  Supine ER RTB x 10  Thread the needle Seated lumbar flexion stretch green pball  Manual therapy: STM to bil UT, LS, scalenes  12/11/23 Seated levator scap stretch x30 UT stretch x30 Anterior scalene stretch x30 Doorway pec stretch 60 deg and 90 deg of abd x 30 each Cervical traction: 17# max pull, 15 sec rest, 60 sec hold x 10 min total Supine cervical retraction x10 Supine deep neck flexor activation x10 Supine cervical retraction + rotation x10 Self care: positions to avoid at rest/sitting and sleeping  11/27/23 EVAL Self Care: Findings, POC, self traction devices - inexpensive options available on [employer], imaging, red flag symptoms, advice on placement of TENS electrodes, heat v. cold.  Therapeutic Exercise: to improve strength and mobility.  Demo, verbal and tactile cues throughout for technique.  Seated Cervical Retraction  - 10 reps - Seated Shoulder Rolls   - 10 reps - First Rib Mobilization with Strap   - 10 reps  PATIENT EDUCATION:  Education details: see above Person educated: Patient Education method: Explanation, Demonstration, Verbal cues, and Handouts Education comprehension: verbalized understanding and returned demonstration  HOME EXERCISE PROGRAM: Access Code: [email or web address] URL: [email or web address] Date: 12/23/2023 Prepared by: Sherlyne Crownover  Exercises - Seated Shoulder Rolls  - 3 x daily - 7 x weekly - 1 sets - 10 reps - First Rib Mobilization with Strap  - 1 x daily - 7 x weekly - 1 sets - 10 reps - Cervical Retraction with Resistance  - 1 x daily - 7 x weekly - 2 sets - 10 reps - Shoulder External Rotation and  Scapular Retraction with Resistance  - 1 x daily - 7 x weekly - 2 sets - 10 reps - Shoulder W - External Rotation with Resistance  - 1 x daily - 7 x weekly - 2 sets - 10 reps - Supine Head Nod with Deep Neck Flexor Activation  - 1 x daily - 7 x weekly - 2 sets - 10 reps - Supine Deep Neck Flexor Training - Hold  - 1 x daily - 7 x weekly - 2 sets - 10 reps  ASSESSMENT:  CLINICAL IMPRESSION: Continued working on strengthening for postural muscles and flexibility. Pt continues to have no pain, his grip strength is improving towards his LTG. Cues provided as needed with interventions.   From eval: Patient is a 53 y.o. left hand dominant malewho was seen today for physical therapy evaluation and treatment for cervical  radiculopathy.  His primary symptoms are on the L side in C5 dermatome, however he is losing grip strength in left hand, measured today 45lbs on left compared to 75lbs on right.  He also does get radicular symptoms on Right as well and has diminished sensation on R C6 dermatome.  He has significantly limited cervical ROM and pain with all neck movements.  Today given initial HEP and advice on inexpensive cervical traction device, as he did get relief with distraction.  Cervical ESI scheduled for next week.  George Ward would benefit from skilled physical therapy to decrease pain, centralize symptoms, improve cervical AROM, and improve activity tolerance.   OBJECTIVE IMPAIRMENTS: decreased ROM, decreased strength, hypomobility, increased fascial restrictions, impaired perceived functional ability, increased muscle spasms, impaired tone, and pain.   ACTIVITY LIMITATIONS: carrying, lifting, bending, sleeping, bed mobility, and caring for others  PARTICIPATION LIMITATIONS: meal prep, cleaning, laundry, driving, shopping, community activity, occupation, and yard work  PERSONAL FACTORS: Time since onset of injury/illness/exacerbation and 1-2 comorbidities: HTN, major depressive disorder,  migraines, are also affecting patient's functional outcome.   REHAB POTENTIAL: Good  CLINICAL DECISION MAKING: Evolving/moderate complexity  EVALUATION COMPLEXITY: Moderate   GOALS: Goals reviewed with patient? Yes  SHORT TERM GOALS: Target date: 12/19/2023   Patient will be independent with initial HEP.  Baseline:  Goal status: INITIAL  LONG TERM GOALS: Target date: 01/22/2024   Patient will be independent with advanced/ongoing HEP to improve outcomes and carryover.  Baseline:  Goal status: INITIAL  2.  Patient will report 75% improvement in neck pain to improve QOL.  Baseline: 5-9/10 Goal status: INITIAL  3.  Patient will demonstrate full pain free cervical ROM for safety with driving.  Baseline: see objective Goal status: INITIAL  4.  Patient will report at least 11 points improvement on NDI to demonstrate improved functional ability.  Baseline: 21/50 Goal status: INITIAL  5.  Patient will demonstrate 75% improvement in radicular symptoms.  Baseline: radiates down both arms Goal status: INITIAL  6. Patient will improve Left grip strength to 75lbs.    Baseline: 45 lbs Goal status: INITIAL   PLAN:  PT FREQUENCY: 2x/week  PT DURATION: 8 weeks  PLANNED INTERVENTIONS: 97110-Therapeutic exercises, 97530- Therapeutic activity, [id number]- Neuromuscular re-education, 97535- Self Care, [id number]- Manual therapy, [id number]- Electrical stimulation (unattended), [id number]- Electrical stimulation (manual), [id number]- Ultrasound, [id number]- Traction (mechanical), Patient/Family education, Balance training, Stair training, Taping, Dry Needling, Joint mobilization, Joint manipulation, Spinal manipulation, Spinal mobilization, Cryotherapy, and Moist heat  PLAN FOR NEXT SESSION: review and progress HEP, manual therapy, try cervical traction   Sol LITTIE Gaskins, PTA 12/30/2023, 3:28 PM

## 2023-12-31 ENCOUNTER — Ambulatory Visit: Admitting: Student in an Organized Health Care Education/Training Program

## 2024-01-01 ENCOUNTER — Ambulatory Visit: Admitting: Physician Assistant

## 2024-01-02 ENCOUNTER — Ambulatory Visit

## 2024-01-13 ENCOUNTER — Ambulatory Visit: Admitting: Physician Assistant

## 2024-01-13 ENCOUNTER — Encounter: Payer: Self-pay | Admitting: Physician Assistant

## 2024-01-13 VITALS — BP 108/76 | Ht 70.0 in | Wt 196.0 lb

## 2024-01-13 DIAGNOSIS — M50122 Cervical disc disorder at C5-C6 level with radiculopathy: Secondary | ICD-10-CM | POA: Diagnosis not present

## 2024-01-13 DIAGNOSIS — M5010 Cervical disc disorder with radiculopathy, unspecified cervical region: Secondary | ICD-10-CM

## 2024-01-13 NOTE — Progress Notes (Unsigned)
 Referring Physician:  Sylvia Everts, PA-C [address] [place],  [place] [address]  Primary Physician:  Sylvia Everts, PA-C  History of Present Illness: 01/13/24 George Ward is here today for follow-up on his neck pain.  When he first came into clinic he was in numbness and tingling that radiated down to his left elbow as well as in his right upper extremity down to his first 2 digits.  Since he was last seen he has been undergoing physical therapy is also received injections with our pain team.  He has had a great result and is very happy.  He states he has very little to no neck pain remaining.  He has increasing strength in his left hand and he feels as though his range of motion has improved.  He reports no numbness and tingling.  No other concerns at this time.   Conservative measures:  Physical therapy:  has not participated in PT Multimodal medical therapy including regular antiinflammatories:  Gabapentin , Ibuprofen, Meloxicam , Medrol  Injections: no epidural steroid injections  Past Surgery: none  George Ward has no symptoms of cervical myelopathy.  The symptoms are causing a significant impact on the patient's life.   Review of Systems:  A 10 point review of systems is negative, except for the pertinent positives and negatives detailed in the HPI.  Past Medical History: Past Medical History:  Diagnosis Date   Alcohol abuse    Remission >20 years ago   Allergy    Anxiety    Asthma    Cancer (HCC)    Depression    MAJOR DEPRESSIVE DISORDER   Depression with anxiety    Drug abuse (HCC)    Remission >20 years ago   Environmental allergies    History of chicken pox    Hyperlipidemia    Hypertension    Hypoalphalipoproteinemia, familial    Lumbar pain    Migraines    Primary generalized (osteo)arthritis    Seasonal allergies     Past Surgical History: Past Surgical History:  Procedure Laterality Date   EXCISION MASS HEAD  03/17/2018    excision cells on head  ; still in testing for cancer per patient report    LUMBAR EPIDURAL INJECTION     SHOULDER ARTHROSCOPY Left 2021   UMBILICAL HERNIA REPAIR N/A 04/02/2018   Procedure: OPEN UMBILICAL HERNIA REPAIR WITH INSERTION OF MESH;  Surgeon: Melvenia Stabs, MD;  Location: [facility];  Service: General;  Laterality: N/A;   WISDOM TOOTH EXTRACTION  1989-1990    Allergies: Allergies as of 01/13/2024 - Review Complete 01/13/2024  Allergen Reaction Noted   Co-trimoxazole [sulfamethoxazole-trimethoprim] Nausea And Vomiting 11/09/2016   Lisinopril Cough 11/07/2016   Sulfa antibiotics Nausea And Vomiting 04/02/2018    Medications: Outpatient Encounter Medications as of 01/13/2024  Medication Sig   albuterol (PROVENTIL HFA;VENTOLIN HFA) 108 (90 Base) MCG/ACT inhaler Inhale 1-2 puffs into the lungs every 6 (six) hours as needed for wheezing or shortness of breath. Ventolin   amLODipine  (NORVASC ) 5 MG tablet TAKE 1 TABLET (5 MG TOTAL) BY MOUTH DAILY.   buPROPion (WELLBUTRIN XL) 150 MG 24 hr tablet Take 150 mg by mouth every morning.   calcium  carbonate (TUMS - DOSED IN MG ELEMENTAL CALCIUM ) 500 MG chewable tablet Chew 2 tablets by mouth daily as needed for indigestion or heartburn.   fenofibrate  micronized (LOFIBRA) 67 MG capsule TAKE 1 CAPSULE (67 MG TOTAL) BY MOUTH DAILY BEFORE BREAKFAST. (Patient taking differently: Take 62  mg by mouth daily before breakfast.)   fluticasone  (FLONASE ) 50 MCG/ACT nasal spray SPRAY 2 SPRAYS INTO EACH NOSTRIL EVERY DAY   fluticasone -salmeterol (WIXELA INHUB) 500-50 MCG/ACT AEPB INHALE 2 PUFFS INTO THE LUNGS IN THE MORNING AND AT BEDTIME.   gabapentin  (NEURONTIN ) 300 MG capsule Take 1 capsule (300 mg total) by mouth 3 (three) times daily.   ipratropium (ATROVENT ) 0.03 % nasal spray PLACE 2 SPRAYS INTO BOTH NOSTRILS EVERY 12 (TWELVE) HOURS.   loratadine (CLARITIN) 10 MG tablet Take 10 mg by mouth daily as needed for allergies.   LORazepam  (ATIVAN ) 0.5  MG tablet Take 1 tablet (0.5 mg total) by mouth 2 (two) times daily as needed for anxiety.   Multiple Vitamin (MULTIVITAMIN WITH MINERALS) TABS tablet Take 1 tablet by mouth daily.   propranolol  (INDERAL ) 20 MG tablet TAKE 1 TABLET BY MOUTH TWICE A DAY (Patient taking differently: Take 20 mg by mouth 2 (two) times daily.)   SUMAtriptan  (IMITREX ) 50 MG tablet 1 TABLET DAILY , MAY REPEAT IN 2 HOURS IF HEADACHE PERSISTS OR RECURS. (Patient taking differently: Take 50 mg by mouth See admin instructions. 1 tablet daily , May repeat in 2 hours if headache persists or recurs.)   triamterene -hydrochlorothiazide (MAXZIDE-25) 37.5-25 MG tablet TAKE 1 TABLET BY MOUTH EVERY DAY   atorvastatin  (LIPITOR) 80 MG tablet Take 1 tablet (80 mg total) by mouth daily.   FLUoxetine  (PROZAC ) 40 MG capsule Take 2 capsules (80 mg total) by mouth daily.   QUEtiapine  (SEROQUEL ) 50 MG tablet Take 2 tablets (100 mg total) by mouth at bedtime.   zolpidem  (AMBIEN ) 10 MG tablet Take 1 tablet (10 mg total) by mouth at bedtime as needed for sleep.   No facility-administered encounter medications on file as of 01/13/2024.    Social History: Social History   Tobacco Use   Smoking status: Former    Current packs/day: 0.00    Types: Cigarettes    Quit date: 03/07/1988    Years since quitting: 35.8   Smokeless tobacco: Current    Types: Chew  Vaping Use   Vaping status: Never Used  Substance Use Topics   Alcohol use: Not Currently    Comment: 1/month   Drug use: Not Currently    Family Medical History: Family History  Problem Relation Age of Onset   Hypertension Mother    Hyperlipidemia Father    Healthy Sister    Hypertension Maternal Uncle    Hyperlipidemia Maternal Uncle    Hepatitis Paternal Aunt    Hepatitis C Paternal Aunt    Hypertension Paternal Uncle    Hyperlipidemia Paternal Uncle    Colon cancer Maternal Grandfather    Prostate cancer Maternal Grandfather    Congestive Heart Failure Maternal  Grandfather    Dementia Paternal Grandmother    Heart disease Paternal Grandfather    Heart attack Paternal Grandfather    Healthy Daughter        x4   Healthy Son        x1   Esophageal cancer Neg Hx    Stomach cancer Neg Hx    Rectal cancer Neg Hx     Physical Examination: @VITALWITHPAIN @  General: Patient is well developed, well nourished, calm, collected, and in no apparent distress. Attention to examination is appropriate.  Psychiatric: Patient is non-anxious.  Head:  Pupils equal, round, and reactive to light.  ENT:  Oral mucosa appears well hydrated.  Neck:   Supple.  Decreased range of motion.  Respiratory: Patient is  breathing without any difficulty.  Extremities: No edema.  Vascular: Palpable dorsal pedal pulses.  Skin:   On exposed skin, there are no abnormal skin lesions.  NEUROLOGICAL:     Awake, alert, oriented to person, place, and time.  Speech is clear and fluent. Fund of knowledge is appropriate.   Cranial Nerves: Pupils equal round and reactive to light.  Facial tone is symmetric.     Patient was supplemented range of motion to cervical spine.    Strength: Side Biceps Triceps Deltoid Interossei Grip Wrist Ext. Wrist Flex.  R 5 5 5 5 5 5 5   L 5 5 5 5 5 5 5     Reflexes are 2+ and symmetric at the biceps, triceps, brachioradialis.   Hoffman's is absent.  Bilateral upper extremity sensation is intact to light touch.    Gait is normal.   No difficulty with tandem gait.   No evidence of dysmetria noted.  Medical Decision Making  Imaging: MRI cervical spine 11/04/23:  IMPRESSION: 1. Multilevel cervical spondylosis as described above. Moderate spinal canal stenosis and moderate to severe bilateral neuroforaminal stenosis at C3-C4. 2. Mild spinal canal stenosis with moderate to severe left neuroforaminal stenosis at C4-C5. 3. Mild spinal canal stenosis with severe right and moderate left neuroforaminal stenosis at C5-C6. 4. Moderate right  neuroforaminal stenosis at T1-T2.  Complete cervical spine (12/03/23): IMPRESSION: 1. Similar appearance of multilevel cervical disc degeneration, most advanced at C5-6. 2. Grade 1 anterolisthesis of C4 on C5 which mildly increases with flexion and reduces with extension.  I have personally reviewed the images and agree with the above interpretation.  Assessment and Plan: George Ward is a pleasant 53 y.o. male is here today for follow-up on his neck pain.  When he first came into clinic he was in numbness and tingling that radiated down to his left elbow as well as in his right upper extremity down to his first 2 digits.  Since he was last seen he has been undergoing physical therapy is also received injections with our pain team.  He has had a great result and is very happy.  He states he has very little to no neck pain remaining.  He has increasing strength in his left hand and he feels as though his range of motion has improved.  He reports no numbness and tingling.  No other concerns at this time.  Pleasure to see patient in clinic today.  I am pleased that he is doing so well.  Encouraged patient to continue home exercises for his neck and decrease risk of reinjury with lifting restrictions.Red flag symptoms were reviewed with patient at length.  He was encouraged to reach out to us  for any questions or concerns.  No scheduled follow-up at this time.  Thank you for involving me in the care of this patient.   Ludwig Safer, PA-C Dept. of Neurosurgery

## 2024-02-05 ENCOUNTER — Other Ambulatory Visit: Payer: Self-pay | Admitting: Medical

## 2024-02-23 ENCOUNTER — Other Ambulatory Visit: Payer: Self-pay | Admitting: Medical

## 2024-02-28 ENCOUNTER — Other Ambulatory Visit: Payer: Self-pay | Admitting: Sports Medicine

## 2024-02-28 ENCOUNTER — Other Ambulatory Visit: Payer: Self-pay | Admitting: Medical

## 2024-02-28 ENCOUNTER — Ambulatory Visit: Admitting: Medical

## 2024-03-03 ENCOUNTER — Other Ambulatory Visit: Payer: Self-pay | Admitting: Medical

## 2024-05-20 ENCOUNTER — Other Ambulatory Visit: Payer: Self-pay | Admitting: Medical

## 2024-05-21 ENCOUNTER — Other Ambulatory Visit: Payer: Self-pay | Admitting: Medical

## 2024-05-25 ENCOUNTER — Ambulatory Visit (INDEPENDENT_AMBULATORY_CARE_PROVIDER_SITE_OTHER): Admitting: Medical

## 2024-05-25 VITALS — BP 118/80 | HR 68 | Temp 97.8°F | Resp 16 | Ht 70.0 in | Wt 198.4 lb

## 2024-05-25 DIAGNOSIS — Z23 Encounter for immunization: Secondary | ICD-10-CM | POA: Diagnosis not present

## 2024-05-25 DIAGNOSIS — Z125 Encounter for screening for malignant neoplasm of prostate: Secondary | ICD-10-CM

## 2024-05-25 DIAGNOSIS — D649 Anemia, unspecified: Secondary | ICD-10-CM | POA: Diagnosis not present

## 2024-05-25 DIAGNOSIS — Z0000 Encounter for general adult medical examination without abnormal findings: Secondary | ICD-10-CM | POA: Diagnosis not present

## 2024-05-25 DIAGNOSIS — Z1159 Encounter for screening for other viral diseases: Secondary | ICD-10-CM

## 2024-05-25 DIAGNOSIS — Z0184 Encounter for antibody response examination: Secondary | ICD-10-CM

## 2024-05-25 NOTE — Progress Notes (Signed)
 Subjective:    Patient ID: George Ward, male    DOB: [birth year], 53 y.o.   MRN: [id number]  HPI  Pt here for wellness exam.   Works as Runner, broadcasting/film/video. Pt has been working out/walking daily 5-7 days a week. States eating healthy overall. One cup coffee a day.    Hx of smoking in past.(Reviewed pt pulmonologist note from 07-2023)   Pt has not had colonoscopy though did put in order for that in 2022. Pt states go busy and did not get that done.   Up to date on shingrix  vaccine.  Pt update me he is seeing derm and got tx for scalp lesions.  Review of Systems  Constitutional:  Negative for chills, fatigue and fever.  HENT:  Negative for congestion and drooling.   Respiratory:  Negative for cough, chest tightness, shortness of breath and wheezing.   Cardiovascular:  Negative for chest pain and palpitations.  Gastrointestinal:  Negative for abdominal pain, constipation, diarrhea and nausea.  Musculoskeletal:  Negative for back pain, myalgias and neck stiffness.  Skin:  Negative for rash.  Neurological:  Negative for dizziness, syncope, weakness and light-headedness.  Hematological:  Negative for adenopathy. Does not bruise/bleed easily.  Psychiatric/Behavioral:  Negative for behavioral problems and decreased concentration.     Past Medical History:  Diagnosis Date   Alcohol abuse    Remission >20 years ago   Allergy    Anxiety    Asthma    Cancer (HCC)    Depression    MAJOR DEPRESSIVE DISORDER   Depression with anxiety    Drug abuse (HCC)    Remission >20 years ago   Environmental allergies    History of chicken pox    Hyperlipidemia    Hypertension    Hypoalphalipoproteinemia, familial    Lumbar pain    Migraines    Primary generalized (osteo)arthritis    Seasonal allergies      Social History   Socioeconomic History   Marital status: Married    Spouse name: Not on file   Number of children: Not on file   Years of education: Not on file   Highest education  level: Master's degree (e.g., MA, MS, MEng, MEd, MSW, MBA)  Occupational History   Not on file  Tobacco Use   Smoking status: Former    Current packs/day: 0.00    Types: Cigarettes    Quit date: 03/07/1988    Years since quitting: 36.2   Smokeless tobacco: Current    Types: Chew  Vaping Use   Vaping status: Never Used  Substance and Sexual Activity   Alcohol use: Not Currently    Comment: 1/month   Drug use: Not Currently   Sexual activity: Yes  Other Topics Concern   Not on file  Social History Narrative   Not on file   Social Drivers of Health   Financial Resource Strain: Low Risk  (09/08/2023)   Overall Financial Resource Strain (CARDIA)    Difficulty of Paying Living Expenses: Not hard at all  Food Insecurity: No Food Insecurity (09/08/2023)   Hunger Vital Sign    Worried About Running Out of Food in the Last Year: Never true    Ran Out of Food in the Last Year: Never true  Transportation Needs: No Transportation Needs (09/08/2023)   PRAPARE - Administrator, Civil Service (Medical): No    Lack of Transportation (Non-Medical): No  Physical Activity: Sufficiently Active (09/08/2023)   Exercise Vital Sign  Days of Exercise per Week: 5 days    Minutes of Exercise per Session: 120 min  Stress: Stress Concern Present (09/08/2023)   [employer] of Occupational Health - Occupational Stress Questionnaire    Feeling of Stress : To some extent  Social Connections: Moderately Isolated (09/08/2023)   Social Connection and Isolation Panel    Frequency of Communication with Friends and Family: Three times a week    Frequency of Social Gatherings with Friends and Family: Once a week    Attends Religious Services: Never    Database administrator or Organizations: No    Attends Engineer, structural: Not on file    Marital Status: Married  Catering manager Violence: Not on file    Past Surgical History:  Procedure Laterality Date   EXCISION MASS  HEAD  03/17/2018   excision cells on head  ; still in testing for cancer per patient report    LUMBAR EPIDURAL INJECTION     SHOULDER ARTHROSCOPY Left 2021   UMBILICAL HERNIA REPAIR N/A 04/02/2018   Procedure: OPEN UMBILICAL HERNIA REPAIR WITH INSERTION OF MESH;  Surgeon: Teresa Lonni HERO, MD;  Location: [facility];  Service: General;  Laterality: N/A;   WISDOM TOOTH EXTRACTION  1989-1990    Family History  Problem Relation Age of Onset   Hypertension Mother    Hyperlipidemia Father    Healthy Sister    Hypertension Maternal Uncle    Hyperlipidemia Maternal Uncle    Hepatitis Paternal Aunt    Hepatitis C Paternal Aunt    Hypertension Paternal Uncle    Hyperlipidemia Paternal Uncle    Colon cancer Maternal Grandfather    Prostate cancer Maternal Grandfather    Congestive Heart Failure Maternal Grandfather    Dementia Paternal Grandmother    Heart disease Paternal Grandfather    Heart attack Paternal Grandfather    Healthy Daughter        x4   Healthy Son        x1   Esophageal cancer Neg Hx    Stomach cancer Neg Hx    Rectal cancer Neg Hx     Allergies  Allergen Reactions   Co-Trimoxazole [Sulfamethoxazole-Trimethoprim] Nausea And Vomiting   Lisinopril Cough    With Wheezing    Sulfa Antibiotics Nausea And Vomiting    Current Outpatient Medications on File Prior to Visit  Medication Sig Dispense Refill   albuterol (PROVENTIL HFA;VENTOLIN HFA) 108 (90 Base) MCG/ACT inhaler Inhale 1-2 puffs into the lungs every 6 (six) hours as needed for wheezing or shortness of breath. Ventolin     amLODipine  (NORVASC ) 5 MG tablet TAKE 1 TABLET (5 MG TOTAL) BY MOUTH DAILY. 90 tablet 0   atorvastatin  (LIPITOR) 80 MG tablet Take 1 tablet (80 mg total) by mouth daily. 90 tablet 3   buPROPion (WELLBUTRIN XL) 150 MG 24 hr tablet Take 150 mg by mouth every morning.     calcium  carbonate (TUMS - DOSED IN MG ELEMENTAL CALCIUM ) 500 MG chewable tablet Chew 2 tablets by mouth daily as needed for  indigestion or heartburn.     fenofibrate  micronized (LOFIBRA) 67 MG capsule TAKE 1 CAPSULE (67 MG TOTAL) BY MOUTH DAILY BEFORE BREAKFAST. 90 capsule 1   FLUoxetine  (PROZAC ) 40 MG capsule Take 2 capsules (80 mg total) by mouth daily. 180 capsule 1   fluticasone  (FLONASE ) 50 MCG/ACT nasal spray SPRAY 2 SPRAYS INTO EACH NOSTRIL EVERY DAY 48 mL 1   fluticasone -salmeterol (WIXELA INHUB) 500-50 MCG/ACT AEPB Inhale  2 puffs into the lungs in the morning and at bedtime. 60 each 1   gabapentin  (NEURONTIN ) 300 MG capsule TAKE 1 CAPSULE BY MOUTH THREE TIMES A DAY 120 capsule 2   ipratropium (ATROVENT ) 0.03 % nasal spray PLACE 2 SPRAYS INTO BOTH NOSTRILS EVERY 12 (TWELVE) HOURS. 90 mL 1   loratadine (CLARITIN) 10 MG tablet Take 10 mg by mouth daily as needed for allergies.     LORazepam  (ATIVAN ) 0.5 MG tablet Take 1 tablet (0.5 mg total) by mouth 2 (two) times daily as needed for anxiety. 60 tablet 1   Multiple Vitamin (MULTIVITAMIN WITH MINERALS) TABS tablet Take 1 tablet by mouth daily.     propranolol  (INDERAL ) 20 MG tablet TAKE 1 TABLET BY MOUTH TWICE A DAY 180 tablet 1   QUEtiapine  (SEROQUEL ) 50 MG tablet Take 2 tablets (100 mg total) by mouth at bedtime. 180 tablet 1   SUMAtriptan  (IMITREX ) 50 MG tablet TAKE 1 TABLET DAILY , MAY REPEAT IN 2 HOURS IF HEADACHE PERSISTS OR RECURS. 27 tablet 0   triamterene -hydrochlorothiazide (MAXZIDE-25) 37.5-25 MG tablet TAKE 1 TABLET BY MOUTH EVERY DAY 90 tablet 1   zolpidem  (AMBIEN ) 10 MG tablet Take 1 tablet (10 mg total) by mouth at bedtime as needed for sleep. 30 tablet 1   No current facility-administered medications on file prior to visit.    BP 118/80   Pulse 68   Temp 97.8 F (36.6 C) (Oral)   Resp 16   Ht 5' 10 (1.778 m)   Wt 198 lb 6.4 oz (90 kg)   SpO2 98%   BMI 28.47 kg/m        Objective:   Physical Exam  General Mental Status- Alert. General Appearance- Not in acute distress.   Skin General: Color- Normal Color. Moisture- Normal  Moisture.  Neck Carotid Arteries- Normal color. Moisture- Normal Moisture. No carotid bruits. No JVD.  Chest and Lung Exam Auscultation: Breath Sounds:-CTA  Cardiovascular Auscultation:Rythm- RRR Murmurs & Other Heart Sounds:Auscultation of the heart reveals- No Murmurs.  Abdomen Inspection:-Inspeection Normal. Palpation/Percussion:Note:No mass. Palpation and Percussion of the abdomen reveal- Non Tender, Non Distended + BS, no rebound or guarding.   Neurologic Cranial Nerve exam:- CN III-XII intact(No nystagmus), symmetric smile. Strength:- 5/5 equal and symmetric strength both upper and lower extremities.       Assessment & Plan:   Patient Instructions  For you wellness exam today I have ordered cbc, cmp, psa hep b surface antibody, hep c antibody and lipid panel.  Vaccine up to date.  Recommend exercise and healthy diet.  We will let you know lab results as they come in.  Follow up date appointment will be determined after lab review.     Joshiah Traynham, PA-C

## 2024-05-25 NOTE — Patient Instructions (Addendum)
 For you wellness exam today I have ordered cbc, cmp, psa hep b surface antibody, hep c antibody and lipid panel.  Vaccine up to date.  Recommend exercise and healthy diet.  We will let you know lab results as they come in.  Follow up date appointment will be determined after lab review.     Preventive Care 20-54 Years Old, Male Preventive care refers to lifestyle choices and visits with your health care provider that can promote health and wellness. Preventive care visits are also called wellness exams. What can I expect for my preventive care visit? Counseling During your preventive care visit, your health care provider may ask about your: Medical history, including: Past medical problems. Family medical history. Current health, including: Emotional well-being. Home life and relationship well-being. Sexual activity. Lifestyle, including: Alcohol, nicotine or tobacco, and drug use. Access to firearms. Diet, exercise, and sleep habits. Safety issues such as seatbelt and bike helmet use. Sunscreen use. Work and work Astronomer. Physical exam Your health care provider will check your: Height and weight. These may be used to calculate your BMI (body mass index). BMI is a measurement that tells if you are at a healthy weight. Waist circumference. This measures the distance around your waistline. This measurement also tells if you are at a healthy weight and may help predict your risk of certain diseases, such as type 2 diabetes and high blood pressure. Heart rate and blood pressure. Body temperature. Skin for abnormal spots. What immunizations do I need?  Vaccines are usually given at various ages, according to a schedule. Your health care provider will recommend vaccines for you based on your age, medical history, and lifestyle or other factors, such as travel or where you work. What tests do I need? Screening Your health care provider may recommend screening tests for certain  conditions. This may include: Lipid and cholesterol levels. Diabetes screening. This is done by checking your blood sugar (glucose) after you have not eaten for a while (fasting). Hepatitis B test. Hepatitis C test. HIV (human immunodeficiency virus) test. STI (sexually transmitted infection) testing, if you are at risk. Lung cancer screening. Prostate cancer screening. Colorectal cancer screening. Talk with your health care provider about your test results, treatment options, and if necessary, the need for more tests. Follow these instructions at home: Eating and drinking  Eat a diet that includes fresh fruits and vegetables, whole grains, lean protein, and low-fat dairy products. Take vitamin and mineral supplements as recommended by your health care provider. Do not drink alcohol if your health care provider tells you not to drink. If you drink alcohol: Limit how much you have to 0-2 drinks a day. Know how much alcohol is in your drink. In the [place] one drink equals one 12 oz bottle of beer (355 mL), one 5 oz glass of wine (148 mL), or one 1 oz glass of hard liquor (44 mL). Lifestyle Brush your teeth every morning and night with fluoride toothpaste. Floss one time each day. Exercise for at least 30 minutes 5 or more days each week. Do not use any products that contain nicotine or tobacco. These products include cigarettes, chewing tobacco, and vaping devices, such as e-cigarettes. If you need help quitting, ask your health care provider. Do not use drugs. If you are sexually active, practice safe sex. Use a condom or other form of protection to prevent STIs. Take aspirin only as told by your health care provider. Make sure that you understand how much to take  and what form to take. Work with your health care provider to find out whether it is safe and beneficial for you to take aspirin daily. Find healthy ways to manage stress, such as: Meditation, yoga, or listening to  music. Journaling. Talking to a trusted person. Spending time with friends and family. Minimize exposure to UV radiation to reduce your risk of skin cancer. Safety Always wear your seat belt while driving or riding in a vehicle. Do not drive: If you have been drinking alcohol. Do not ride with someone who has been drinking. When you are tired or distracted. While texting. If you have been using any mind-altering substances or drugs. Wear a helmet and other protective equipment during sports activities. If you have firearms in your house, make sure you follow all gun safety procedures. What's next? Go to your health care provider once a year for an annual wellness visit. Ask your health care provider how often you should have your eyes and teeth checked. Stay up to date on all vaccines. This information is not intended to replace advice given to you by your health care provider. Make sure you discuss any questions you have with your health care provider. Document Revised: 02/22/2021 Document Reviewed: 02/22/2021 Elsevier Patient Education  2024 [employer].

## 2024-05-26 ENCOUNTER — Ambulatory Visit: Payer: Self-pay | Admitting: Medical

## 2024-05-26 LAB — LIPID PANEL
Cholesterol: 148 mg/dL (ref 0–200)
HDL: 44.4 mg/dL (ref >39.00)
LDL Cholesterol: 87 mg/dL (ref 0–99)
NonHDL: 103.66
Total CHOL/HDL Ratio: 3
Triglycerides: 82 mg/dL (ref 0.0–149.0)
VLDL: 16.4 mg/dL (ref 0.0–40.0)

## 2024-05-26 LAB — CBC WITH DIFFERENTIAL/PLATELET
Basophils Absolute: 0 K/uL (ref 0.0–0.1)
Basophils Relative: 0.5 % (ref 0.0–3.0)
Eosinophils Absolute: 0.2 K/uL (ref 0.0–0.7)
Eosinophils Relative: 3.5 % (ref 0.0–5.0)
HCT: 37 % — ABNORMAL LOW (ref 39.0–52.0)
Hemoglobin: 11.7 g/dL — ABNORMAL LOW (ref 13.0–17.0)
Lymphocytes Relative: 25.7 % (ref 12.0–46.0)
Lymphs Abs: 1.6 K/uL (ref 0.7–4.0)
MCHC: 31.8 g/dL (ref 30.0–36.0)
MCV: 75.2 fl — ABNORMAL LOW (ref 78.0–100.0)
Monocytes Absolute: 0.4 K/uL (ref 0.1–1.0)
Monocytes Relative: 7.3 % (ref 3.0–12.0)
Neutro Abs: 3.8 K/uL (ref 1.4–7.7)
Neutrophils Relative %: 63 % (ref 43.0–77.0)
Platelets: 292 K/uL (ref 150.0–400.0)
RBC: 4.92 Mil/uL (ref 4.22–5.81)
RDW: 17 % — ABNORMAL HIGH (ref 11.5–15.5)
WBC: 6.1 K/uL (ref 4.0–10.5)

## 2024-05-26 LAB — COMPREHENSIVE METABOLIC PANEL WITH GFR
ALT: 24 U/L (ref 0–53)
AST: 27 U/L (ref 0–37)
Albumin: 4.6 g/dL (ref 3.5–5.2)
Alkaline Phosphatase: 69 U/L (ref 39–117)
BUN: 19 mg/dL (ref 6–23)
CO2: 29 meq/L (ref 19–32)
Calcium: 9.7 mg/dL (ref 8.4–10.5)
Chloride: 101 meq/L (ref 96–112)
Creatinine, Ser: 1.06 mg/dL (ref 0.40–1.50)
GFR: 79.91 mL/min (ref >60.00)
Glucose, Bld: 85 mg/dL (ref 70–99)
Potassium: 4 meq/L (ref 3.5–5.1)
Sodium: 140 meq/L (ref 135–145)
Total Bilirubin: 0.7 mg/dL (ref 0.2–1.2)
Total Protein: 6.8 g/dL (ref 6.0–8.3)

## 2024-05-26 LAB — HEPATITIS C ANTIBODY: Hepatitis C Ab: NONREACTIVE

## 2024-05-26 LAB — HEPATITIS B SURFACE ANTIBODY, QUANTITATIVE: Hep B S AB Quant (Post): <5 m[IU]/mL — ABNORMAL LOW (ref >=10)

## 2024-05-26 LAB — PSA: PSA: 0.37 ng/mL (ref 0.10–4.00)

## 2024-05-26 NOTE — Addendum Note (Signed)
 Addended by: DORINA DALLAS HERO on: 05/26/2024 07:39 PM   Modules accepted: Orders

## 2024-05-27 ENCOUNTER — Telehealth: Payer: Self-pay

## 2024-05-27 ENCOUNTER — Other Ambulatory Visit (INDEPENDENT_AMBULATORY_CARE_PROVIDER_SITE_OTHER)

## 2024-05-27 DIAGNOSIS — D649 Anemia, unspecified: Secondary | ICD-10-CM

## 2024-05-27 LAB — IBC + FERRITIN
Ferritin: 5.9 ng/mL — ABNORMAL LOW (ref 22.0–322.0)
Iron: 31 ug/dL — ABNORMAL LOW (ref 42–165)
Saturation Ratios: 6.1 % — ABNORMAL LOW (ref 20.0–50.0)
TIBC: 509.6 ug/dL — ABNORMAL HIGH (ref 250.0–450.0)
Transferrin: 364 mg/dL — ABNORMAL HIGH (ref 212.0–360.0)

## 2024-05-27 NOTE — Telephone Encounter (Signed)
 Iron  panel added , add on sheet faxed to [facility]

## 2024-05-27 NOTE — Telephone Encounter (Signed)
-----   Message from George Ward sent at 05/26/2024  7:40 PM EDT ----- Will you get lab to add on iron  panel for this pt.

## 2024-05-28 ENCOUNTER — Telehealth: Payer: Self-pay

## 2024-05-28 ENCOUNTER — Ambulatory Visit: Payer: Self-pay | Admitting: Medical

## 2024-05-28 MED ORDER — IRON (FERROUS SULFATE) 325 (65 FE) MG PO TABS: 325.0000 mg | ORAL_TABLET | Freq: Every day | ORAL | 3 refills | Status: DC

## 2024-05-28 NOTE — Addendum Note (Signed)
 Addended by: DORINA DALLAS HERO on: 05/28/2024 06:26 AM   Modules accepted: Orders

## 2024-05-28 NOTE — Telephone Encounter (Signed)
 Orders for stool card and hep b vaccine are in the pts chart  Called pt and have him scheduled for nv hep b vaccine on 9/25   Copied from CRM #[id number]. Topic: Clinical - Request for Lab/Test Order >> May 28, 2024  2:15 PM Viola F wrote: Patient needs orders put in for hep b vaccine and stool card to check for blood in stool. Please call him to schedule once orders are in [phone] (M)

## 2024-05-28 NOTE — Addendum Note (Signed)
 Addended by: DORINA DALLAS HERO on: 05/28/2024 06:31 AM   Modules accepted: Orders

## 2024-05-29 ENCOUNTER — Other Ambulatory Visit: Payer: Self-pay

## 2024-05-30 ENCOUNTER — Other Ambulatory Visit: Payer: Self-pay | Admitting: Medical

## 2024-06-08 ENCOUNTER — Ambulatory Visit

## 2024-06-26 ENCOUNTER — Other Ambulatory Visit: Payer: Self-pay | Admitting: Sports Medicine

## 2024-06-29 ENCOUNTER — Encounter: Payer: Self-pay | Admitting: Sports Medicine

## 2024-07-17 ENCOUNTER — Other Ambulatory Visit: Payer: Self-pay | Admitting: Medical

## 2024-07-20 ENCOUNTER — Other Ambulatory Visit: Payer: Self-pay | Admitting: Medical

## 2024-07-25 ENCOUNTER — Other Ambulatory Visit: Payer: Self-pay | Admitting: Medical

## 2024-08-20 ENCOUNTER — Other Ambulatory Visit: Payer: Self-pay | Admitting: Medical

## 2024-08-23 ENCOUNTER — Other Ambulatory Visit: Payer: Self-pay | Admitting: Medical

## 2024-08-24 ENCOUNTER — Ambulatory Visit: Payer: Self-pay

## 2024-08-24 ENCOUNTER — Encounter: Payer: Self-pay | Admitting: Family Medicine

## 2024-08-24 ENCOUNTER — Encounter: Payer: Self-pay | Admitting: Medical

## 2024-08-24 ENCOUNTER — Ambulatory Visit: Admitting: Family Medicine

## 2024-08-24 VITALS — BP 132/76 | HR 76 | Temp 97.8°F | Ht 70.0 in | Wt 203.0 lb

## 2024-08-24 DIAGNOSIS — J4540 Moderate persistent asthma, uncomplicated: Secondary | ICD-10-CM

## 2024-08-24 DIAGNOSIS — J069 Acute upper respiratory infection, unspecified: Secondary | ICD-10-CM | POA: Insufficient documentation

## 2024-08-24 DIAGNOSIS — J029 Acute pharyngitis, unspecified: Secondary | ICD-10-CM | POA: Insufficient documentation

## 2024-08-24 DIAGNOSIS — J101 Influenza due to other identified influenza virus with other respiratory manifestations: Secondary | ICD-10-CM | POA: Insufficient documentation

## 2024-08-24 DIAGNOSIS — R0602 Shortness of breath: Secondary | ICD-10-CM

## 2024-08-24 DIAGNOSIS — R059 Cough, unspecified: Secondary | ICD-10-CM | POA: Insufficient documentation

## 2024-08-24 LAB — POCT INFLUENZA A/B
Influenza A, POC: POSITIVE — AB
Influenza B, POC: NEGATIVE

## 2024-08-24 LAB — POC COVID19 BINAXNOW: SARS Coronavirus 2 Ag: NEGATIVE

## 2024-08-24 MED ORDER — ALBUTEROL SULFATE HFA 108 (90 BASE) MCG/ACT IN AERS: 1.0000 | INHALATION_SPRAY | Freq: Four times a day (QID) | RESPIRATORY_TRACT | 2 refills | Status: AC | PRN

## 2024-08-24 MED ORDER — METHYLPREDNISOLONE 4 MG PO TBPK: ORAL_TABLET | ORAL | 0 refills | Status: AC

## 2024-08-24 MED ORDER — AMOXICILLIN-POT CLAVULANATE 875-125 MG PO TABS: 1.0000 | ORAL_TABLET | Freq: Two times a day (BID) | ORAL | 0 refills | Status: AC

## 2024-08-24 MED ORDER — OSELTAMIVIR PHOSPHATE 75 MG PO CAPS: 75.0000 mg | ORAL_CAPSULE | Freq: Two times a day (BID) | ORAL | 0 refills | Status: AC

## 2024-08-24 NOTE — Telephone Encounter (Signed)
° °  FYI Only or Action Required?: FYI only for provider: appointment scheduled on 08/24/24.  Patient was last seen in primary care on 05/25/2024 by Dorina Loving, PA-C.  Called Nurse Triage reporting Sore Throat, Cough, and Generalized Body Aches.  Symptoms began several days ago.  Interventions attempted: OTC medications: tylenol .  Symptoms are: gradually worsening.  Triage Disposition: See PCP When Office is Open (Within 3 Days)  Patient/caregiver understands and will follow disposition?: Yes Copied from CRM #[id number]. Topic: Clinical - Red Word Triage >> Aug 24, 2024  2:06 PM Dedra B wrote: Red Word that prompted transfer to Nurse Triage: Pt experiencing productive cough with yellow mucous, low grade fever, body aches. Warm transfer to NT. >> Aug 24, 2024  2:27 PM Dedra B wrote: Call disconnected. Attempted to reach pt and no answer. Reason for Disposition  [1] Sore throat with cough/cold symptoms AND [2] present > 5 days  Answer Assessment - Initial Assessment Questions 1. ONSET: When did the throat start hurting? (Hours or days ago)      Saturday, two days ago 2. SEVERITY: How bad is the sore throat? (Scale 1-10; mild, moderate or severe)     5/10 3. STREP EXPOSURE: Has there been any exposure to strep within the past week? If Yes, ask: What type of contact occurred?      Unsure- middle school teacher 4.  VIRAL SYMPTOMS: Are there any symptoms of a cold, such as a runny nose, cough, hoarse voice or red eyes?      Positive for runny nose 5. FEVER: Do you have a fever? If Yes, ask: What is your temperature, how was it measured, and when did it start?     99 plus chills 6. PUS ON THE TONSILS: Is there pus on the tonsils in the back of your throat?     None noted 7. OTHER SYMPTOMS: Do you have any other symptoms? (e.g., difficulty breathing, headache, rash)     Mild head ache, body aches  Protocols used: Sore Throat-A-AH

## 2024-08-24 NOTE — Addendum Note (Signed)
 Addended by: DORINA DALLAS HERO on: 08/24/2024 01:11 PM   Modules accepted: Orders

## 2024-08-24 NOTE — Assessment & Plan Note (Addendum)
 Low threshold to treat with steroids and antibiotics due to history.  Endorses shortness of breath, using rescue inhaler.  Lungs are clear in office.  Oxygen saturation 98%. No obvious shortness of breath.  Augmentin  875-125 mg BID for 7 days. Medrol  dose pack. Supportive therapy as instructed.

## 2024-08-24 NOTE — Progress Notes (Signed)
 Acute Office Visit  Subjective:     Patient ID: George Ward, male    DOB: [birth year], 53 y.o.   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  Symptoms present since Saturday. 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 No ear pain. Tried Alka Seltzer for sinus and fever: helped some. Struggling with sleep. History of pneumonia and bronchitis. POC Covid and flu today.  Endorses some SOB yesterday, used his rescue inhaler.    ROS      Objective:    BP 132/76 (BP Location: Left Arm, Patient Position: Sitting, Cuff Size: Normal)   Pulse 76   Temp 97.8 F (36.6 C) (Oral)   Ht 5' 10 (1.778 m)   Wt 203 lb (92.1 kg)   SpO2 98%   BMI 29.13 kg/m    Physical Exam Vitals and nursing note reviewed.  Constitutional:      General: He is not in acute distress.    Appearance: He is well-developed. He is ill-appearing. He is not toxic-appearing.  Cardiovascular:     Rate and Rhythm: Normal rate and regular rhythm.     Heart sounds: Normal heart sounds.  Pulmonary:     Effort: Pulmonary effort is normal.     Breath sounds: Normal breath sounds.  Skin:    General: Skin is warm and dry.  Neurological:     General: No focal deficit present.     Mental Status: He is alert. Mental status is at baseline.  Psychiatric:        Mood and Affect: Mood normal.        Behavior: Behavior normal.        Thought Content: Thought content normal.        Judgment: Judgment normal.     Results for orders placed or performed in visit on 08/24/24  POC COVID-19 BinaxNow  Result Value Ref Range   SARS Coronavirus 2 Ag Negative Negative  POCT Influenza A/B  Result Value  Ref Range   Influenza A, POC Positive (A) Negative   Influenza B, POC Negative Negative        Assessment & Plan:   Problem List Items Addressed This Visit     Shortness of breath   Relevant Medications   methylPREDNISolone  (MEDROL  DOSEPAK) 4 MG TBPK tablet   Asthma   Low threshold to treat with steroids and antibiotics due to history.  Endorses shortness of breath, using rescue inhaler.  Lungs are clear in office.  Oxygen saturation 98%. No obvious shortness of breath.  Augmentin  875-125 mg BID for 7 days. Medrol  dose pack. Supportive therapy as instructed.        Relevant Medications   methylPREDNISolone  (MEDROL  DOSEPAK) 4 MG TBPK tablet   amoxicillin -clavulanate (AUGMENTIN ) 875-125 MG tablet   Cough   Relevant Orders   POC COVID-19 BinaxNow (Completed)   POCT Influenza A/B (Completed)   Sore throat   Relevant Orders   POC COVID-19 BinaxNow (Completed)   POCT Influenza A/B (Completed)   Influenza A (H1N1) - Primary   POC influenza positive for flu A  Tamilflu 75 mg BID for 5 days. Supportive therapy. Symptoms started on Saturday, may return to work after 5 days on 08/27/24.        Relevant Medications   oseltamivir  (TAMIFLU ) 75 MG capsule    Meds ordered this encounter  Medications   methylPREDNISolone  (MEDROL  DOSEPAK) 4 MG TBPK tablet    Sig: 6-day pack as directed    Dispense:  21 tablet    Refill:  0    Supervising Provider:   METHENEY, CATHERINE D [[id number]]   amoxicillin -clavulanate (AUGMENTIN ) 875-125 MG tablet    Sig: Take 1 tablet by mouth 2 (two) times daily.    Dispense:  14 tablet    Refill:  0    Supervising Provider:   METHENEY, CATHERINE D [[id number]]   oseltamivir  (TAMIFLU ) 75 MG capsule    Sig: Take 1 capsule (75 mg total) by mouth 2 (two) times daily.    Dispense:  10 capsule    Refill:  0    Supervising Provider:   METHENEY, CATHERINE D [[id number]]  Agrees with plan of care discussed.  Questions answered.   Return if symptoms worsen or fail  to improve.  Darice JONELLE Brownie, FNP

## 2024-08-24 NOTE — Telephone Encounter (Signed)
 Appt scheduled

## 2024-08-24 NOTE — Assessment & Plan Note (Signed)
 POC influenza positive for flu A Tamilflu 75 mg BID for 5 days. Supportive therapy. Symptoms started on Saturday, may return to work after 5 days on 08/27/24.

## 2024-08-26 ENCOUNTER — Encounter: Payer: Self-pay | Admitting: Family Medicine

## 2024-08-27 ENCOUNTER — Other Ambulatory Visit: Payer: Self-pay | Admitting: Medical

## 2024-09-25 ENCOUNTER — Other Ambulatory Visit: Payer: Self-pay | Admitting: Cardiology

## 2024-09-26 ENCOUNTER — Telehealth: Admitting: Physician Assistant

## 2024-09-26 DIAGNOSIS — J329 Chronic sinusitis, unspecified: Secondary | ICD-10-CM | POA: Diagnosis not present

## 2024-09-26 MED ORDER — DOXYCYCLINE HYCLATE 100 MG PO TABS: 100.0000 mg | ORAL_TABLET | Freq: Two times a day (BID) | ORAL | 0 refills | Status: AC

## 2024-09-26 MED ORDER — FLUTICASONE PROPIONATE 50 MCG/ACT NA SUSP: 2.0000 | Freq: Every day | NASAL | 1 refills | Status: AC

## 2024-09-26 MED ORDER — LEVOCETIRIZINE DIHYDROCHLORIDE 5 MG PO TABS: 5.0000 mg | ORAL_TABLET | Freq: Every evening | ORAL | 0 refills | Status: AC

## 2024-09-26 NOTE — Progress Notes (Signed)
 E-Visit for Sinus Problems  We are sorry that you are not feeling well.  Here is how we plan to help!  Based on what you have shared with me it looks like you have sinusitis.  Sinusitis is inflammation and infection in the sinus cavities of the head.  Based on your presentation I believe you most likely have Acute Bacterial Sinusitis.  This is an infection caused by bacteria and is treated with antibiotics. I have prescribed Doxycycline  100mg  by mouth twice a day for 7 days., I have also prescribed Flonase  Nasal Spray Use 2 sprays in each nostril daily for 10-14 days, and Levocetirizine 10mg  Take 1 tablet at bedtime You may use an oral decongestant such as Mucinex D or if you have glaucoma or high blood pressure use plain Mucinex. Saline nasal spray help and can safely be used as often as needed for congestion.  If you develop worsening sinus pain, fever or notice severe headache and vision changes, or if symptoms are not better after completion of antibiotic, please schedule an appointment with a health care provider.    Sinus infections are not as easily transmitted as other respiratory infection, however we still recommend that you avoid close contact with loved ones, especially the very young and elderly.  Remember to wash your hands thoroughly throughout the day as this is the number one way to prevent the spread of infection!  Home Care: Only take medications as instructed by your medical team. Complete the entire course of an antibiotic. Do not take these medications with alcohol. A steam or ultrasonic humidifier can help congestion.  You can place a towel over your head and breathe in the steam from hot water coming from a faucet. Avoid close contacts especially the very young and the elderly. Cover your mouth when you cough or sneeze. Always remember to wash your hands.  Get Help Right Away If: You develop worsening fever or sinus pain. You develop a severe head ache or visual  changes. Your symptoms persist after you have completed your treatment plan.  Make sure you Understand these instructions. Will watch your condition. Will get help right away if you are not doing well or get worse.  Your e-visit answers were reviewed by a board certified advanced clinical practitioner to complete your personal care plan.  Depending on the condition, your plan could have included both over the counter or prescription medications.  If there is a problem please reply  once you have received a response from your provider.  Your safety is important to us .  If you have drug allergies check your prescription carefully.    You can use MyChart to ask questions about today's visit, request a non-urgent call back, or ask for a work or school excuse for 24 hours related to this e-Visit. If it has been greater than 24 hours you will need to follow up with your provider, or enter a new e-Visit to address those concerns.  You will get an e-mail in the next two days asking about your experience.  I hope that your e-visit has been valuable and will speed your recovery. Thank you for using e-visits.  I have spent 5 minutes in review of e-visit questionnaire, review and updating patient chart, medical decision making and response to patient.   Teena Shuck, PA-C

## 2024-12-08 ENCOUNTER — Ambulatory Visit: Admitting: Cardiology
